# Patient Record
Sex: Male | Born: 1955 | Race: White | Hispanic: No | Marital: Single | Smoking: Former smoker
Health system: Southern US, Community
[De-identification: ages and names within clinical notes are randomized; demographics above are authoritative.]

## PROBLEM LIST (undated history)

## (undated) DIAGNOSIS — I10 Essential (primary) hypertension: Secondary | ICD-10-CM

## (undated) DIAGNOSIS — G629 Polyneuropathy, unspecified: Secondary | ICD-10-CM

## (undated) DIAGNOSIS — D649 Anemia, unspecified: Secondary | ICD-10-CM

## (undated) DIAGNOSIS — I2510 Atherosclerotic heart disease of native coronary artery without angina pectoris: Secondary | ICD-10-CM

## (undated) DIAGNOSIS — K219 Gastro-esophageal reflux disease without esophagitis: Secondary | ICD-10-CM

## (undated) DIAGNOSIS — M109 Gout, unspecified: Secondary | ICD-10-CM

## (undated) DIAGNOSIS — M2550 Pain in unspecified joint: Secondary | ICD-10-CM

## (undated) DIAGNOSIS — N183 Chronic kidney disease, stage 3 unspecified: Secondary | ICD-10-CM

## (undated) DIAGNOSIS — F32A Depression, unspecified: Secondary | ICD-10-CM

## (undated) DIAGNOSIS — R0600 Dyspnea, unspecified: Secondary | ICD-10-CM

## (undated) DIAGNOSIS — E785 Hyperlipidemia, unspecified: Secondary | ICD-10-CM

## (undated) DIAGNOSIS — R609 Edema, unspecified: Secondary | ICD-10-CM

## (undated) DIAGNOSIS — F329 Major depressive disorder, single episode, unspecified: Secondary | ICD-10-CM

## (undated) DIAGNOSIS — M1990 Unspecified osteoarthritis, unspecified site: Secondary | ICD-10-CM

## (undated) DIAGNOSIS — N289 Disorder of kidney and ureter, unspecified: Secondary | ICD-10-CM

## (undated) DIAGNOSIS — E118 Type 2 diabetes mellitus with unspecified complications: Secondary | ICD-10-CM

## (undated) DIAGNOSIS — E291 Testicular hypofunction: Secondary | ICD-10-CM

## (undated) DIAGNOSIS — C801 Malignant (primary) neoplasm, unspecified: Secondary | ICD-10-CM

## (undated) DIAGNOSIS — R809 Proteinuria, unspecified: Secondary | ICD-10-CM

## (undated) DIAGNOSIS — I5030 Unspecified diastolic (congestive) heart failure: Secondary | ICD-10-CM

## (undated) DIAGNOSIS — R252 Cramp and spasm: Secondary | ICD-10-CM

## (undated) DIAGNOSIS — E1129 Type 2 diabetes mellitus with other diabetic kidney complication: Secondary | ICD-10-CM

## (undated) DIAGNOSIS — E1140 Type 2 diabetes mellitus with diabetic neuropathy, unspecified: Secondary | ICD-10-CM

## (undated) DIAGNOSIS — N400 Enlarged prostate without lower urinary tract symptoms: Secondary | ICD-10-CM

## (undated) HISTORY — DX: Pain in unspecified joint: M25.50

## (undated) HISTORY — DX: Unspecified osteoarthritis, unspecified site: M19.90

## (undated) HISTORY — DX: Cramp and spasm: R25.2

## (undated) HISTORY — DX: Chronic kidney disease, stage 3 unspecified: N18.30

## (undated) HISTORY — DX: Atherosclerotic heart disease of native coronary artery without angina pectoris: I25.10

## (undated) HISTORY — DX: Benign prostatic hyperplasia without lower urinary tract symptoms: N40.0

## (undated) HISTORY — DX: Essential (primary) hypertension: I10

## (undated) HISTORY — DX: Edema, unspecified: R60.9

## (undated) HISTORY — DX: Gout, unspecified: M10.9

## (undated) HISTORY — DX: Polyneuropathy, unspecified: G62.9

## (undated) HISTORY — DX: Type 2 diabetes mellitus with unspecified complications: E11.8

## (undated) HISTORY — DX: Hyperlipidemia, unspecified: E78.5

## (undated) HISTORY — DX: Type 2 diabetes mellitus with other diabetic kidney complication: R80.9

## (undated) HISTORY — DX: Unspecified diastolic (congestive) heart failure: I50.30

## (undated) HISTORY — DX: Type 2 diabetes mellitus with diabetic neuropathy, unspecified: E11.40

## (undated) HISTORY — PX: CATARACT EXTRACTION: SUR2

## (undated) HISTORY — DX: Testicular hypofunction: E29.1

## (undated) HISTORY — DX: Disorder of kidney and ureter, unspecified: N28.9

## (undated) HISTORY — DX: Gastro-esophageal reflux disease without esophagitis: K21.9

## (undated) HISTORY — DX: Malignant (primary) neoplasm, unspecified: C80.1

## (undated) HISTORY — DX: Proteinuria, unspecified: E11.29

## (undated) HISTORY — DX: Depression, unspecified: F32.A

## (undated) HISTORY — DX: Anemia, unspecified: D64.9

---
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
## 1979-09-28 HISTORY — PX: CHOLECYSTECTOMY: SHX55

## 1995-09-28 HISTORY — PX: KIDNEY STONE SURGERY: SHX686

## 2009-02-10 ENCOUNTER — Emergency Department (HOSPITAL_COMMUNITY): Admission: EM | Admit: 2009-02-10 | Discharge: 2009-02-10 | Payer: Self-pay | Admitting: Emergency Medicine

## 2009-02-10 IMAGING — CT CT PELVIS W/O CM
2 of 3 series · 17 of 43 positions shown, 19 images · non-contrast
Comparison: None available.

CT ABDOMEN

CLINICAL DATA: Nausea.  Right lower quadrant pain.  History
urinary tract stones.

CT ABDOMEN AND PELVIS WITHOUT CONTRAST
TECHNIQUE: Multidetector CT imaging of the abdomen and pelvis was
performed following the standard protocol without intravenous
contrast.

[Series 4: lung windows · axial · 0.88mm/px · z∈[-170,-40]mm · 14 of 29 slices shown, 16 images]
[im 2/29  soft-tissue]
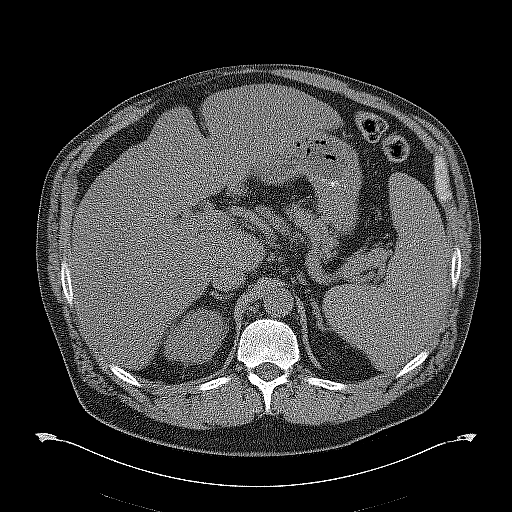
[im 2/29  bone]
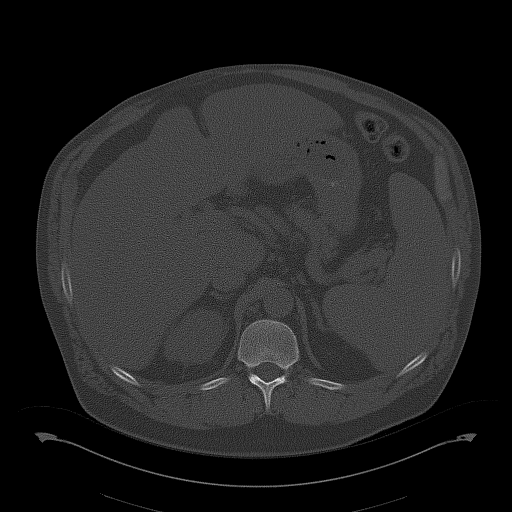
[im 4/29  soft-tissue]
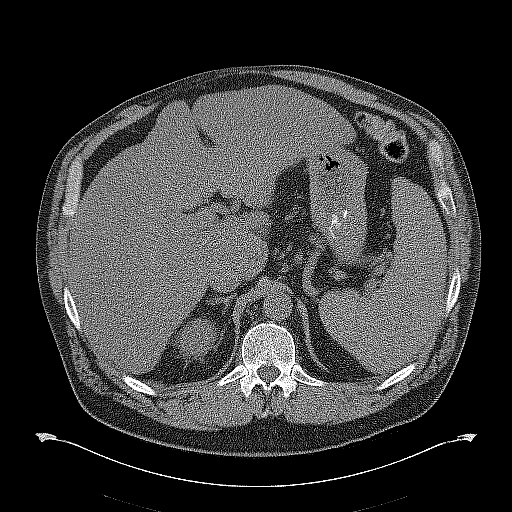
[im 6/29  soft-tissue]
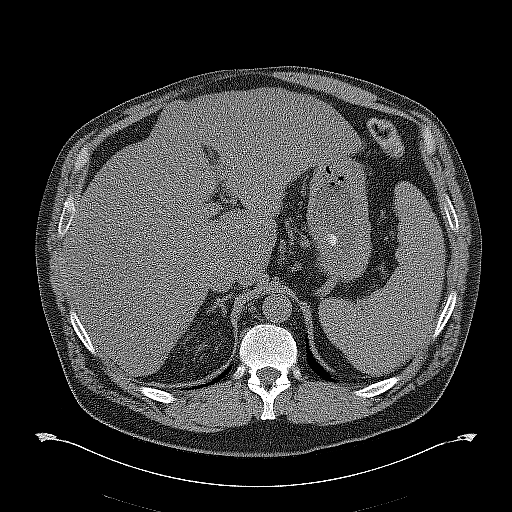
[im 8/29  soft-tissue]
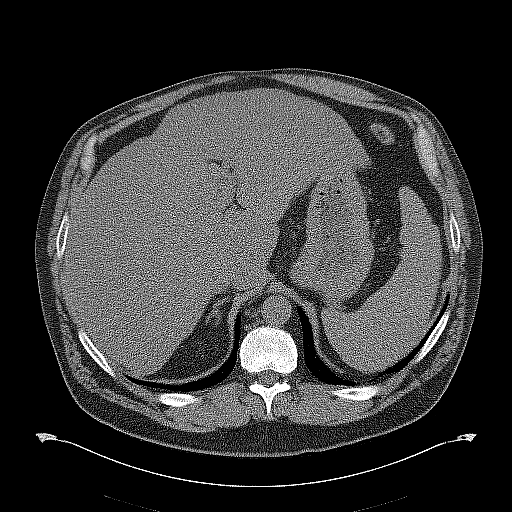
[im 10/29  soft-tissue]
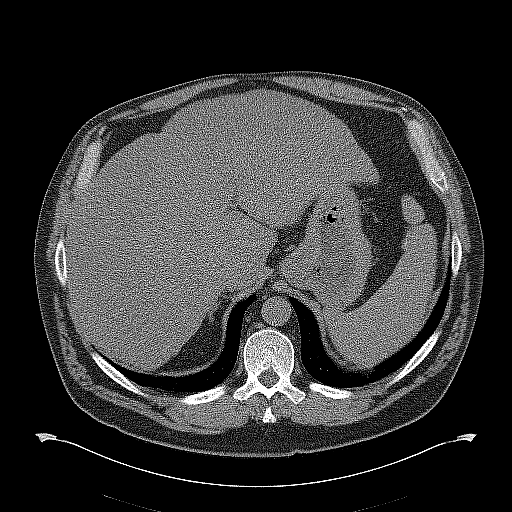
[im 12/29  soft-tissue]
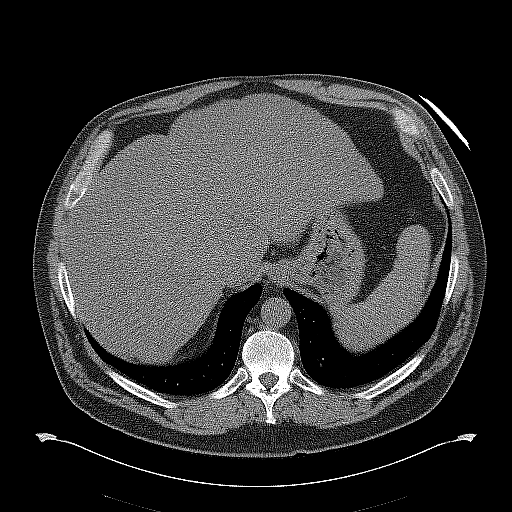
[im 14/29  soft-tissue]
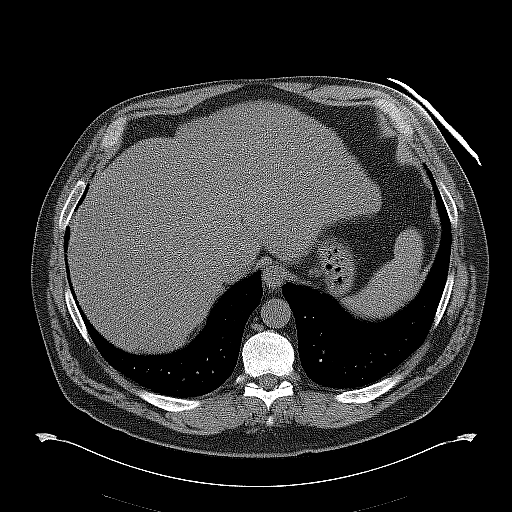
[im 16/29  soft-tissue]
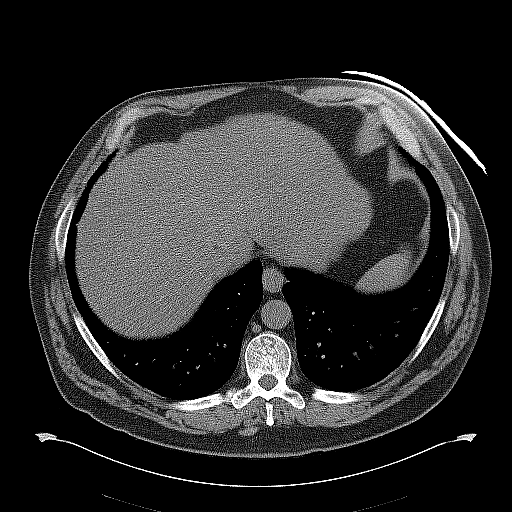
[im 18/29  soft-tissue]
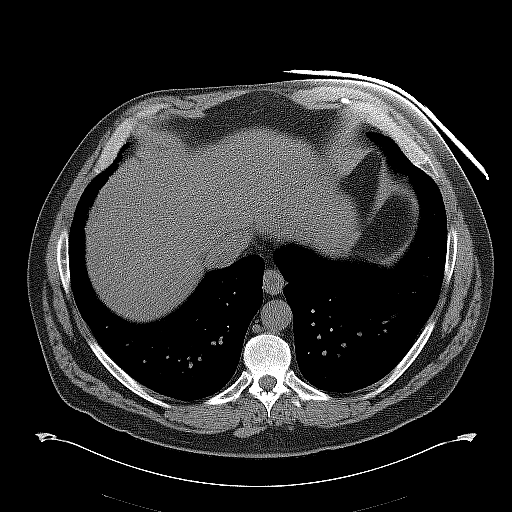
[im 18/29  bone]
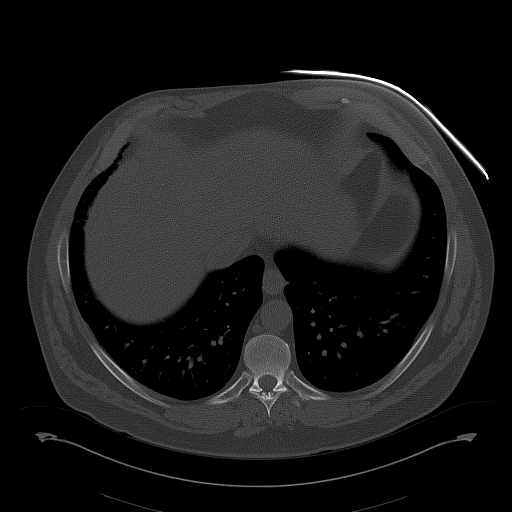
[im 20/29  soft-tissue]
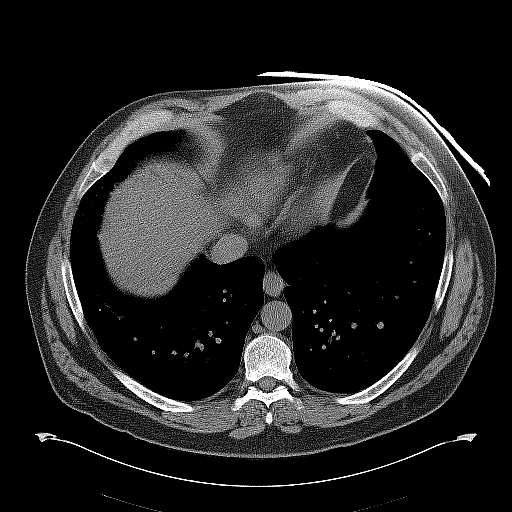
[im 22/29  soft-tissue]
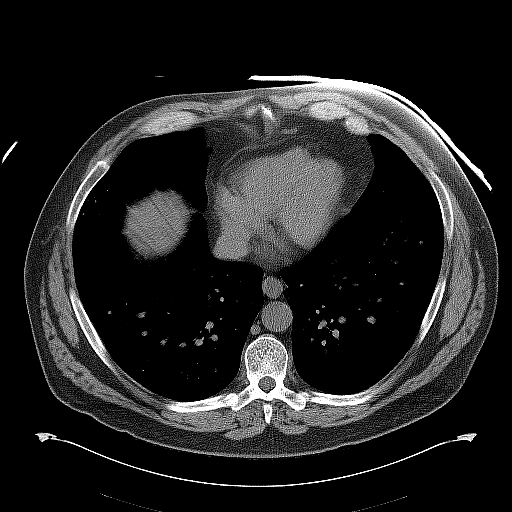
[im 24/29  soft-tissue]
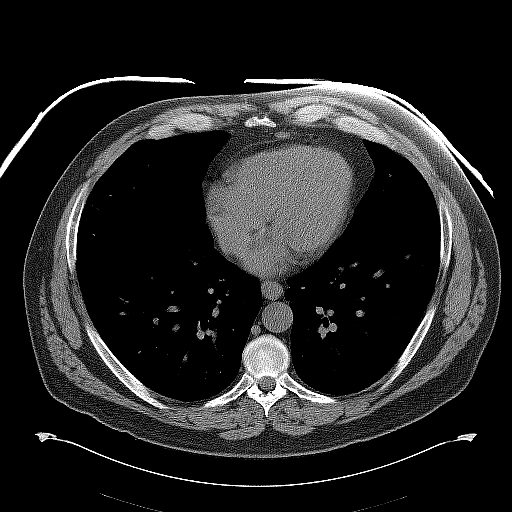
[im 26/29  soft-tissue]
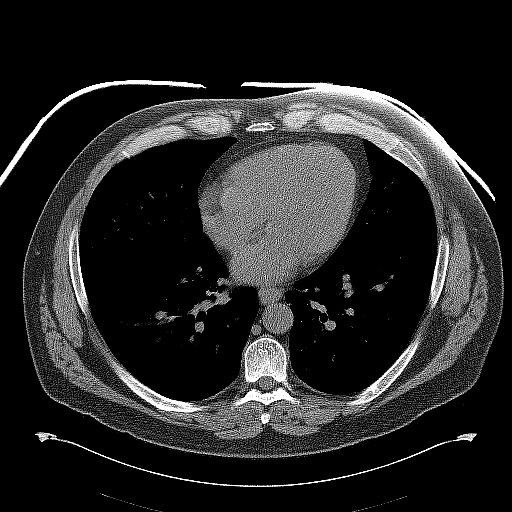
[im 28/29  soft-tissue]
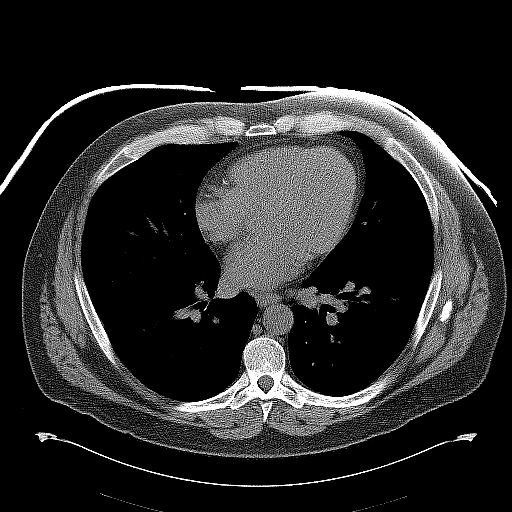

[Series 602: coronal images · coronal · 0.99mm/px · 3 of 121 slices shown]
[im 41/121  soft-tissue]
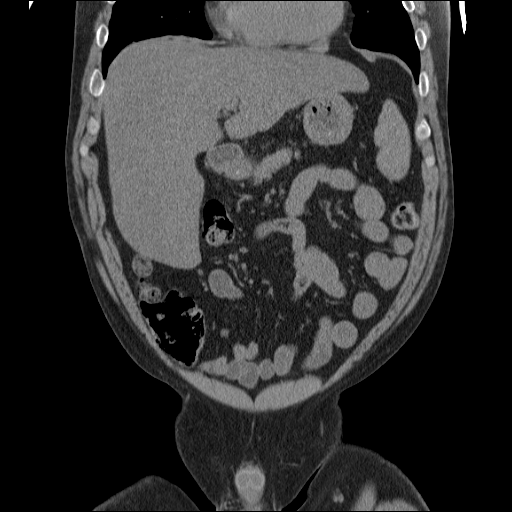
[im 54/121  soft-tissue]
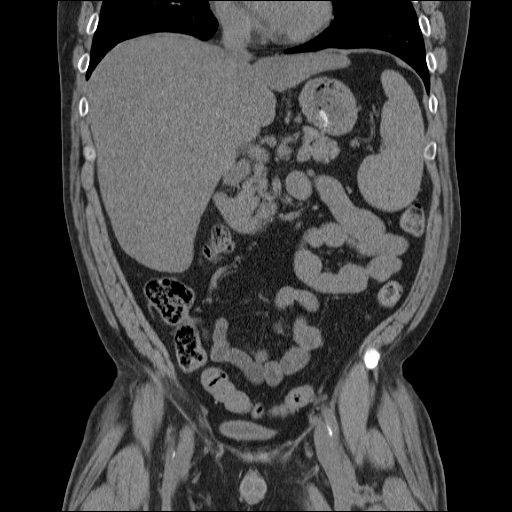
[im 67/121  soft-tissue]
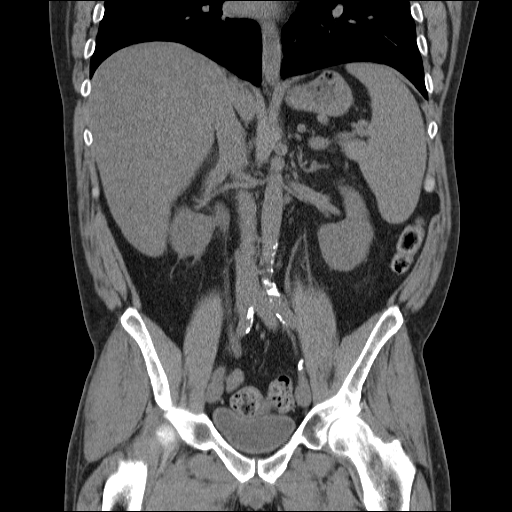

[17 of 43 positions shown; findings below may reference images not displayed]

FINDINGS: The lung bases are clear.  No pleural or pericardial
effusion.

There is moderate right hydroureteronephrosis due to a distal right
ureteral stone measuring 0.6 cm lying approximately 4 cm proximal
to the UVJ.  The patient has multiple additional nonobstructing
bilateral renal stones with approximately six on the right and five
on the left.  The largest is in the upper pole right kidney
measuring 0.6 cm.

The liver has a slightly nodular border and is enlarged measuring
24 cm cranial-caudal.  No focal liver lesion on uninfused scan.
The gallbladder has been removed.  The biliary tree, adrenal
glands, spleen and pancreas are all unremarkable.  Some small
abdominal lymph nodes are noted including a gastrohepatic ligament
lymph node on image 33 measures 1.6 cm.  Scattered atherosclerotic
calcification is seen in a nonaneurysmal aorta.  There is no focal
bony abnormality with degenerative disc disease seen the lumbar
spine most notable at L3-4.
IMPRESSION: 1.  Moderate right hydronephrosis due to a 0.6 cm distal right
ureteral stone.
2.  Multiple bilateral nonobstructing renal stones.
3.  Hepatomegaly with a slightly nodular border of the liver
suggesting cirrhosis.
4.  Atherosclerotic vascular disease.

CT PELVIS
FINDINGS: The urinary bladder, prostate gland and seminal vesicles
are unremarkable.  No pelvic fluid or lymphadenopathy.  The colon
has a normal CT appearance.  The appendix is not discretely
visualized.  No inflammatory change is seen in the right lower
quadrant.  No focal bony abnormality.
IMPRESSION: Distal right ureteral stone is again noted.  The pelvis is
otherwise negative.

## 2011-01-05 LAB — CBC
Hemoglobin: 13.3 g/dL (ref 13.0–17.0)
MCHC: 34.9 g/dL (ref 30.0–36.0)
MCV: 94.4 fL (ref 78.0–100.0)
RBC: 4.02 MIL/uL — ABNORMAL LOW (ref 4.22–5.81)
WBC: 7.1 10*3/uL (ref 4.0–10.5)

## 2011-01-05 LAB — BASIC METABOLIC PANEL
CO2: 22 mEq/L (ref 19–32)
Chloride: 102 mEq/L (ref 96–112)
GFR calc Af Amer: >60 mL/min (ref >60)
Sodium: 133 mEq/L — ABNORMAL LOW (ref 135–145)

## 2011-01-05 LAB — DIFFERENTIAL
Basophils Relative: 1 % (ref 0–1)
Eosinophils Absolute: 0.1 10*3/uL (ref 0.0–0.7)
Lymphs Abs: 1.5 10*3/uL (ref 0.7–4.0)
Monocytes Absolute: 0.4 10*3/uL (ref 0.1–1.0)
Monocytes Relative: 5 % (ref 3–12)
Neutro Abs: 5.1 10*3/uL (ref 1.7–7.7)

## 2011-01-05 LAB — URINALYSIS, ROUTINE W REFLEX MICROSCOPIC
Bilirubin Urine: NEGATIVE
Ketones, ur: NEGATIVE mg/dL
Protein, ur: 100 mg/dL — AB
Urobilinogen, UA: 1 mg/dL (ref 0.0–1.0)

## 2015-01-02 DIAGNOSIS — D61818 Other pancytopenia: Secondary | ICD-10-CM

## 2015-01-02 HISTORY — DX: Other pancytopenia: D61.818

## 2017-10-29 DIAGNOSIS — F0390 Unspecified dementia without behavioral disturbance: Secondary | ICD-10-CM

## 2017-10-29 DIAGNOSIS — E039 Hypothyroidism, unspecified: Secondary | ICD-10-CM

## 2017-10-29 DIAGNOSIS — K766 Portal hypertension: Secondary | ICD-10-CM

## 2017-10-29 DIAGNOSIS — I130 Hypertensive heart and chronic kidney disease with heart failure and stage 1 through stage 4 chronic kidney disease, or unspecified chronic kidney disease: Secondary | ICD-10-CM

## 2017-10-29 DIAGNOSIS — R569 Unspecified convulsions: Secondary | ICD-10-CM

## 2017-10-29 DIAGNOSIS — N179 Acute kidney failure, unspecified: Secondary | ICD-10-CM

## 2017-10-29 DIAGNOSIS — E785 Hyperlipidemia, unspecified: Secondary | ICD-10-CM

## 2017-10-29 DIAGNOSIS — I5031 Acute diastolic (congestive) heart failure: Secondary | ICD-10-CM

## 2017-10-29 DIAGNOSIS — J9691 Respiratory failure, unspecified with hypoxia: Secondary | ICD-10-CM

## 2017-10-29 DIAGNOSIS — E1122 Type 2 diabetes mellitus with diabetic chronic kidney disease: Secondary | ICD-10-CM

## 2017-10-29 DIAGNOSIS — J111 Influenza due to unidentified influenza virus with other respiratory manifestations: Secondary | ICD-10-CM

## 2017-10-29 DIAGNOSIS — K921 Melena: Secondary | ICD-10-CM

## 2017-10-30 DIAGNOSIS — R0602 Shortness of breath: Secondary | ICD-10-CM

## 2018-09-28 DIAGNOSIS — Z794 Long term (current) use of insulin: Secondary | ICD-10-CM | POA: Diagnosis not present

## 2018-09-28 DIAGNOSIS — E113593 Type 2 diabetes mellitus with proliferative diabetic retinopathy without macular edema, bilateral: Secondary | ICD-10-CM | POA: Diagnosis not present

## 2018-10-13 DIAGNOSIS — L300 Nummular dermatitis: Secondary | ICD-10-CM | POA: Diagnosis not present

## 2018-10-13 DIAGNOSIS — C44519 Basal cell carcinoma of skin of other part of trunk: Secondary | ICD-10-CM | POA: Diagnosis not present

## 2018-10-13 DIAGNOSIS — L821 Other seborrheic keratosis: Secondary | ICD-10-CM | POA: Diagnosis not present

## 2018-10-13 DIAGNOSIS — L570 Actinic keratosis: Secondary | ICD-10-CM | POA: Diagnosis not present

## 2018-10-16 ENCOUNTER — Ambulatory Visit (INDEPENDENT_AMBULATORY_CARE_PROVIDER_SITE_OTHER): Payer: Commercial Managed Care - PPO | Admitting: Podiatry

## 2018-10-16 ENCOUNTER — Other Ambulatory Visit: Payer: Self-pay

## 2018-10-16 ENCOUNTER — Encounter: Payer: Self-pay | Admitting: Podiatry

## 2018-10-16 VITALS — BP 106/67 | HR 79 | Resp 16 | Ht 70.0 in | Wt 240.0 lb

## 2018-10-16 DIAGNOSIS — M7741 Metatarsalgia, right foot: Secondary | ICD-10-CM | POA: Diagnosis not present

## 2018-10-16 DIAGNOSIS — B351 Tinea unguium: Secondary | ICD-10-CM | POA: Diagnosis not present

## 2018-10-16 DIAGNOSIS — M7742 Metatarsalgia, left foot: Secondary | ICD-10-CM

## 2018-10-16 DIAGNOSIS — M79676 Pain in unspecified toe(s): Secondary | ICD-10-CM | POA: Diagnosis not present

## 2018-10-16 DIAGNOSIS — M79609 Pain in unspecified limb: Secondary | ICD-10-CM

## 2018-10-16 DIAGNOSIS — E119 Type 2 diabetes mellitus without complications: Secondary | ICD-10-CM | POA: Diagnosis not present

## 2018-10-16 NOTE — Progress Notes (Signed)
  Subjective:  Patient ID: Nicholas Rice, male    DOB: July 01, 1956,  MRN: [id number]  Chief Complaint  Patient presents with  . debride    BL nail trimming and foot examination  . Diabetes    FBS: 165 x 1 day A1C: >7 PCP: Lee x 1 mo    63 y.o. male presents  for diabetic foot care. Last AMBS was 165. Denies numbness and tingling in their feet. Reports cramping in legs and thighs.  Review of Systems: Negative except as noted in the HPI. Denies N/V/F/Ch.  No past medical history on file.  Current Outpatient Medications:  .  atorvastatin (LIPITOR) 40 MG tablet, Take 40 mg by mouth daily., Disp: , Rfl:  .  furosemide (LASIX) 40 MG tablet, Take 40 mg by mouth 2 (two) times daily., Disp: , Rfl:  .  glipiZIDE (GLUCOTROL XL) 10 MG 24 hr tablet, Take 10 mg by mouth 2 (two) times daily., Disp: , Rfl:  .  lisinopril-hydrochlorothiazide (PRINZIDE,ZESTORETIC) 20-12.5 MG tablet, Take 1 tablet by mouth daily., Disp: , Rfl:  .  meloxicam (MOBIC) 15 MG tablet, TAKE 1 TABLET BY MOUTH ONCE DAILY WITH MEALS AS NEEDED, Disp: , Rfl:  .  metFORMIN (GLUCOPHAGE) 850 MG tablet, Take 850 mg by mouth 3 (three) times daily., Disp: , Rfl:  .  NOVOLIN N RELION 100 UNIT/ML injection, INJECT 80 UNITS SUBCUTANEOUSLY TWICE DAILY, Disp: , Rfl:  .  pantoprazole (PROTONIX) 40 MG tablet, Take 40 mg by mouth daily., Disp: , Rfl:  .  tamsulosin (FLOMAX) 0.4 MG CAPS capsule, Take 0.4 mg by mouth daily., Disp: , Rfl:  .  traMADol (ULTRAM) 50 MG tablet, Take 50 mg by mouth 3 (three) times daily as needed., Disp: , Rfl:   Social History   Tobacco Use  Smoking Status Former Smoker  Smokeless Tobacco Never Used    Allergies  Allergen Reactions  . Loratadine Other (See Comments)   Objective:   Vitals:   10/16/18 1012  BP: 106/67  Pulse: 79  Resp: 16   Body mass index is 34.44 kg/m. Constitutional Well developed. Well nourished.  Vascular Dorsalis pedis pulses present 1+ bilaterally  Posterior tibial pulses  present 1+ bilaterally  Pedal hair growth diminished. Capillary refill normal to all digits.  No cyanosis or clubbing noted.  Neurologic Normal speech. Oriented to person, place, and time. Epicritic sensation to light touch grossly present bilaterally. Protective sensation with 5.07 monofilament  present bilaterally. Vibratory sensation present bilaterally.  Dermatologic Nails elongated, thickened, dystrophic. No open wounds. No skin lesions.  Orthopedic: Normal joint ROM without pain or crepitus bilaterally. No visible deformities. No bony tenderness.   Assessment:   1. Encounter for diabetic foot exam ([place])   2. Controlled type 2 diabetes mellitus without complication, without long-term current use of insulin (HCC)   3. Pain due to onychomycosis of nail   4. Metatarsalgia of both feet    Plan:  Patient was evaluated and treated and all questions answered.  Diabetes without complication, Onychomycosis -Educated on diabetic footcare. Diabetic risk level 0 -Nails x10 debrided sharply and manually with large nail nipper and rotary burr.   Procedure: Nail Debridement Rationale: pain Type of Debridement: manual, sharp debridement. Instrumentation: Nail nipper, rotary burr. Number of Nails: 10  Return in about 1 year (around 10/17/2019) for Annual DM Foot Exam.

## 2018-10-16 NOTE — Progress Notes (Signed)
   Subjective:    Patient ID: Nicholas Rice, male    DOB: 04/03/1956, 63 y.o.   MRN: [id number]  HPI    Review of Systems  All other systems reviewed and are negative.      Objective:   Physical Exam        Assessment & Plan:

## 2018-11-10 DIAGNOSIS — C44519 Basal cell carcinoma of skin of other part of trunk: Secondary | ICD-10-CM | POA: Diagnosis not present

## 2018-11-11 DIAGNOSIS — N183 Chronic kidney disease, stage 3 (moderate): Secondary | ICD-10-CM | POA: Diagnosis not present

## 2018-11-11 DIAGNOSIS — E1140 Type 2 diabetes mellitus with diabetic neuropathy, unspecified: Secondary | ICD-10-CM | POA: Diagnosis not present

## 2018-11-11 DIAGNOSIS — E1129 Type 2 diabetes mellitus with other diabetic kidney complication: Secondary | ICD-10-CM | POA: Diagnosis not present

## 2018-11-11 DIAGNOSIS — E291 Testicular hypofunction: Secondary | ICD-10-CM | POA: Diagnosis not present

## 2018-11-11 DIAGNOSIS — I10 Essential (primary) hypertension: Secondary | ICD-10-CM | POA: Diagnosis not present

## 2018-12-01 DIAGNOSIS — L578 Other skin changes due to chronic exposure to nonionizing radiation: Secondary | ICD-10-CM | POA: Diagnosis not present

## 2018-12-01 DIAGNOSIS — C44329 Squamous cell carcinoma of skin of other parts of face: Secondary | ICD-10-CM | POA: Diagnosis not present

## 2018-12-01 DIAGNOSIS — C44629 Squamous cell carcinoma of skin of left upper limb, including shoulder: Secondary | ICD-10-CM | POA: Diagnosis not present

## 2018-12-01 DIAGNOSIS — L570 Actinic keratosis: Secondary | ICD-10-CM | POA: Diagnosis not present

## 2018-12-06 DIAGNOSIS — C44629 Squamous cell carcinoma of skin of left upper limb, including shoulder: Secondary | ICD-10-CM | POA: Diagnosis not present

## 2018-12-11 DIAGNOSIS — L02414 Cutaneous abscess of left upper limb: Secondary | ICD-10-CM | POA: Diagnosis not present

## 2018-12-16 DIAGNOSIS — E1129 Type 2 diabetes mellitus with other diabetic kidney complication: Secondary | ICD-10-CM | POA: Diagnosis not present

## 2018-12-16 DIAGNOSIS — N183 Chronic kidney disease, stage 3 (moderate): Secondary | ICD-10-CM | POA: Diagnosis not present

## 2018-12-16 DIAGNOSIS — L0390 Cellulitis, unspecified: Secondary | ICD-10-CM | POA: Diagnosis not present

## 2018-12-22 DIAGNOSIS — E118 Type 2 diabetes mellitus with unspecified complications: Secondary | ICD-10-CM | POA: Diagnosis not present

## 2018-12-22 DIAGNOSIS — I10 Essential (primary) hypertension: Secondary | ICD-10-CM | POA: Diagnosis not present

## 2018-12-22 DIAGNOSIS — L0390 Cellulitis, unspecified: Secondary | ICD-10-CM | POA: Diagnosis not present

## 2018-12-22 DIAGNOSIS — M159 Polyosteoarthritis, unspecified: Secondary | ICD-10-CM | POA: Diagnosis not present

## 2018-12-22 DIAGNOSIS — E785 Hyperlipidemia, unspecified: Secondary | ICD-10-CM | POA: Diagnosis not present

## 2018-12-22 DIAGNOSIS — E1140 Type 2 diabetes mellitus with diabetic neuropathy, unspecified: Secondary | ICD-10-CM | POA: Diagnosis not present

## 2018-12-28 DIAGNOSIS — N183 Chronic kidney disease, stage 3 (moderate): Secondary | ICD-10-CM | POA: Diagnosis not present

## 2018-12-28 DIAGNOSIS — E1140 Type 2 diabetes mellitus with diabetic neuropathy, unspecified: Secondary | ICD-10-CM | POA: Diagnosis not present

## 2018-12-28 DIAGNOSIS — L0390 Cellulitis, unspecified: Secondary | ICD-10-CM | POA: Diagnosis not present

## 2019-01-02 DIAGNOSIS — N400 Enlarged prostate without lower urinary tract symptoms: Secondary | ICD-10-CM | POA: Diagnosis not present

## 2019-01-02 DIAGNOSIS — E1140 Type 2 diabetes mellitus with diabetic neuropathy, unspecified: Secondary | ICD-10-CM | POA: Diagnosis not present

## 2019-01-02 DIAGNOSIS — L0390 Cellulitis, unspecified: Secondary | ICD-10-CM | POA: Diagnosis not present

## 2019-12-27 DIAGNOSIS — E1140 Type 2 diabetes mellitus with diabetic neuropathy, unspecified: Secondary | ICD-10-CM | POA: Diagnosis not present

## 2019-12-27 DIAGNOSIS — N183 Chronic kidney disease, stage 3 unspecified: Secondary | ICD-10-CM | POA: Diagnosis not present

## 2019-12-27 DIAGNOSIS — R252 Cramp and spasm: Secondary | ICD-10-CM | POA: Diagnosis not present

## 2019-12-27 DIAGNOSIS — I2510 Atherosclerotic heart disease of native coronary artery without angina pectoris: Secondary | ICD-10-CM | POA: Diagnosis not present

## 2019-12-27 DIAGNOSIS — N400 Enlarged prostate without lower urinary tract symptoms: Secondary | ICD-10-CM | POA: Diagnosis not present

## 2019-12-27 DIAGNOSIS — N289 Disorder of kidney and ureter, unspecified: Secondary | ICD-10-CM | POA: Diagnosis not present

## 2019-12-27 DIAGNOSIS — I5030 Unspecified diastolic (congestive) heart failure: Secondary | ICD-10-CM | POA: Diagnosis not present

## 2019-12-27 DIAGNOSIS — F329 Major depressive disorder, single episode, unspecified: Secondary | ICD-10-CM | POA: Diagnosis not present

## 2019-12-27 DIAGNOSIS — E1129 Type 2 diabetes mellitus with other diabetic kidney complication: Secondary | ICD-10-CM | POA: Diagnosis not present

## 2020-01-10 DIAGNOSIS — I10 Essential (primary) hypertension: Secondary | ICD-10-CM | POA: Diagnosis not present

## 2020-01-10 DIAGNOSIS — R252 Cramp and spasm: Secondary | ICD-10-CM | POA: Diagnosis not present

## 2020-01-10 DIAGNOSIS — I2510 Atherosclerotic heart disease of native coronary artery without angina pectoris: Secondary | ICD-10-CM | POA: Diagnosis not present

## 2020-01-10 DIAGNOSIS — N289 Disorder of kidney and ureter, unspecified: Secondary | ICD-10-CM | POA: Diagnosis not present

## 2020-01-10 DIAGNOSIS — E118 Type 2 diabetes mellitus with unspecified complications: Secondary | ICD-10-CM | POA: Diagnosis not present

## 2020-01-10 DIAGNOSIS — E1140 Type 2 diabetes mellitus with diabetic neuropathy, unspecified: Secondary | ICD-10-CM | POA: Diagnosis not present

## 2020-01-10 DIAGNOSIS — K921 Melena: Secondary | ICD-10-CM | POA: Diagnosis not present

## 2020-01-10 DIAGNOSIS — N400 Enlarged prostate without lower urinary tract symptoms: Secondary | ICD-10-CM | POA: Diagnosis not present

## 2020-01-10 DIAGNOSIS — N183 Chronic kidney disease, stage 3 unspecified: Secondary | ICD-10-CM | POA: Diagnosis not present

## 2020-01-10 DIAGNOSIS — F329 Major depressive disorder, single episode, unspecified: Secondary | ICD-10-CM | POA: Diagnosis not present

## 2020-01-10 DIAGNOSIS — E1129 Type 2 diabetes mellitus with other diabetic kidney complication: Secondary | ICD-10-CM | POA: Diagnosis not present

## 2020-01-14 DIAGNOSIS — E1140 Type 2 diabetes mellitus with diabetic neuropathy, unspecified: Secondary | ICD-10-CM | POA: Diagnosis not present

## 2020-01-14 DIAGNOSIS — N183 Chronic kidney disease, stage 3 unspecified: Secondary | ICD-10-CM | POA: Diagnosis not present

## 2020-01-14 DIAGNOSIS — F329 Major depressive disorder, single episode, unspecified: Secondary | ICD-10-CM | POA: Diagnosis not present

## 2020-01-14 DIAGNOSIS — N400 Enlarged prostate without lower urinary tract symptoms: Secondary | ICD-10-CM | POA: Diagnosis not present

## 2020-01-14 DIAGNOSIS — K921 Melena: Secondary | ICD-10-CM | POA: Diagnosis not present

## 2020-01-14 DIAGNOSIS — N289 Disorder of kidney and ureter, unspecified: Secondary | ICD-10-CM | POA: Diagnosis not present

## 2020-01-14 DIAGNOSIS — R252 Cramp and spasm: Secondary | ICD-10-CM | POA: Diagnosis not present

## 2020-01-14 DIAGNOSIS — I2510 Atherosclerotic heart disease of native coronary artery without angina pectoris: Secondary | ICD-10-CM | POA: Diagnosis not present

## 2020-01-14 DIAGNOSIS — E1129 Type 2 diabetes mellitus with other diabetic kidney complication: Secondary | ICD-10-CM | POA: Diagnosis not present

## 2020-01-16 DIAGNOSIS — E1140 Type 2 diabetes mellitus with diabetic neuropathy, unspecified: Secondary | ICD-10-CM | POA: Diagnosis not present

## 2020-01-16 DIAGNOSIS — E1129 Type 2 diabetes mellitus with other diabetic kidney complication: Secondary | ICD-10-CM | POA: Diagnosis not present

## 2020-01-16 DIAGNOSIS — N400 Enlarged prostate without lower urinary tract symptoms: Secondary | ICD-10-CM | POA: Diagnosis not present

## 2020-01-16 DIAGNOSIS — N183 Chronic kidney disease, stage 3 unspecified: Secondary | ICD-10-CM | POA: Diagnosis not present

## 2020-01-16 DIAGNOSIS — I2510 Atherosclerotic heart disease of native coronary artery without angina pectoris: Secondary | ICD-10-CM | POA: Diagnosis not present

## 2020-01-16 DIAGNOSIS — F329 Major depressive disorder, single episode, unspecified: Secondary | ICD-10-CM | POA: Diagnosis not present

## 2020-01-16 DIAGNOSIS — E118 Type 2 diabetes mellitus with unspecified complications: Secondary | ICD-10-CM | POA: Diagnosis not present

## 2020-01-16 DIAGNOSIS — N289 Disorder of kidney and ureter, unspecified: Secondary | ICD-10-CM | POA: Diagnosis not present

## 2020-01-16 DIAGNOSIS — R252 Cramp and spasm: Secondary | ICD-10-CM | POA: Diagnosis not present

## 2020-01-17 DIAGNOSIS — H25812 Combined forms of age-related cataract, left eye: Secondary | ICD-10-CM | POA: Diagnosis not present

## 2020-01-17 DIAGNOSIS — H2512 Age-related nuclear cataract, left eye: Secondary | ICD-10-CM | POA: Diagnosis not present

## 2020-01-17 DIAGNOSIS — H25813 Combined forms of age-related cataract, bilateral: Secondary | ICD-10-CM | POA: Diagnosis not present

## 2020-01-17 DIAGNOSIS — Z01818 Encounter for other preprocedural examination: Secondary | ICD-10-CM | POA: Diagnosis not present

## 2020-01-17 DIAGNOSIS — E119 Type 2 diabetes mellitus without complications: Secondary | ICD-10-CM | POA: Diagnosis not present

## 2020-01-17 DIAGNOSIS — H25811 Combined forms of age-related cataract, right eye: Secondary | ICD-10-CM | POA: Diagnosis not present

## 2020-01-23 DIAGNOSIS — R809 Proteinuria, unspecified: Secondary | ICD-10-CM | POA: Diagnosis not present

## 2020-01-23 DIAGNOSIS — F329 Major depressive disorder, single episode, unspecified: Secondary | ICD-10-CM | POA: Diagnosis not present

## 2020-01-23 DIAGNOSIS — I2510 Atherosclerotic heart disease of native coronary artery without angina pectoris: Secondary | ICD-10-CM | POA: Diagnosis not present

## 2020-01-23 DIAGNOSIS — N183 Chronic kidney disease, stage 3 unspecified: Secondary | ICD-10-CM | POA: Diagnosis not present

## 2020-01-23 DIAGNOSIS — N400 Enlarged prostate without lower urinary tract symptoms: Secondary | ICD-10-CM | POA: Diagnosis not present

## 2020-01-23 DIAGNOSIS — N289 Disorder of kidney and ureter, unspecified: Secondary | ICD-10-CM | POA: Diagnosis not present

## 2020-01-23 DIAGNOSIS — R252 Cramp and spasm: Secondary | ICD-10-CM | POA: Diagnosis not present

## 2020-01-23 DIAGNOSIS — E118 Type 2 diabetes mellitus with unspecified complications: Secondary | ICD-10-CM | POA: Diagnosis not present

## 2020-01-23 DIAGNOSIS — E1129 Type 2 diabetes mellitus with other diabetic kidney complication: Secondary | ICD-10-CM | POA: Diagnosis not present

## 2020-01-24 DIAGNOSIS — H25812 Combined forms of age-related cataract, left eye: Secondary | ICD-10-CM | POA: Diagnosis not present

## 2020-02-07 DIAGNOSIS — E1129 Type 2 diabetes mellitus with other diabetic kidney complication: Secondary | ICD-10-CM | POA: Diagnosis not present

## 2020-02-07 DIAGNOSIS — R252 Cramp and spasm: Secondary | ICD-10-CM | POA: Diagnosis not present

## 2020-02-07 DIAGNOSIS — E1140 Type 2 diabetes mellitus with diabetic neuropathy, unspecified: Secondary | ICD-10-CM | POA: Diagnosis not present

## 2020-02-07 DIAGNOSIS — N400 Enlarged prostate without lower urinary tract symptoms: Secondary | ICD-10-CM | POA: Diagnosis not present

## 2020-02-07 DIAGNOSIS — F3341 Major depressive disorder, recurrent, in partial remission: Secondary | ICD-10-CM | POA: Diagnosis not present

## 2020-02-07 DIAGNOSIS — N289 Disorder of kidney and ureter, unspecified: Secondary | ICD-10-CM | POA: Diagnosis not present

## 2020-02-07 DIAGNOSIS — I2510 Atherosclerotic heart disease of native coronary artery without angina pectoris: Secondary | ICD-10-CM | POA: Diagnosis not present

## 2020-02-07 DIAGNOSIS — N183 Chronic kidney disease, stage 3 unspecified: Secondary | ICD-10-CM | POA: Diagnosis not present

## 2020-02-07 DIAGNOSIS — E118 Type 2 diabetes mellitus with unspecified complications: Secondary | ICD-10-CM | POA: Diagnosis not present

## 2020-02-21 DIAGNOSIS — Z6837 Body mass index (BMI) 37.0-37.9, adult: Secondary | ICD-10-CM | POA: Diagnosis not present

## 2020-02-21 DIAGNOSIS — E1129 Type 2 diabetes mellitus with other diabetic kidney complication: Secondary | ICD-10-CM | POA: Diagnosis not present

## 2020-02-21 DIAGNOSIS — E1140 Type 2 diabetes mellitus with diabetic neuropathy, unspecified: Secondary | ICD-10-CM | POA: Diagnosis not present

## 2020-02-21 DIAGNOSIS — N400 Enlarged prostate without lower urinary tract symptoms: Secondary | ICD-10-CM | POA: Diagnosis not present

## 2020-02-21 DIAGNOSIS — E118 Type 2 diabetes mellitus with unspecified complications: Secondary | ICD-10-CM | POA: Diagnosis not present

## 2020-02-21 DIAGNOSIS — E669 Obesity, unspecified: Secondary | ICD-10-CM | POA: Diagnosis not present

## 2020-02-21 DIAGNOSIS — N183 Chronic kidney disease, stage 3 unspecified: Secondary | ICD-10-CM | POA: Diagnosis not present

## 2020-04-04 DIAGNOSIS — L02411 Cutaneous abscess of right axilla: Secondary | ICD-10-CM | POA: Diagnosis not present

## 2020-04-11 DIAGNOSIS — H25811 Combined forms of age-related cataract, right eye: Secondary | ICD-10-CM | POA: Diagnosis not present

## 2020-04-11 DIAGNOSIS — H2511 Age-related nuclear cataract, right eye: Secondary | ICD-10-CM | POA: Diagnosis not present

## 2020-04-26 DIAGNOSIS — R809 Proteinuria, unspecified: Secondary | ICD-10-CM | POA: Diagnosis not present

## 2020-04-26 DIAGNOSIS — R600 Localized edema: Secondary | ICD-10-CM | POA: Diagnosis not present

## 2020-04-26 DIAGNOSIS — I10 Essential (primary) hypertension: Secondary | ICD-10-CM | POA: Diagnosis not present

## 2020-04-26 DIAGNOSIS — F0390 Unspecified dementia without behavioral disturbance: Secondary | ICD-10-CM | POA: Diagnosis not present

## 2020-04-26 DIAGNOSIS — E118 Type 2 diabetes mellitus with unspecified complications: Secondary | ICD-10-CM | POA: Diagnosis not present

## 2020-04-26 DIAGNOSIS — N183 Chronic kidney disease, stage 3 unspecified: Secondary | ICD-10-CM | POA: Diagnosis not present

## 2020-04-26 DIAGNOSIS — R300 Dysuria: Secondary | ICD-10-CM | POA: Diagnosis not present

## 2020-04-26 DIAGNOSIS — E785 Hyperlipidemia, unspecified: Secondary | ICD-10-CM | POA: Diagnosis not present

## 2020-04-26 DIAGNOSIS — E1129 Type 2 diabetes mellitus with other diabetic kidney complication: Secondary | ICD-10-CM | POA: Diagnosis not present

## 2020-04-26 DIAGNOSIS — E1140 Type 2 diabetes mellitus with diabetic neuropathy, unspecified: Secondary | ICD-10-CM | POA: Diagnosis not present

## 2020-04-26 DIAGNOSIS — I5030 Unspecified diastolic (congestive) heart failure: Secondary | ICD-10-CM | POA: Diagnosis not present

## 2020-05-05 ENCOUNTER — Other Ambulatory Visit: Payer: Self-pay

## 2020-05-05 LAB — LIPID PANEL W/REFLEX DIRECT LDL
Cholesterol: 111
HDL: 34
LDL: 27
Triglycerides: 345 — AB (ref 40–160)
VLDL: 50 mg/dL

## 2020-05-05 LAB — HEMOGLOBIN A1C: A1c: 10.3

## 2020-05-05 LAB — ALBUMIN, URINE, RANDOM: Albumin ELP, Urine: 21.8

## 2020-05-05 LAB — MICROALBUMIN / CREATININE URINE RATIO: Microalb Creat Ratio: 83.1

## 2020-05-07 ENCOUNTER — Encounter: Payer: Self-pay | Admitting: General Practice

## 2020-05-10 DIAGNOSIS — E1129 Type 2 diabetes mellitus with other diabetic kidney complication: Secondary | ICD-10-CM | POA: Diagnosis not present

## 2020-05-10 DIAGNOSIS — N400 Enlarged prostate without lower urinary tract symptoms: Secondary | ICD-10-CM | POA: Diagnosis not present

## 2020-05-10 DIAGNOSIS — E1140 Type 2 diabetes mellitus with diabetic neuropathy, unspecified: Secondary | ICD-10-CM | POA: Diagnosis not present

## 2020-05-10 DIAGNOSIS — R252 Cramp and spasm: Secondary | ICD-10-CM | POA: Diagnosis not present

## 2020-05-10 DIAGNOSIS — Z9181 History of falling: Secondary | ICD-10-CM | POA: Diagnosis not present

## 2020-05-10 DIAGNOSIS — I5030 Unspecified diastolic (congestive) heart failure: Secondary | ICD-10-CM | POA: Diagnosis not present

## 2020-05-10 DIAGNOSIS — R69 Illness, unspecified: Secondary | ICD-10-CM | POA: Diagnosis not present

## 2020-05-10 DIAGNOSIS — N183 Chronic kidney disease, stage 3 unspecified: Secondary | ICD-10-CM | POA: Diagnosis not present

## 2020-05-10 DIAGNOSIS — R809 Proteinuria, unspecified: Secondary | ICD-10-CM | POA: Diagnosis not present

## 2020-05-10 DIAGNOSIS — R600 Localized edema: Secondary | ICD-10-CM | POA: Diagnosis not present

## 2020-05-13 ENCOUNTER — Encounter: Payer: Self-pay | Admitting: *Deleted

## 2020-05-13 ENCOUNTER — Encounter: Payer: Self-pay | Admitting: Cardiology

## 2020-05-13 DIAGNOSIS — I2510 Atherosclerotic heart disease of native coronary artery without angina pectoris: Secondary | ICD-10-CM | POA: Insufficient documentation

## 2020-05-13 DIAGNOSIS — M1990 Unspecified osteoarthritis, unspecified site: Secondary | ICD-10-CM | POA: Insufficient documentation

## 2020-05-13 DIAGNOSIS — N289 Disorder of kidney and ureter, unspecified: Secondary | ICD-10-CM | POA: Insufficient documentation

## 2020-05-13 DIAGNOSIS — F329 Major depressive disorder, single episode, unspecified: Secondary | ICD-10-CM | POA: Insufficient documentation

## 2020-05-13 DIAGNOSIS — M109 Gout, unspecified: Secondary | ICD-10-CM | POA: Insufficient documentation

## 2020-05-13 DIAGNOSIS — E118 Type 2 diabetes mellitus with unspecified complications: Secondary | ICD-10-CM | POA: Insufficient documentation

## 2020-05-13 DIAGNOSIS — I5030 Unspecified diastolic (congestive) heart failure: Secondary | ICD-10-CM | POA: Insufficient documentation

## 2020-05-13 DIAGNOSIS — R809 Proteinuria, unspecified: Secondary | ICD-10-CM | POA: Insufficient documentation

## 2020-05-13 DIAGNOSIS — N183 Chronic kidney disease, stage 3 unspecified: Secondary | ICD-10-CM | POA: Insufficient documentation

## 2020-05-13 DIAGNOSIS — F32A Depression, unspecified: Secondary | ICD-10-CM | POA: Insufficient documentation

## 2020-05-13 DIAGNOSIS — I10 Essential (primary) hypertension: Secondary | ICD-10-CM | POA: Insufficient documentation

## 2020-05-13 DIAGNOSIS — R252 Cramp and spasm: Secondary | ICD-10-CM | POA: Insufficient documentation

## 2020-05-13 DIAGNOSIS — E119 Type 2 diabetes mellitus without complications: Secondary | ICD-10-CM | POA: Insufficient documentation

## 2020-05-13 DIAGNOSIS — G629 Polyneuropathy, unspecified: Secondary | ICD-10-CM | POA: Insufficient documentation

## 2020-05-13 DIAGNOSIS — K219 Gastro-esophageal reflux disease without esophagitis: Secondary | ICD-10-CM | POA: Insufficient documentation

## 2020-05-13 DIAGNOSIS — M2550 Pain in unspecified joint: Secondary | ICD-10-CM | POA: Insufficient documentation

## 2020-05-13 DIAGNOSIS — D649 Anemia, unspecified: Secondary | ICD-10-CM | POA: Insufficient documentation

## 2020-05-13 DIAGNOSIS — R609 Edema, unspecified: Secondary | ICD-10-CM | POA: Insufficient documentation

## 2020-05-13 DIAGNOSIS — E291 Testicular hypofunction: Secondary | ICD-10-CM | POA: Insufficient documentation

## 2020-05-13 DIAGNOSIS — E785 Hyperlipidemia, unspecified: Secondary | ICD-10-CM | POA: Insufficient documentation

## 2020-05-13 DIAGNOSIS — N400 Enlarged prostate without lower urinary tract symptoms: Secondary | ICD-10-CM | POA: Insufficient documentation

## 2020-05-13 DIAGNOSIS — E1129 Type 2 diabetes mellitus with other diabetic kidney complication: Secondary | ICD-10-CM | POA: Insufficient documentation

## 2020-05-13 DIAGNOSIS — E1140 Type 2 diabetes mellitus with diabetic neuropathy, unspecified: Secondary | ICD-10-CM | POA: Insufficient documentation

## 2020-05-17 DIAGNOSIS — E1129 Type 2 diabetes mellitus with other diabetic kidney complication: Secondary | ICD-10-CM | POA: Diagnosis not present

## 2020-05-17 DIAGNOSIS — F3341 Major depressive disorder, recurrent, in partial remission: Secondary | ICD-10-CM | POA: Diagnosis not present

## 2020-05-17 DIAGNOSIS — R600 Localized edema: Secondary | ICD-10-CM | POA: Diagnosis not present

## 2020-05-17 DIAGNOSIS — R69 Illness, unspecified: Secondary | ICD-10-CM | POA: Diagnosis not present

## 2020-05-17 DIAGNOSIS — N1832 Chronic kidney disease, stage 3b: Secondary | ICD-10-CM | POA: Diagnosis not present

## 2020-05-17 DIAGNOSIS — E1140 Type 2 diabetes mellitus with diabetic neuropathy, unspecified: Secondary | ICD-10-CM | POA: Diagnosis not present

## 2020-05-17 DIAGNOSIS — I5030 Unspecified diastolic (congestive) heart failure: Secondary | ICD-10-CM | POA: Diagnosis not present

## 2020-05-17 DIAGNOSIS — R252 Cramp and spasm: Secondary | ICD-10-CM | POA: Diagnosis not present

## 2020-05-17 DIAGNOSIS — N400 Enlarged prostate without lower urinary tract symptoms: Secondary | ICD-10-CM | POA: Diagnosis not present

## 2020-05-17 DIAGNOSIS — R809 Proteinuria, unspecified: Secondary | ICD-10-CM | POA: Diagnosis not present

## 2020-05-17 DIAGNOSIS — I10 Essential (primary) hypertension: Secondary | ICD-10-CM | POA: Diagnosis not present

## 2020-05-19 ENCOUNTER — Encounter: Payer: Self-pay | Admitting: *Deleted

## 2020-05-19 ENCOUNTER — Other Ambulatory Visit: Payer: Self-pay

## 2020-05-19 ENCOUNTER — Encounter: Payer: Self-pay | Admitting: Cardiology

## 2020-05-19 ENCOUNTER — Ambulatory Visit: Payer: Medicare HMO | Admitting: Cardiology

## 2020-05-19 VITALS — BP 114/50 | HR 98 | Ht 69.5 in | Wt 267.0 lb

## 2020-05-19 DIAGNOSIS — I5032 Chronic diastolic (congestive) heart failure: Secondary | ICD-10-CM

## 2020-05-19 DIAGNOSIS — I10 Essential (primary) hypertension: Secondary | ICD-10-CM | POA: Diagnosis not present

## 2020-05-19 DIAGNOSIS — E782 Mixed hyperlipidemia: Secondary | ICD-10-CM | POA: Diagnosis not present

## 2020-05-19 DIAGNOSIS — N1832 Chronic kidney disease, stage 3b: Secondary | ICD-10-CM

## 2020-05-19 DIAGNOSIS — I2510 Atherosclerotic heart disease of native coronary artery without angina pectoris: Secondary | ICD-10-CM | POA: Diagnosis not present

## 2020-05-19 NOTE — Progress Notes (Signed)
Cardiology Consultation:    Date:  05/19/2020   ID:  Nyra Jabs, DOB 10/09/1955, MRN [id number]  PCP:  Cher Nakai, MD  Cardiologist:  Jenne Campus, MD   Referring MD: Cher Nakai, MD   Chief Complaint  Patient presents with  . Edema    History of Present Illness:    Nicholas Rice is a 64 y.o. male who is being seen today for the evaluation of edema and shortness of breath at the request of Cher Nakai, MD.  His past medical history significant for longstanding diabetes which is poorly controlled, his last hemoglobin A1c is more than 10, essential hypertension, dyslipidemia, exsmoking, obesity.  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  He was referred to [place] because of progressive swelling of lower extremities that he noticed over the last few months is getting much worse with in spite of quite high dose of diuretics.  He takes 40 mg of Lasix 3 times a day.  He also described to have profound fatigue and tiredness as well as shortness of breath.  It is very easy for him to get short of breath.  For example he have to put some supporting legs on his track and is very difficult for him to do.  He have to stop multiple times when he does not.  He told me last time he was able to do it without interruption was about a year ago.  Denies having any chest pain tightness squeezing pressure burning chest.  Does not exercise on the regular basis. His risk factors for coronary artery disease include dyslipidemia: Family history of premature coronary artery disease, his father had myocardial infarction at the age of 49, history of smoking, diabetes which is poorly controlled.  Essential hypertension.  Past Medical History:  Diagnosis Date  . Anemia   . Benign prostatic hyperplasia   . CAD in native artery   . Depression   . Diabetic neuropathy ([place])   . Diastolic heart failure ([place])   . Edema   . Enlarged prostate   . GERD (gastroesophageal reflux disease)   . Gout   .  Hyperlipidemia   . Hypertension   . Hypogonadism in male   . Muscle cramp   . Osteoarthritis   . Pancytopenia ([place]) 01/02/2015  . Peripheral neuropathy   . Proteinuria due to type 2 diabetes mellitus ([place])   . Renal insufficiency   . Stage 3 chronic kidney disease   . Type 2 diabetes with complication [facility])     Past Surgical History:  Procedure Laterality Date  . CATARACT EXTRACTION Bilateral   . CHOLECYSTECTOMY  1981  . [place]  . TONSILLECTOMY  1963    Current Medications: Current Meds  Medication Sig  . ACCU-CHEK GUIDE test strip   . Accu-Chek Softclix Lancets lancets   . amLODipine (NORVASC) 5 MG tablet   . APPLE CIDER VINEGAR PO Take by mouth.  Marland Kitchen aspirin EC 81 MG tablet Take 81 mg by mouth daily. Swallow whole.  Marland Kitchen atorvastatin (LIPITOR) 40 MG tablet Take 40 mg by mouth daily.  Marland Kitchen b complex vitamins capsule Take 1 capsule by mouth daily.  . Blood Glucose Monitoring Suppl (ACCU-CHEK GUIDE) w/Device KIT   . ferrous sulfate 325 (65 FE) MG EC tablet Take 325 mg by mouth. One a day.  . furosemide (LASIX) 40 MG tablet Take 40 mg by mouth 2 (two) times daily.  Marland Kitchen gabapentin (NEURONTIN) 600 MG tablet Take 600  mg by mouth at bedtime.   Marland Kitchen glipiZIDE (GLUCOTROL XL) 10 MG 24 hr tablet Take 10 mg by mouth 2 (two) times daily.  . insulin NPH Human (NOVOLIN N) 100 UNIT/ML injection Inject 140 Units into the skin in the morning, at noon, and at bedtime.  Marland Kitchen lisinopril-hydrochlorothiazide (PRINZIDE,ZESTORETIC) 20-12.5 MG tablet Take 1 tablet by mouth in the morning and at bedtime.   . magnesium oxide (MAG-OX) 400 MG tablet Take 400 mg by mouth daily. 4 a day.  . meloxicam (MOBIC) 15 MG tablet TAKE 1 TABLET BY MOUTH ONCE DAILY WITH MEALS AS NEEDED  . metFORMIN (GLUCOPHAGE) 850 MG tablet Take 850 mg by mouth 3 (three) times daily.  . Misc Natural Products (JOINT HEALTH PO) Take by mouth daily.  . pantoprazole (PROTONIX) 40 MG tablet Take 40 mg by mouth daily.  . Potassium 99  MG TABS Take by mouth.  . sertraline (ZOLOFT) 100 MG tablet Take 200 mg by mouth daily.   . tamsulosin (FLOMAX) 0.4 MG CAPS capsule Take 0.4 mg by mouth daily.  Nelva Nay MAX SOLOSTAR 300 UNIT/ML Solostar Pen 50 Units at bedtime.  . traMADol (ULTRAM) 50 MG tablet Take 50 mg by mouth 4 (four) times daily as needed.   . Vitamin D, Ergocalciferol, (DRISDOL) 1.25 MG (50000 UNIT) CAPS capsule Take 50,000 Units by mouth every 7 (seven) days.     Allergies:   Loratadine   Social History   Socioeconomic History  . Marital status: Single    Spouse name: Not on file  . Number of children: Not on file  . Years of education: Not on file  . Highest education level: Not on file  Occupational History  . Not on file  Tobacco Use  . Smoking status: Former Research scientist (life sciences)  . Smokeless tobacco: Never Used  Substance and Sexual Activity  . Alcohol use: Never  . Drug use: Never  . Sexual activity: Not on file  Other Topics Concern  . Not on file  Social History Narrative  . Not on file   Social Determinants of Health   Financial Resource Strain:   . Difficulty of Paying Living Expenses: Not on file  Food Insecurity:   . Worried About Charity fundraiser in the Last Year: Not on file  . Ran Out of Food in the Last Year: Not on file  Transportation Needs:   . Lack of Transportation (Medical): Not on file  . Lack of Transportation (Non-Medical): Not on file  Physical Activity:   . Days of Exercise per Week: Not on file  . Minutes of Exercise per Session: Not on file  Stress:   . Feeling of Stress : Not on file  Social Connections:   . Frequency of Communication with Friends and Family: Not on file  . Frequency of Social Gatherings with Friends and Family: Not on file  . Attends Religious Services: Not on file  . Active Member of Clubs or Organizations: Not on file  . Attends Archivist Meetings: Not on file  . Marital Status: Not on file     Family History: The patient's family  history includes Congestive Heart Failure in his mother; Diabetes in his father and mother; Emphysema in his mother; Heart attack in his father; Heart disease in his father. ROS:   Please see the history of present illness.    All 14 point review of systems negative except as described per history of present illness.  EKGs/Labs/Other Studies Reviewed:  The following studies were reviewed today:   EKG:  EKG is  ordered today.  The ekg ordered today demonstrates normal sinus rhythm, criteria for LVH, inferior infarct, nonspecific ST segment changes  Recent Labs: No results found for requested labs within last 8760 hours.  Recent Lipid Panel    Component Value Date/Time   CHOL 111 04/26/2020 0000   TRIG 345 (A) 04/26/2020 0000   VLDL 50 04/26/2020 0000    Physical Exam:    VS:  BP (!) 114/50 (BP Location: Right Arm, Patient Position: Sitting, Cuff Size: Large)   Pulse 98   Ht 5' 9.5" (1.765 m)   Wt 267 lb (121.1 kg)   SpO2 94%   BMI 38.86 kg/m     Wt Readings from Last 3 Encounters:  05/19/20 267 lb (121.1 kg)  04/26/20 268 lb (121.6 kg)  10/16/18 240 lb (108.9 kg)     GEN:  Well nourished, well developed in no acute distress HEENT: Normal NECK: No JVD; No carotid bruits LYMPHATICS: No lymphadenopathy CARDIAC: RRR, systolic ejection murmur grade 1/6 to 2/6 best heard right upper portion of the sternum, no rubs, no gallops RESPIRATORY:  Clear to auscultation without rales, wheezing or rhonchi  ABDOMEN: Soft, non-tender, non-distended MUSCULOSKELETAL: Lower extremity got 1-2+ pitting edema; No deformity, dorsalis pedis pedis is palpable on both legs SKIN: Warm and dry NEUROLOGIC:  Alert and oriented x 3 PSYCHIATRIC:  Normal affect   ASSESSMENT:    1. Essential hypertension   2. Chronic diastolic heart failure ([place])   3. CAD in native artery   4. Stage 3b chronic kidney disease   5. Mixed hyperlipidemia    PLAN:    In order of problems listed  above:  1. Dyspnea on exertion: Multifactorial.  I will ask him to have echocardiogram done to assess left ventricle ejection fraction.  Since he does have longstanding diabetes and already neuropathy I would not do stress test to rule out significant obstructive coronary artery disease.  I will not alter any of his medications, I will also ask him to have proBNP today. 2. Chronic diastolic congestive heart failure, [place] Heart Association class III, plan as outlined above. 3. Questionable coronary artery disease.  He does have dyspnea on exertion which could be coronary angina equivalent.  Stress test will be done to clarify that. 4. Mixed dyslipidemia: He is already on statin which is high intensity Lipitor 40 which I will continue.  I did review his K PN sent to me by primary care physician which showed LDL 27 HDL 34 this is from July 2021.  But a good number. 5. Diabetes mellitus which is still poorly controlled I do have his hemoglobin A1c from July 31 which showed hemoglobin A1c of 10.3. 6. Excessive tiredness fatigue and easy falling asleep during the day.  I suspect obstructive sleep apnea play significant role here.  I did do something also need to be investigated in the future. 7. Systolic heart murmur sounds like aortic stenosis however very early peaking I do not think we dealing with critical lesion however echocardiogram will be done to assess that   Medication Adjustments/Labs and Tests Ordered: Current medicines are reviewed at length with the patient today.  Concerns regarding medicines are outlined above.  Orders Placed This Encounter  Procedures  . MYOCARDIAL PERFUSION IMAGING  . EKG 12-Lead  . ECHOCARDIOGRAM COMPLETE   No orders of the defined types were placed in this encounter.   Signed, Park Liter,  MD, [facility]. 05/19/2020 2:21 PM    [facility]

## 2020-05-19 NOTE — Patient Instructions (Addendum)
Medication Instructions:  Your physician recommends that you continue on your current medications as directed. Please refer to the Current Medication list given to you today.   *If you need a refill on your cardiac medications before your next appointment, please call your pharmacy*   Lab Work: TODAY:  PRO BNP  If you have labs (blood work) drawn today and your tests are completely normal, you will receive your results only by: Marland Kitchen MyChart Message (if you have MyChart) OR . A paper copy in the mail If you have any lab test that is abnormal or we need to change your treatment, we will call you to review the results.   Testing/Procedures: Your physician has requested that you have an echocardiogram. Echocardiography is a painless test that uses sound waves to create images of your heart. It provides your doctor with information about the size and shape of your heart and how well your heart's chambers and valves are working. This procedure takes approximately one hour. There are no restrictions for this procedure.   Your physician has requested that you have a lexiscan myoview. For further information please visit [email or web address]. Please follow instruction sheet, BELOW:   The test will take approximately 3 to 4 hours to complete; you may bring reading material.  If someone comes with you to your appointment, they will need to remain in the main lobby due to limited space in the testing area. **If you are pregnant or breastfeeding, please notify the nuclear lab prior to your appointment**  How to prepare for your Myocardial Perfusion Test:  IF YOU AR NOT ABLE TO EAT A FULL BREAKFAST, HOLD YOUR DIABETIC MEDICATIONS UNTIL AFTER YOUR TEST  DO NOT TAKE THE LISINOPRIL-HCTZ UNTIL AFTER YOUR TEST  . Do not eat or drink 3 hours prior to your test, except you may have water. . Do not consume products containing caffeine (regular or decaffeinated) 12 hours prior to your test. (ex: coffee,  chocolate, sodas, tea). . Do bring a list of your current medications with you.  If not listed below, you may take your medications as normal. . Do wear comfortable clothes (no dresses or overalls) and walking shoes, tennis shoes preferred (No heels or open toe shoes are allowed). . Do NOT wear cologne, perfume, aftershave, or lotions (deodorant is allowed). . If these instructions are not followed, your test will have to be rescheduled.     Follow-Up: At [facility], you and your health needs are our priority.  As part of our continuing mission to provide you with exceptional heart care, we have created designated Provider Care Teams.  These Care Teams include your primary Cardiologist (physician) and Advanced Practice Providers (APPs -  Physician Assistants and Nurse Practitioners) who all work together to provide you with the care you need, when you need it.  We recommend signing up for the patient portal called "MyChart".  Sign up information is provided on this After Visit Summary.  MyChart is used to connect with patients for Virtual Visits (Telemedicine).  Patients are able to view lab/test results, encounter notes, upcoming appointments, etc.  Non-urgent messages can be sent to your provider as well.   To learn more about what you can do with MyChart, go to [email or web address].    Your next appointment:   1 month(s)  The format for your next appointment:   In Person  Provider:   Jenne Campus, MD   Other Instructions  Echocardiogram An echocardiogram is a procedure that uses painless sound  waves (ultrasound) to produce an image of the heart. Images from an echocardiogram can provide important information about:  Signs of coronary artery disease (CAD).  Aneurysm detection. An aneurysm is a weak or damaged part of an artery wall that bulges out from the normal force of blood pumping through the body.  Heart size and shape. Changes in the size or shape of the heart can  be associated with certain conditions, including heart failure, aneurysm, and CAD.  Heart muscle function.  Heart valve function.  Signs of a past heart attack.  Fluid buildup around the heart.  Thickening of the heart muscle.  A tumor or infectious growth around the heart valves. Tell a health care provider about:  Any allergies you have.  All medicines you are taking, including vitamins, herbs, eye drops, creams, and over-the-counter medicines.  Any blood disorders you have.  Any surgeries you have had.  Any medical conditions you have.  Whether you are pregnant or may be pregnant. What are the risks? Generally, this is a safe procedure. However, problems may occur, including:  Allergic reaction to dye (contrast) that may be used during the procedure. What happens before the procedure? No specific preparation is needed. You may eat and drink normally. What happens during the procedure?   An IV tube may be inserted into one of your veins.  You may receive contrast through this tube. A contrast is an injection that improves the quality of the pictures from your heart.  A gel will be applied to your chest.  A wand-like tool (transducer) will be moved over your chest. The gel will help to transmit the sound waves from the transducer.  The sound waves will harmlessly bounce off of your heart to allow the heart images to be captured in real-time motion. The images will be recorded on a computer. The procedure may vary among health care providers and hospitals. What happens after the procedure?  You may return to your normal, everyday life, including diet, activities, and medicines, unless your health care provider tells you not to do that. Summary  An echocardiogram is a procedure that uses painless sound waves (ultrasound) to produce an image of the heart.  Images from an echocardiogram can provide important information about the size and shape of your heart, heart  muscle function, heart valve function, and fluid buildup around your heart.  You do not need to do anything to prepare before this procedure. You may eat and drink normally.  After the echocardiogram is completed, you may return to your normal, everyday life, unless your health care provider tells you not to do that. This information is not intended to replace advice given to you by your health care provider. Make sure you discuss any questions you have with your health care provider. Document Revised: 01/04/2019 Document Reviewed: 10/16/2016 Elsevier Patient Education  2020 [place].   Cardiac Nuclear Scan A cardiac nuclear scan is a test that measures blood flow to the heart when a person is resting and when he or she is exercising. The test looks for problems such as:  Not enough blood reaching a portion of the heart.  The heart muscle not working normally. You may need this test if:  You have heart disease.  You have had abnormal lab results.  You have had heart surgery or a balloon procedure to open up blocked arteries (angioplasty).  You have chest pain.  You have shortness of breath. In this test, a radioactive dye (tracer)  is injected into your bloodstream. After the tracer has traveled to your heart, an imaging device is used to measure how much of the tracer is absorbed by or distributed to various areas of your heart. This procedure is usually done at a hospital and takes 2-4 hours. Tell a health care provider about:  Any allergies you have.  All medicines you are taking, including vitamins, herbs, eye drops, creams, and over-the-counter medicines.  Any problems you or family members have had with anesthetic medicines.  Any blood disorders you have.  Any surgeries you have had.  Any medical conditions you have.  Whether you are pregnant or may be pregnant. What are the risks? Generally, this is a safe procedure. However, problems may occur,  including:  Serious chest pain and heart attack. This is only a risk if the stress portion of the test is done.  Rapid heartbeat.  Sensation of warmth in your chest. This usually passes quickly.  Allergic reaction to the tracer. What happens before the procedure?  Ask your health care provider about changing or stopping your regular medicines. This is especially important if you are taking diabetes medicines or blood thinners.  Follow instructions from your health care provider about eating or drinking restrictions.  Remove your jewelry on the day of the procedure. What happens during the procedure?  An IV will be inserted into one of your veins.  Your health care provider will inject a small amount of radioactive tracer through the IV.  You will wait for 20-40 minutes while the tracer travels through your bloodstream.  Your heart activity will be monitored with an electrocardiogram (ECG).  You will lie down on an exam table.  Images of your heart will be taken for about 15-20 minutes.  You may also have a stress test. For this test, one of the following may be done: ? You will exercise on a treadmill or stationary bike. While you exercise, your heart's activity will be monitored with an ECG, and your blood pressure will be checked. ? You will be given medicines that will increase blood flow to parts of your heart. This is done if you are unable to exercise.  When blood flow to your heart has peaked, a tracer will again be injected through the IV.  After 20-40 minutes, you will get back on the exam table and have more images taken of your heart.  Depending on the type of tracer used, scans may need to be repeated 3-4 hours later.  Your IV line will be removed when the procedure is over. The procedure may vary among health care providers and hospitals. What happens after the procedure?  Unless your health care provider tells you otherwise, you may return to your normal  schedule, including diet, activities, and medicines.  Unless your health care provider tells you otherwise, you may increase your fluid intake. This will help to flush the contrast dye from your body. Drink enough fluid to keep your urine pale yellow.  Ask your health care provider, or the department that is doing the test: ? When will my results be ready? ? How will I get my results? Summary  A cardiac nuclear scan measures the blood flow to the heart when a person is resting and when he or she is exercising.  Tell your health care provider if you are pregnant.  Before the procedure, ask your health care provider about changing or stopping your regular medicines. This is especially important if you are taking  diabetes medicines or blood thinners.  After the procedure, unless your health care provider tells you otherwise, increase your fluid intake. This will help flush the contrast dye from your body.  After the procedure, unless your health care provider tells you otherwise, you may return to your normal schedule, including diet, activities, and medicines. This information is not intended to replace advice given to you by your health care provider. Make sure you discuss any questions you have with your health care provider. Document Revised: 02/27/2018 Document Reviewed: 02/27/2018 Elsevier Patient Education  [place].

## 2020-05-20 ENCOUNTER — Telehealth (HOSPITAL_COMMUNITY): Payer: Self-pay

## 2020-05-20 LAB — PRO B NATRIURETIC PEPTIDE: NT-Pro BNP: 79 pg/mL (ref 0–376)

## 2020-05-20 NOTE — Telephone Encounter (Signed)
Attempted to contact the patient. Someone answered the phone yelled and then said nothing. Nicholas Rice EMTP

## 2020-05-22 ENCOUNTER — Other Ambulatory Visit: Payer: Self-pay

## 2020-05-22 ENCOUNTER — Encounter (HOSPITAL_COMMUNITY): Payer: Self-pay | Admitting: *Deleted

## 2020-05-22 ENCOUNTER — Ambulatory Visit (HOSPITAL_COMMUNITY): Payer: Medicare HMO | Attending: Cardiovascular Disease

## 2020-05-22 DIAGNOSIS — I10 Essential (primary) hypertension: Secondary | ICD-10-CM

## 2020-05-22 DIAGNOSIS — I5032 Chronic diastolic (congestive) heart failure: Secondary | ICD-10-CM | POA: Insufficient documentation

## 2020-05-22 DIAGNOSIS — I2510 Atherosclerotic heart disease of native coronary artery without angina pectoris: Secondary | ICD-10-CM | POA: Diagnosis not present

## 2020-05-22 MED ORDER — TECHNETIUM TC 99M TETROFOSMIN IV KIT
31.7000 | PACK | Freq: Once | INTRAVENOUS | Status: AC | PRN
  Administered 2020-05-22: 31.7 via INTRAVENOUS
  Filled 2020-05-22: qty 32

## 2020-05-23 ENCOUNTER — Ambulatory Visit (INDEPENDENT_AMBULATORY_CARE_PROVIDER_SITE_OTHER): Payer: Medicare HMO

## 2020-05-23 ENCOUNTER — Ambulatory Visit (HOSPITAL_COMMUNITY): Payer: Medicare HMO | Attending: Cardiovascular Disease

## 2020-05-23 DIAGNOSIS — I2510 Atherosclerotic heart disease of native coronary artery without angina pectoris: Secondary | ICD-10-CM

## 2020-05-23 DIAGNOSIS — I5032 Chronic diastolic (congestive) heart failure: Secondary | ICD-10-CM

## 2020-05-23 DIAGNOSIS — I10 Essential (primary) hypertension: Secondary | ICD-10-CM | POA: Diagnosis not present

## 2020-05-23 LAB — ECHOCARDIOGRAM COMPLETE
AR max vel: 1.42 cm2
AV Area VTI: 1.65 cm2
AV Area mean vel: 1.48 cm2
AV Mean grad: 13 mmHg
AV Peak grad: 23.4 mmHg
Ao pk vel: 2.42 m/s
Area-P 1/2: 2.93 cm2
S' Lateral: 3.8 cm

## 2020-05-23 LAB — MYOCARDIAL PERFUSION IMAGING
LV dias vol: 117 mL (ref 62–150)
LV sys vol: 35 mL
Peak HR: 92 {beats}/min
Rest HR: 85 {beats}/min
SDS: 1
SRS: 0
SSS: 1
TID: 0.97

## 2020-05-23 MED ORDER — REGADENOSON 0.4 MG/5ML IV SOLN
0.4000 mg | Freq: Once | INTRAVENOUS | Status: AC
  Administered 2020-05-23: 0.4 mg via INTRAVENOUS

## 2020-05-23 MED ORDER — TECHNETIUM TC 99M TETROFOSMIN IV KIT
31.4000 | PACK | Freq: Once | INTRAVENOUS | Status: AC | PRN
  Administered 2020-05-23: 31.4 via INTRAVENOUS
  Filled 2020-05-23: qty 32

## 2020-05-23 NOTE — Progress Notes (Signed)
Complete echocardiogram performed.  Jimmy Jakeia Carreras RDCS, RVT  

## 2020-05-26 DIAGNOSIS — R69 Illness, unspecified: Secondary | ICD-10-CM | POA: Diagnosis not present

## 2020-05-26 DIAGNOSIS — N183 Chronic kidney disease, stage 3 unspecified: Secondary | ICD-10-CM | POA: Diagnosis not present

## 2020-05-26 DIAGNOSIS — Z6838 Body mass index (BMI) 38.0-38.9, adult: Secondary | ICD-10-CM | POA: Diagnosis not present

## 2020-05-26 DIAGNOSIS — Z713 Dietary counseling and surveillance: Secondary | ICD-10-CM | POA: Diagnosis not present

## 2020-05-26 DIAGNOSIS — E1122 Type 2 diabetes mellitus with diabetic chronic kidney disease: Secondary | ICD-10-CM | POA: Diagnosis not present

## 2020-05-26 DIAGNOSIS — E1140 Type 2 diabetes mellitus with diabetic neuropathy, unspecified: Secondary | ICD-10-CM | POA: Diagnosis not present

## 2020-05-26 DIAGNOSIS — E1129 Type 2 diabetes mellitus with other diabetic kidney complication: Secondary | ICD-10-CM | POA: Diagnosis not present

## 2020-05-27 ENCOUNTER — Telehealth: Payer: Self-pay | Admitting: Cardiology

## 2020-05-27 NOTE — Telephone Encounter (Signed)
° ° °  Pt would like to know the results of his tests and also wanted to ask Dr. Raliegh Ip when he can go back to work.

## 2020-05-27 NOTE — Telephone Encounter (Signed)
Attempted to call patient back. No answer and voicemail not set up. Dr. Agustin Cree hasn't reviewed results yet I will let him know patient is asking for them.

## 2020-05-27 NOTE — Telephone Encounter (Signed)
Patient returning call.

## 2020-05-27 NOTE — Telephone Encounter (Signed)
I did review stress test, did not show any ischemia, echocardiogram also looks good.  So from my point of view should be fine to go back to work

## 2020-05-27 NOTE — Telephone Encounter (Signed)
Attempted to call patient. No answer. Voicemail box not set up.

## 2020-05-27 NOTE — Telephone Encounter (Signed)
Follow Up:    Pt is calling again, he says he needs to know about his test results and if he will be able to return to work.He is very anxious to find out his test results.

## 2020-05-27 NOTE — Telephone Encounter (Signed)
Called patient back and made him aware of Dr. Wendy Poet recommendations. Patient verbalized understanding.

## 2020-05-28 DIAGNOSIS — R809 Proteinuria, unspecified: Secondary | ICD-10-CM | POA: Diagnosis not present

## 2020-05-28 DIAGNOSIS — E1129 Type 2 diabetes mellitus with other diabetic kidney complication: Secondary | ICD-10-CM | POA: Diagnosis not present

## 2020-05-28 DIAGNOSIS — I10 Essential (primary) hypertension: Secondary | ICD-10-CM | POA: Diagnosis not present

## 2020-05-28 DIAGNOSIS — R635 Abnormal weight gain: Secondary | ICD-10-CM | POA: Diagnosis not present

## 2020-05-28 DIAGNOSIS — E1140 Type 2 diabetes mellitus with diabetic neuropathy, unspecified: Secondary | ICD-10-CM | POA: Diagnosis not present

## 2020-05-28 DIAGNOSIS — N1832 Chronic kidney disease, stage 3b: Secondary | ICD-10-CM | POA: Diagnosis not present

## 2020-05-28 DIAGNOSIS — I5030 Unspecified diastolic (congestive) heart failure: Secondary | ICD-10-CM | POA: Diagnosis not present

## 2020-05-28 DIAGNOSIS — R600 Localized edema: Secondary | ICD-10-CM | POA: Diagnosis not present

## 2020-05-28 DIAGNOSIS — N400 Enlarged prostate without lower urinary tract symptoms: Secondary | ICD-10-CM | POA: Diagnosis not present

## 2020-05-28 DIAGNOSIS — R69 Illness, unspecified: Secondary | ICD-10-CM | POA: Diagnosis not present

## 2020-06-01 ENCOUNTER — Encounter (HOSPITAL_COMMUNITY): Payer: Self-pay

## 2020-06-01 ENCOUNTER — Other Ambulatory Visit: Payer: Self-pay

## 2020-06-01 ENCOUNTER — Emergency Department (HOSPITAL_COMMUNITY): Payer: Medicare HMO

## 2020-06-01 ENCOUNTER — Inpatient Hospital Stay (HOSPITAL_COMMUNITY)
Admission: EM | Admit: 2020-06-01 | Discharge: 2020-06-10 | DRG: 194 | Disposition: A | Discharge status: Home Health | Payer: Medicare HMO | Attending: Internal Medicine | Admitting: Internal Medicine

## 2020-06-01 DIAGNOSIS — F17210 Nicotine dependence, cigarettes, uncomplicated: Secondary | ICD-10-CM | POA: Diagnosis present

## 2020-06-01 DIAGNOSIS — N2889 Other specified disorders of kidney and ureter: Secondary | ICD-10-CM | POA: Diagnosis present

## 2020-06-01 DIAGNOSIS — M109 Gout, unspecified: Secondary | ICD-10-CM | POA: Diagnosis not present

## 2020-06-01 DIAGNOSIS — E119 Type 2 diabetes mellitus without complications: Secondary | ICD-10-CM

## 2020-06-01 DIAGNOSIS — M79609 Pain in unspecified limb: Secondary | ICD-10-CM | POA: Diagnosis not present

## 2020-06-01 DIAGNOSIS — I10 Essential (primary) hypertension: Secondary | ICD-10-CM | POA: Diagnosis not present

## 2020-06-01 DIAGNOSIS — I2510 Atherosclerotic heart disease of native coronary artery without angina pectoris: Secondary | ICD-10-CM | POA: Diagnosis not present

## 2020-06-01 DIAGNOSIS — K760 Fatty (change of) liver, not elsewhere classified: Secondary | ICD-10-CM | POA: Diagnosis present

## 2020-06-01 DIAGNOSIS — Z6837 Body mass index (BMI) 37.0-37.9, adult: Secondary | ICD-10-CM

## 2020-06-01 DIAGNOSIS — L570 Actinic keratosis: Secondary | ICD-10-CM | POA: Diagnosis present

## 2020-06-01 DIAGNOSIS — D509 Iron deficiency anemia, unspecified: Secondary | ICD-10-CM | POA: Diagnosis present

## 2020-06-01 DIAGNOSIS — N189 Chronic kidney disease, unspecified: Secondary | ICD-10-CM | POA: Diagnosis not present

## 2020-06-01 DIAGNOSIS — K7689 Other specified diseases of liver: Secondary | ICD-10-CM | POA: Diagnosis not present

## 2020-06-01 DIAGNOSIS — M1310 Monoarthritis, not elsewhere classified, unspecified site: Secondary | ICD-10-CM | POA: Diagnosis present

## 2020-06-01 DIAGNOSIS — Z888 Allergy status to other drugs, medicaments and biological substances status: Secondary | ICD-10-CM

## 2020-06-01 DIAGNOSIS — I872 Venous insufficiency (chronic) (peripheral): Secondary | ICD-10-CM | POA: Diagnosis present

## 2020-06-01 DIAGNOSIS — E1140 Type 2 diabetes mellitus with diabetic neuropathy, unspecified: Secondary | ICD-10-CM | POA: Diagnosis present

## 2020-06-01 DIAGNOSIS — I130 Hypertensive heart and chronic kidney disease with heart failure and stage 1 through stage 4 chronic kidney disease, or unspecified chronic kidney disease: Secondary | ICD-10-CM | POA: Diagnosis not present

## 2020-06-01 DIAGNOSIS — I25119 Atherosclerotic heart disease of native coronary artery with unspecified angina pectoris: Secondary | ICD-10-CM | POA: Diagnosis present

## 2020-06-01 DIAGNOSIS — R531 Weakness: Secondary | ICD-10-CM | POA: Diagnosis not present

## 2020-06-01 DIAGNOSIS — N400 Enlarged prostate without lower urinary tract symptoms: Secondary | ICD-10-CM | POA: Diagnosis present

## 2020-06-01 DIAGNOSIS — M25469 Effusion, unspecified knee: Secondary | ICD-10-CM

## 2020-06-01 DIAGNOSIS — M25562 Pain in left knee: Secondary | ICD-10-CM | POA: Diagnosis not present

## 2020-06-01 DIAGNOSIS — Z8701 Personal history of pneumonia (recurrent): Secondary | ICD-10-CM

## 2020-06-01 DIAGNOSIS — K7460 Unspecified cirrhosis of liver: Secondary | ICD-10-CM | POA: Diagnosis not present

## 2020-06-01 DIAGNOSIS — K219 Gastro-esophageal reflux disease without esophagitis: Secondary | ICD-10-CM | POA: Diagnosis present

## 2020-06-01 DIAGNOSIS — R809 Proteinuria, unspecified: Secondary | ICD-10-CM | POA: Diagnosis present

## 2020-06-01 DIAGNOSIS — I5030 Unspecified diastolic (congestive) heart failure: Secondary | ICD-10-CM | POA: Diagnosis not present

## 2020-06-01 DIAGNOSIS — Z20822 Contact with and (suspected) exposure to covid-19: Secondary | ICD-10-CM | POA: Diagnosis present

## 2020-06-01 DIAGNOSIS — E785 Hyperlipidemia, unspecified: Secondary | ICD-10-CM | POA: Diagnosis present

## 2020-06-01 DIAGNOSIS — D696 Thrombocytopenia, unspecified: Secondary | ICD-10-CM | POA: Diagnosis not present

## 2020-06-01 DIAGNOSIS — Z825 Family history of asthma and other chronic lower respiratory diseases: Secondary | ICD-10-CM | POA: Diagnosis not present

## 2020-06-01 DIAGNOSIS — I5032 Chronic diastolic (congestive) heart failure: Secondary | ICD-10-CM | POA: Diagnosis present

## 2020-06-01 DIAGNOSIS — R05 Cough: Secondary | ICD-10-CM | POA: Diagnosis not present

## 2020-06-01 DIAGNOSIS — E1122 Type 2 diabetes mellitus with diabetic chronic kidney disease: Secondary | ICD-10-CM | POA: Diagnosis present

## 2020-06-01 DIAGNOSIS — R0902 Hypoxemia: Secondary | ICD-10-CM

## 2020-06-01 DIAGNOSIS — R195 Other fecal abnormalities: Secondary | ICD-10-CM | POA: Diagnosis not present

## 2020-06-01 DIAGNOSIS — M7121 Synovial cyst of popliteal space [Baker], right knee: Secondary | ICD-10-CM | POA: Diagnosis present

## 2020-06-01 DIAGNOSIS — J129 Viral pneumonia, unspecified: Principal | ICD-10-CM | POA: Diagnosis present

## 2020-06-01 DIAGNOSIS — R609 Edema, unspecified: Secondary | ICD-10-CM | POA: Diagnosis present

## 2020-06-01 DIAGNOSIS — R0602 Shortness of breath: Secondary | ICD-10-CM | POA: Diagnosis not present

## 2020-06-01 DIAGNOSIS — R0989 Other specified symptoms and signs involving the circulatory and respiratory systems: Secondary | ICD-10-CM | POA: Diagnosis present

## 2020-06-01 DIAGNOSIS — N183 Chronic kidney disease, stage 3 unspecified: Secondary | ICD-10-CM | POA: Diagnosis not present

## 2020-06-01 DIAGNOSIS — R918 Other nonspecific abnormal finding of lung field: Secondary | ICD-10-CM | POA: Diagnosis not present

## 2020-06-01 DIAGNOSIS — E875 Hyperkalemia: Secondary | ICD-10-CM

## 2020-06-01 DIAGNOSIS — E669 Obesity, unspecified: Secondary | ICD-10-CM | POA: Diagnosis present

## 2020-06-01 DIAGNOSIS — R7989 Other specified abnormal findings of blood chemistry: Secondary | ICD-10-CM | POA: Diagnosis present

## 2020-06-01 DIAGNOSIS — J189 Pneumonia, unspecified organism: Secondary | ICD-10-CM

## 2020-06-01 DIAGNOSIS — K766 Portal hypertension: Secondary | ICD-10-CM | POA: Diagnosis not present

## 2020-06-01 DIAGNOSIS — E1165 Type 2 diabetes mellitus with hyperglycemia: Secondary | ICD-10-CM | POA: Diagnosis present

## 2020-06-01 DIAGNOSIS — D631 Anemia in chronic kidney disease: Secondary | ICD-10-CM | POA: Diagnosis not present

## 2020-06-01 DIAGNOSIS — R162 Hepatomegaly with splenomegaly, not elsewhere classified: Secondary | ICD-10-CM | POA: Diagnosis present

## 2020-06-01 DIAGNOSIS — M25662 Stiffness of left knee, not elsewhere classified: Secondary | ICD-10-CM | POA: Diagnosis present

## 2020-06-01 DIAGNOSIS — Z833 Family history of diabetes mellitus: Secondary | ICD-10-CM | POA: Diagnosis not present

## 2020-06-01 DIAGNOSIS — R161 Splenomegaly, not elsewhere classified: Secondary | ICD-10-CM | POA: Diagnosis not present

## 2020-06-01 DIAGNOSIS — D61818 Other pancytopenia: Secondary | ICD-10-CM | POA: Diagnosis not present

## 2020-06-01 DIAGNOSIS — Z8249 Family history of ischemic heart disease and other diseases of the circulatory system: Secondary | ICD-10-CM | POA: Diagnosis not present

## 2020-06-01 DIAGNOSIS — N179 Acute kidney failure, unspecified: Secondary | ICD-10-CM

## 2020-06-01 DIAGNOSIS — I700 Atherosclerosis of aorta: Secondary | ICD-10-CM | POA: Diagnosis not present

## 2020-06-01 DIAGNOSIS — D6959 Other secondary thrombocytopenia: Secondary | ICD-10-CM | POA: Diagnosis present

## 2020-06-01 DIAGNOSIS — M25462 Effusion, left knee: Secondary | ICD-10-CM | POA: Diagnosis not present

## 2020-06-01 DIAGNOSIS — K5900 Constipation, unspecified: Secondary | ICD-10-CM | POA: Diagnosis not present

## 2020-06-01 DIAGNOSIS — M7989 Other specified soft tissue disorders: Secondary | ICD-10-CM | POA: Diagnosis not present

## 2020-06-01 DIAGNOSIS — R160 Hepatomegaly, not elsewhere classified: Secondary | ICD-10-CM | POA: Diagnosis not present

## 2020-06-01 DIAGNOSIS — M79603 Pain in arm, unspecified: Secondary | ICD-10-CM

## 2020-06-01 DIAGNOSIS — Z794 Long term (current) use of insulin: Secondary | ICD-10-CM

## 2020-06-01 DIAGNOSIS — F329 Major depressive disorder, single episode, unspecified: Secondary | ICD-10-CM | POA: Diagnosis present

## 2020-06-01 DIAGNOSIS — R197 Diarrhea, unspecified: Secondary | ICD-10-CM | POA: Diagnosis present

## 2020-06-01 LAB — PROTIME-INR
INR: 1.1 (ref 0.8–1.2)
Prothrombin Time: 14.1 seconds (ref 11.4–15.2)

## 2020-06-01 LAB — RETICULOCYTES
Immature Retic Fract: 31.6 % — ABNORMAL HIGH (ref 2.3–15.9)
RBC.: 3.1 MIL/uL — ABNORMAL LOW (ref 4.22–5.81)
Retic Count, Absolute: 120.3 10*3/uL (ref 19.0–186.0)
Retic Ct Pct: 3.9 % — ABNORMAL HIGH (ref 0.4–3.1)

## 2020-06-01 LAB — IRON AND TIBC
Iron: 20 ug/dL — ABNORMAL LOW (ref 45–182)
Saturation Ratios: 6 % — ABNORMAL LOW (ref 17.9–39.5)
TIBC: 326 ug/dL (ref 250–450)
UIBC: 306 ug/dL

## 2020-06-01 LAB — URINALYSIS, ROUTINE W REFLEX MICROSCOPIC
Bacteria, UA: NONE SEEN
Bilirubin Urine: NEGATIVE
Glucose, UA: >=500 mg/dL — AB
Hgb urine dipstick: NEGATIVE
Ketones, ur: NEGATIVE mg/dL
Leukocytes,Ua: NEGATIVE
Nitrite: NEGATIVE
Protein, ur: NEGATIVE mg/dL
Specific Gravity, Urine: 1.013 (ref 1.005–1.030)
pH: 6 (ref 5.0–8.0)

## 2020-06-01 LAB — CBC
HCT: 30.7 % — ABNORMAL LOW (ref 39.0–52.0)
Hemoglobin: 9.8 g/dL — ABNORMAL LOW (ref 13.0–17.0)
MCH: 30.9 pg (ref 26.0–34.0)
MCHC: 31.9 g/dL (ref 30.0–36.0)
MCV: 96.8 fL (ref 80.0–100.0)
Platelets: 78 10*3/uL — ABNORMAL LOW (ref 150–400)
RBC: 3.17 MIL/uL — ABNORMAL LOW (ref 4.22–5.81)
RDW: 15.5 % (ref 11.5–15.5)
WBC: 4.7 10*3/uL (ref 4.0–10.5)
nRBC: 0 % (ref 0.0–0.2)

## 2020-06-01 LAB — FERRITIN: Ferritin: 72 ng/mL (ref 24–336)

## 2020-06-01 LAB — BRAIN NATRIURETIC PEPTIDE: B Natriuretic Peptide: 184.7 pg/mL — ABNORMAL HIGH (ref 0.0–100.0)

## 2020-06-01 LAB — BASIC METABOLIC PANEL
Anion gap: 9 (ref 5–15)
BUN: 56 mg/dL — ABNORMAL HIGH (ref 8–23)
CO2: 20 mmol/L — ABNORMAL LOW (ref 22–32)
Calcium: 8.9 mg/dL (ref 8.9–10.3)
Chloride: 107 mmol/L (ref 98–111)
Creatinine, Ser: 1.75 mg/dL — ABNORMAL HIGH (ref 0.61–1.24)
GFR calc Af Amer: 46 mL/min — ABNORMAL LOW (ref >60)
GFR calc non Af Amer: 40 mL/min — ABNORMAL LOW (ref >60)
Glucose, Bld: 415 mg/dL — ABNORMAL HIGH (ref 70–99)
Potassium: 5.4 mmol/L — ABNORMAL HIGH (ref 3.5–5.1)
Sodium: 136 mmol/L (ref 135–145)

## 2020-06-01 LAB — COMPREHENSIVE METABOLIC PANEL
ALT: 42 U/L (ref 0–44)
AST: 43 U/L — ABNORMAL HIGH (ref 15–41)
Albumin: 3.6 g/dL (ref 3.5–5.0)
Alkaline Phosphatase: 97 U/L (ref 38–126)
Anion gap: 9 (ref 5–15)
BUN: 45 mg/dL — ABNORMAL HIGH (ref 8–23)
CO2: 21 mmol/L — ABNORMAL LOW (ref 22–32)
Calcium: 9.1 mg/dL (ref 8.9–10.3)
Chloride: 107 mmol/L (ref 98–111)
Creatinine, Ser: 1.47 mg/dL — ABNORMAL HIGH (ref 0.61–1.24)
GFR calc Af Amer: 57 mL/min — ABNORMAL LOW (ref >60)
GFR calc non Af Amer: 49 mL/min — ABNORMAL LOW (ref >60)
Glucose, Bld: 355 mg/dL — ABNORMAL HIGH (ref 70–99)
Potassium: 5.4 mmol/L — ABNORMAL HIGH (ref 3.5–5.1)
Sodium: 137 mmol/L (ref 135–145)
Total Bilirubin: 1.1 mg/dL (ref 0.3–1.2)
Total Protein: 7.2 g/dL (ref 6.5–8.1)

## 2020-06-01 LAB — SARS CORONAVIRUS 2 BY RT PCR (HOSPITAL ORDER, PERFORMED IN ~~LOC~~ HOSPITAL LAB): SARS Coronavirus 2: NEGATIVE

## 2020-06-01 LAB — HEMOGLOBIN A1C
Hgb A1c MFr Bld: 9.1 % — ABNORMAL HIGH (ref 4.8–5.6)
Mean Plasma Glucose: 214.47 mg/dL

## 2020-06-01 LAB — LACTIC ACID, PLASMA
Lactic Acid, Venous: 1.8 mmol/L (ref 0.5–1.9)
Lactic Acid, Venous: 2.6 mmol/L (ref 0.5–1.9)

## 2020-06-01 LAB — HEMOGLOBIN AND HEMATOCRIT, BLOOD
HCT: 31.5 % — ABNORMAL LOW (ref 39.0–52.0)
Hemoglobin: 9.8 g/dL — ABNORMAL LOW (ref 13.0–17.0)

## 2020-06-01 LAB — POC OCCULT BLOOD, ED: Fecal Occult Bld: POSITIVE — AB

## 2020-06-01 LAB — VITAMIN B12: Vitamin B-12: 287 pg/mL (ref 180–914)

## 2020-06-01 LAB — PROCALCITONIN: Procalcitonin: <0.1 ng/mL

## 2020-06-01 LAB — FOLATE: Folate: 33.2 ng/mL (ref >5.9)

## 2020-06-01 LAB — TECHNOLOGIST SMEAR REVIEW: Plt Morphology: NORMAL

## 2020-06-01 LAB — MAGNESIUM: Magnesium: 1.7 mg/dL (ref 1.7–2.4)

## 2020-06-01 LAB — GLUCOSE, CAPILLARY: Glucose-Capillary: 360 mg/dL — ABNORMAL HIGH (ref 70–99)

## 2020-06-01 LAB — TROPONIN I (HIGH SENSITIVITY)
Troponin I (High Sensitivity): 7 ng/L (ref <18)
Troponin I (High Sensitivity): 8 ng/L (ref <18)

## 2020-06-01 LAB — LIPASE, BLOOD: Lipase: 37 U/L (ref 11–51)

## 2020-06-01 IMAGING — DX DG CHEST 2V
3 series · 3 of 3 positions shown · non-contrast
Comparison: Radiographs [DATE].  CT [DATE].

CLINICAL DATA: Cough and shortness of breath for 3 days. History of
pneumonia. Reportedly not vaccinated for COVID 19.

EXAM:
CHEST - 2 VIEW

[chest pa]
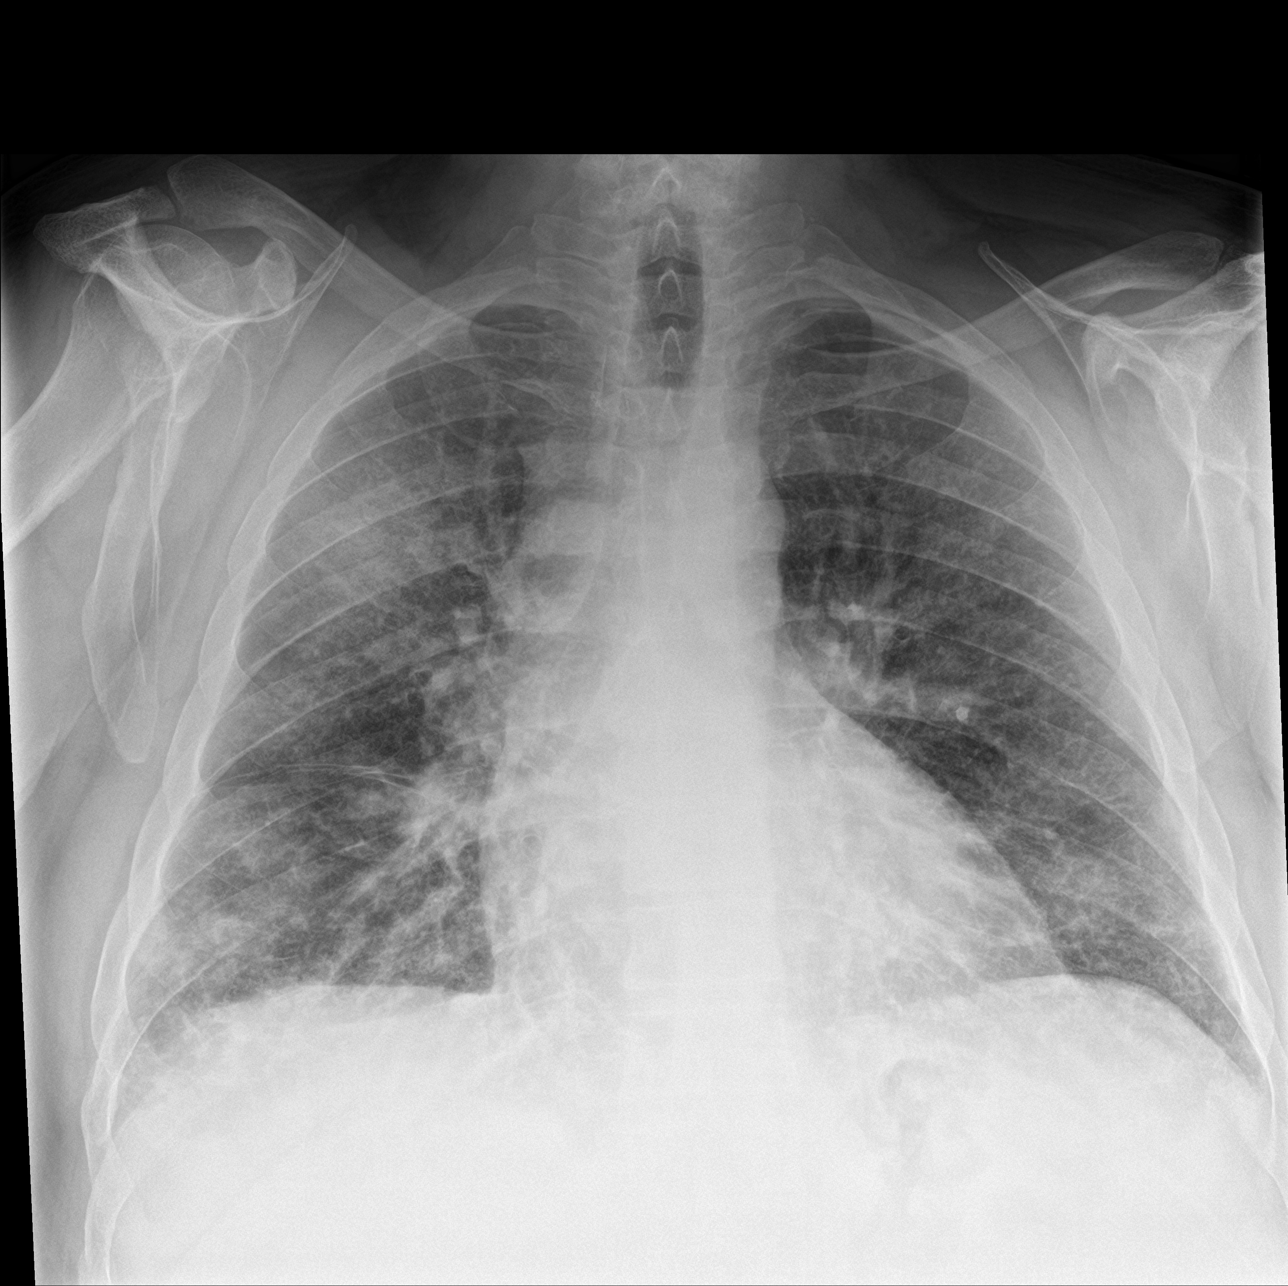

[chest lat (1 of 2)]
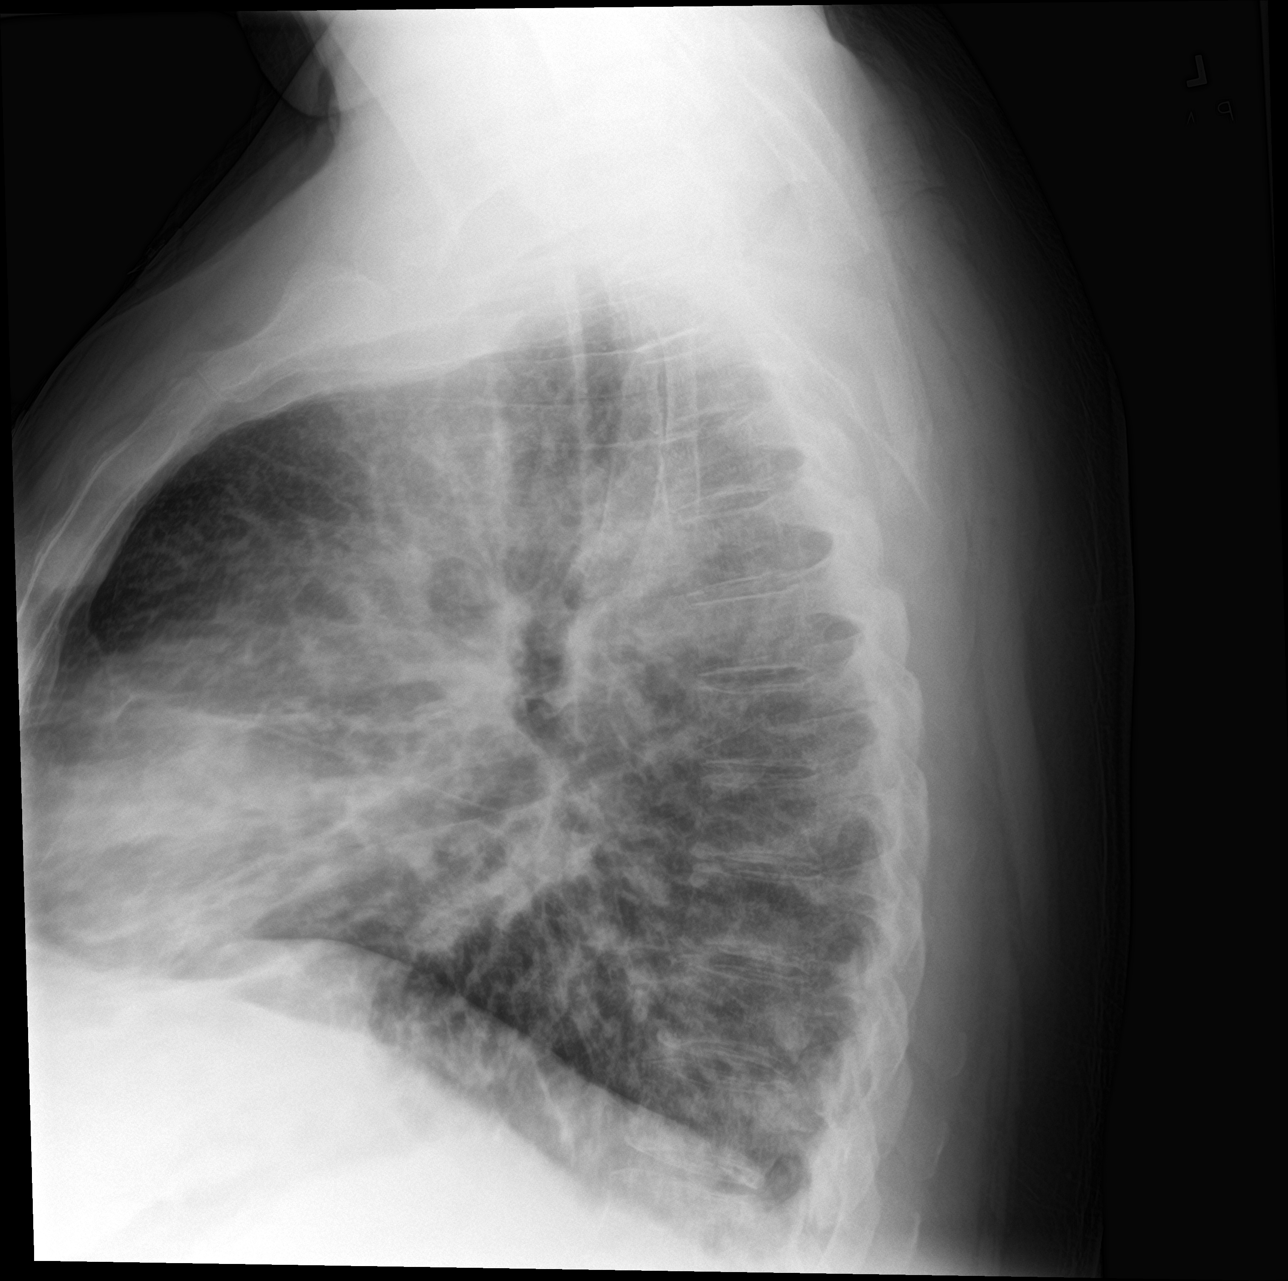

[chest lat (2 of 2)]
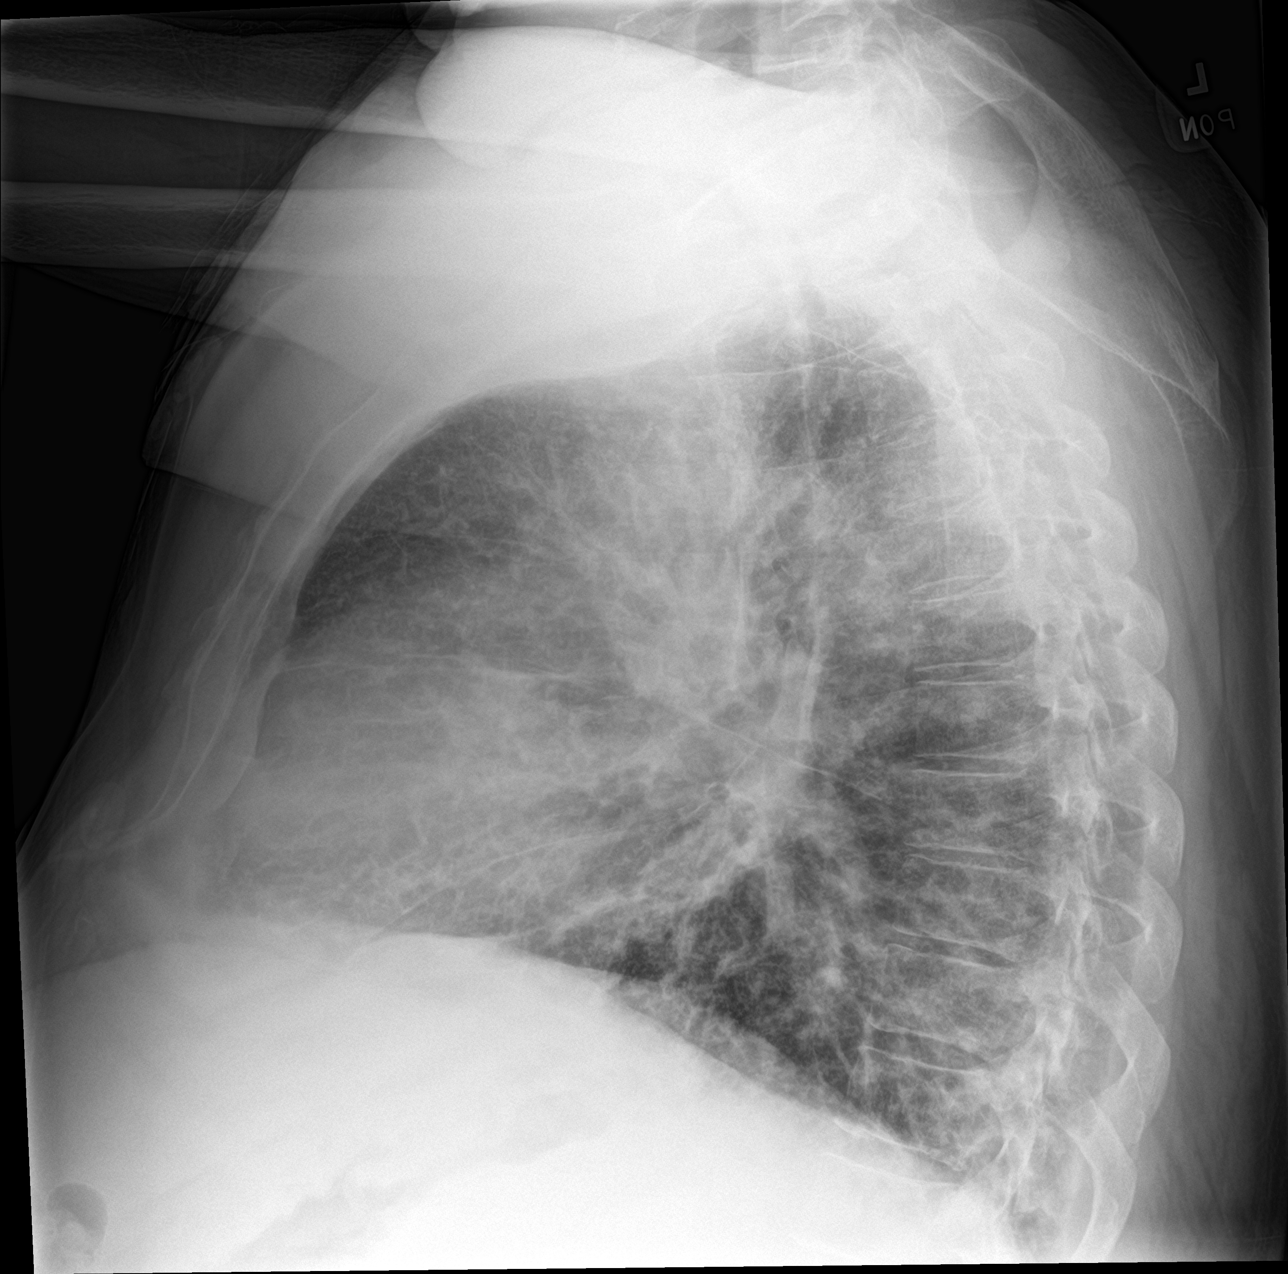

[3 of 3 positions shown; findings below may reference images not displayed]

FINDINGS: The heart size and mediastinal contours are stable without definite
adenopathy. There are recurrent patchy ground-glass and airspace
opacities at both lung bases and in the right upper lobe consistent
with multilobar pneumonia. This could be viral in etiology. There is
no pleural effusion or pneumothorax. No acute osseous findings are
seen.
IMPRESSION: Recurrent bibasilar and right upper lobe ground-glass and airspace
opacities consistent with multilobar pneumonia, possibly viral in
etiology.

## 2020-06-01 IMAGING — US US RENAL
1 series · 14 of 25 positions shown · non-contrast
Comparison: Body CT [DATE]

CLINICAL DATA: Chronic renal disease.

EXAM:
RENAL / URINARY TRACT ULTRASOUND COMPLETE

[Series 1: us renal · 14 of 31 slices shown]
[im 1/31]
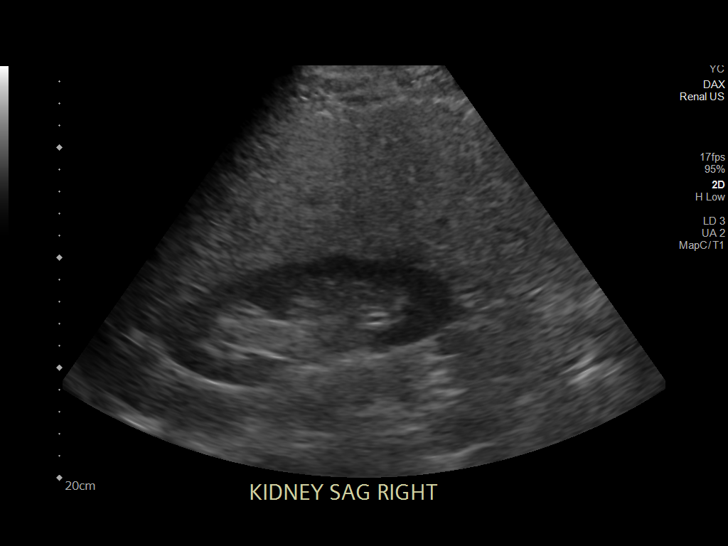
[im 3/31]
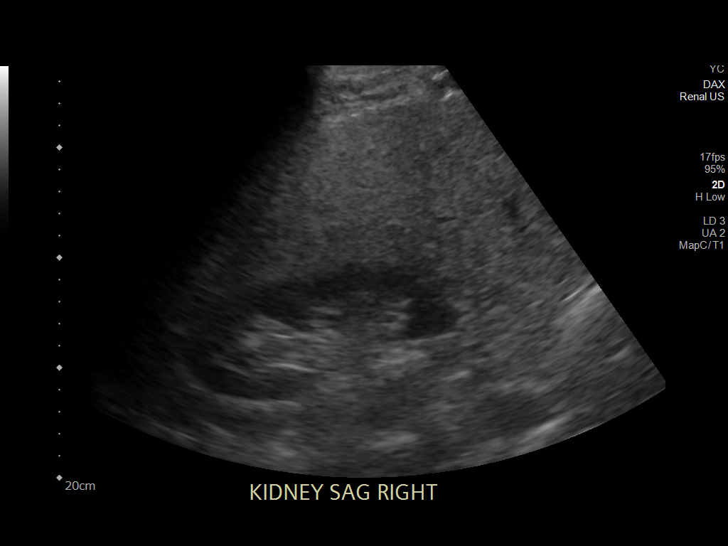
[im 6/31]
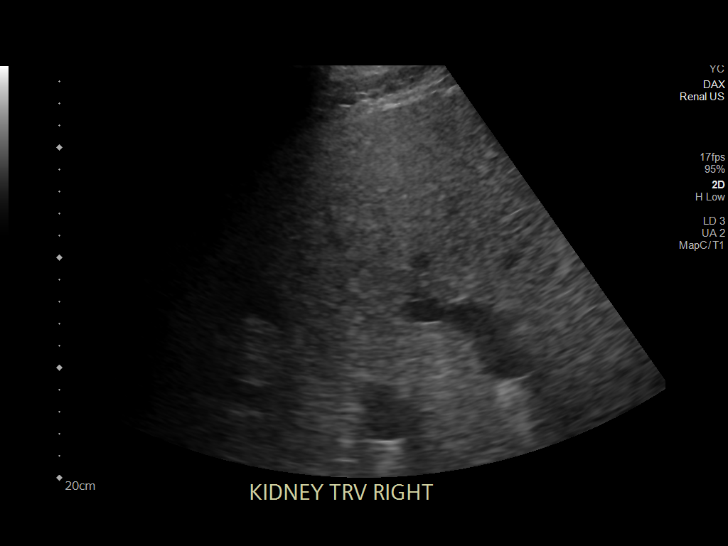
[im 8/31]
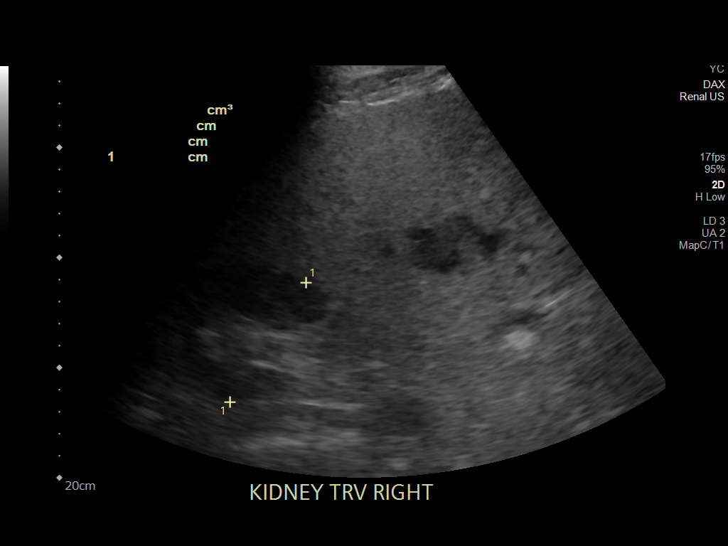
[im 11/31]
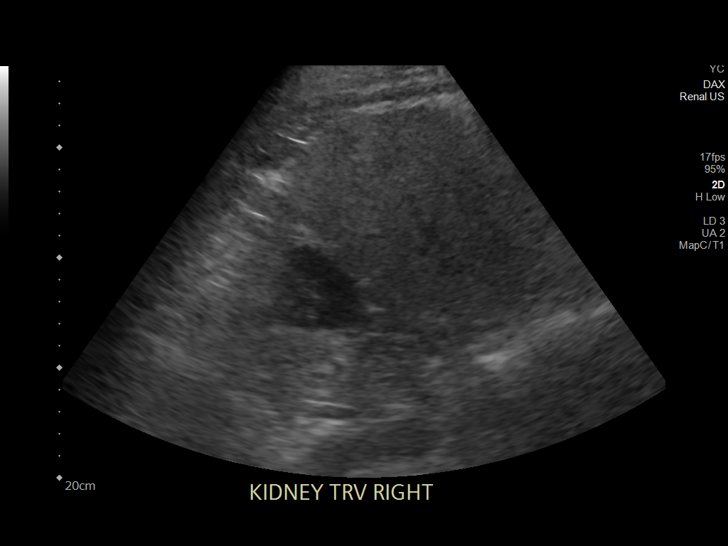
[im 12/31]
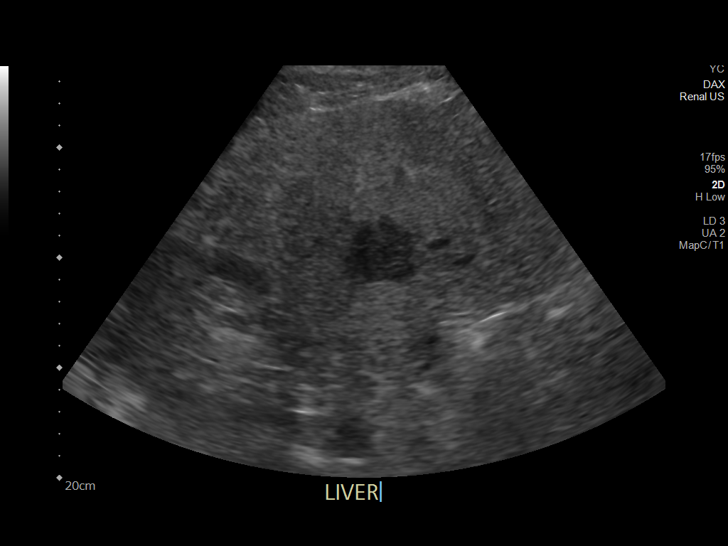
[im 14/31]
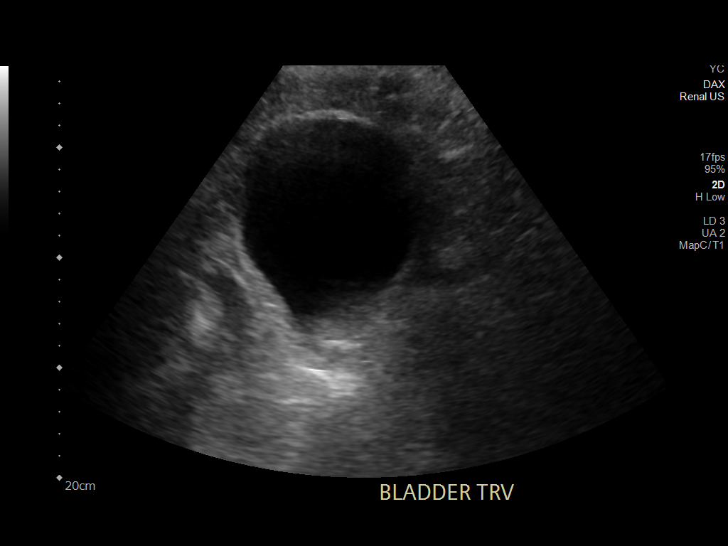
[im 17/31]
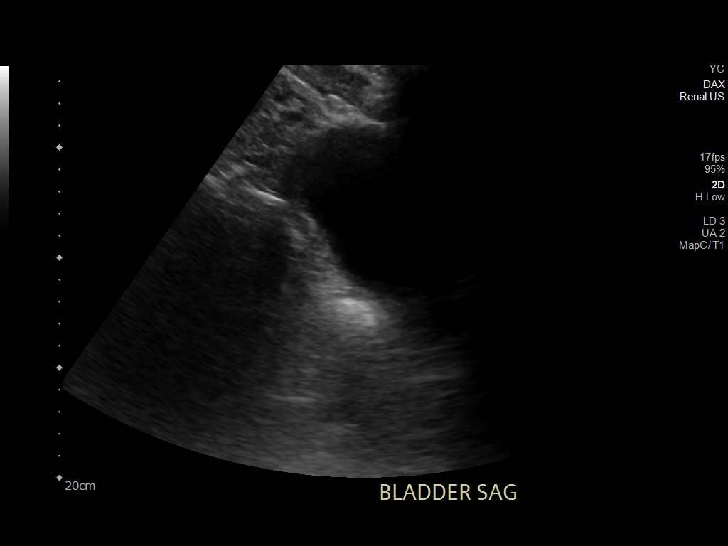
[im 19/31]
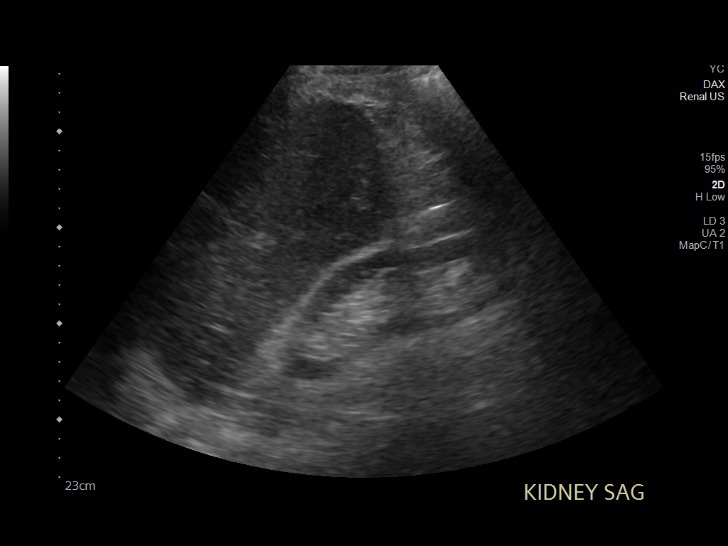
[im 21/31]
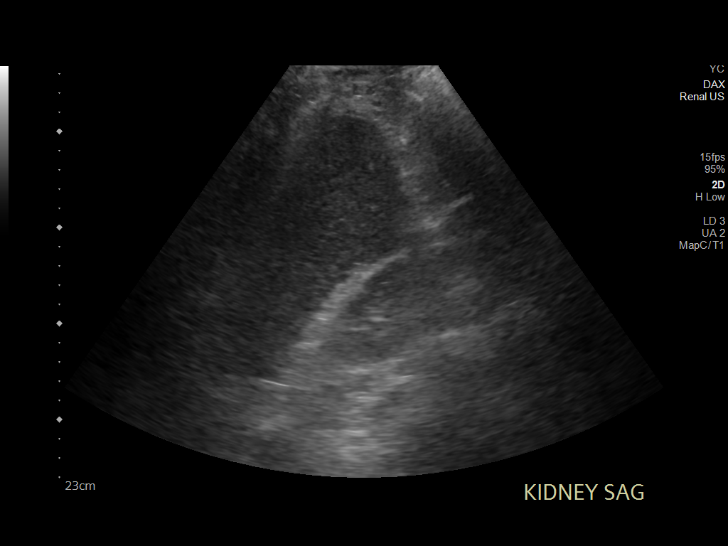
[im 23/31]
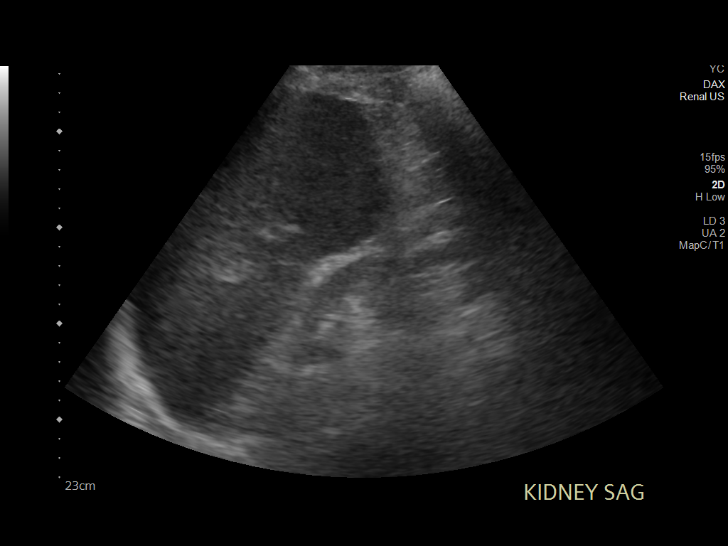
[im 26/31]
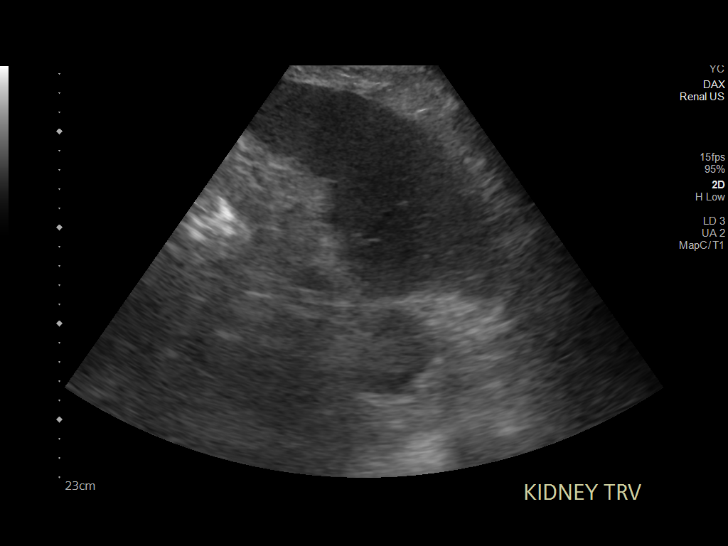
[im 28/31]
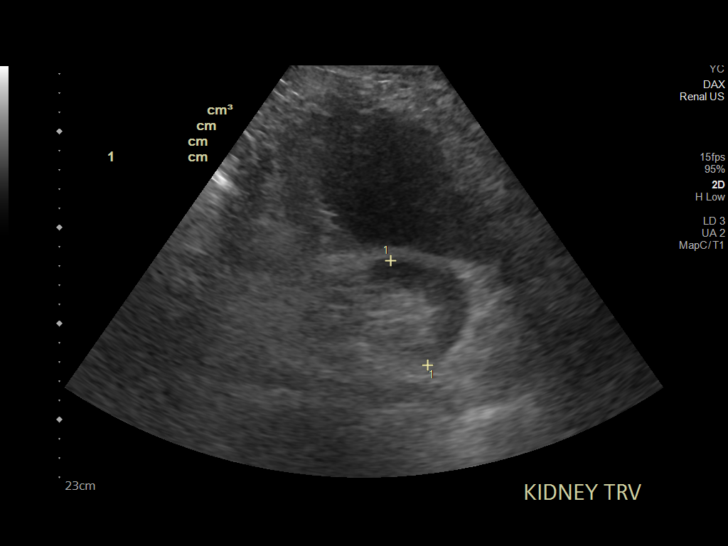
[im 31/31]
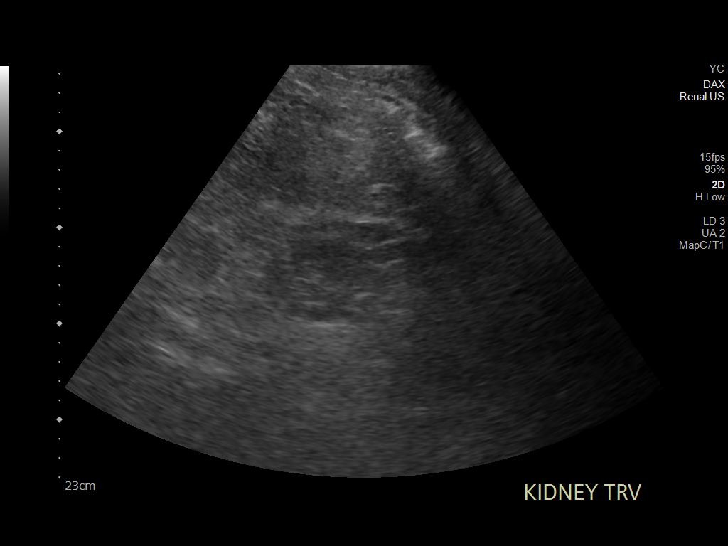

[14 of 25 positions shown; findings below may reference images not displayed]

FINDINGS: Right Kidney:

Renal measurements: 13.6 x 4.8 x 6.4 cm = volume: 217.9 mL.
Echogenicity within normal limits. No mass or hydronephrosis
visualized.

Left Kidney:

Renal measurements: 13.1 x 4.6 x 4.8 cm = volume: 183.8 mL.
Echogenicity within normal limits. No mass or hydronephrosis
visualized.

Bladder:

Appears normal for degree of bladder distention.

Other:

Limited visualization of hypoechoic lobulated mass in the right lobe
of the liver measuring 2.8 x 2.8 cm.
IMPRESSION: 1. Normal appearance of the kidneys and urinary bladder.
2. Limited visualization of hypoechoic lobulated mass in the right
lobe of the liver measuring 2.8 cm. Further evaluation with CT or
MRI of the abdomen, liver protocol, may be considered on a
nonemergent basis.

## 2020-06-01 MED ORDER — TRAMADOL HCL 50 MG PO TABS
50.0000 mg | ORAL_TABLET | Freq: Four times a day (QID) | ORAL | Status: DC | PRN
  Administered 2020-06-01 – 2020-06-10 (×12): 50 mg via ORAL
  Filled 2020-06-01 (×13): qty 1

## 2020-06-01 MED ORDER — ACETAMINOPHEN 325 MG PO TABS
650.0000 mg | ORAL_TABLET | Freq: Four times a day (QID) | ORAL | Status: DC | PRN
  Administered 2020-06-03 (×2): 650 mg via ORAL
  Filled 2020-06-01 (×2): qty 2

## 2020-06-01 MED ORDER — ONDANSETRON HCL 4 MG PO TABS: 4.0000 mg | ORAL_TABLET | Freq: Four times a day (QID) | ORAL | Status: DC | PRN

## 2020-06-01 MED ORDER — SODIUM CHLORIDE 0.9 % IV SOLN
1.0000 g | Freq: Once | INTRAVENOUS | Status: AC
  Administered 2020-06-01: 1 g via INTRAVENOUS
  Filled 2020-06-01: qty 10

## 2020-06-01 MED ORDER — LACTATED RINGERS IV BOLUS
500.0000 mL | Freq: Once | INTRAVENOUS | Status: AC
  Administered 2020-06-01: 500 mL via INTRAVENOUS

## 2020-06-01 MED ORDER — TAMSULOSIN HCL 0.4 MG PO CAPS
0.4000 mg | ORAL_CAPSULE | Freq: Every day | ORAL | Status: DC
  Administered 2020-06-01 – 2020-06-10 (×10): 0.4 mg via ORAL
  Filled 2020-06-01 (×10): qty 1

## 2020-06-01 MED ORDER — SERTRALINE HCL 100 MG PO TABS
200.0000 mg | ORAL_TABLET | Freq: Every day | ORAL | Status: DC
  Administered 2020-06-01 – 2020-06-10 (×10): 200 mg via ORAL
  Filled 2020-06-01 (×11): qty 2

## 2020-06-01 MED ORDER — FUROSEMIDE 40 MG PO TABS
40.0000 mg | ORAL_TABLET | Freq: Two times a day (BID) | ORAL | Status: DC
  Administered 2020-06-01 – 2020-06-05 (×8): 40 mg via ORAL
  Filled 2020-06-01 (×8): qty 1

## 2020-06-01 MED ORDER — MAGNESIUM SULFATE 2 GM/50ML IV SOLN
2.0000 g | Freq: Once | INTRAVENOUS | Status: AC
  Administered 2020-06-01: 2 g via INTRAVENOUS
  Filled 2020-06-01: qty 50

## 2020-06-01 MED ORDER — SODIUM CHLORIDE 0.9 % IV SOLN
500.0000 mg | Freq: Once | INTRAVENOUS | Status: AC
  Administered 2020-06-01: 500 mg via INTRAVENOUS
  Filled 2020-06-01: qty 500

## 2020-06-01 MED ORDER — ASPIRIN EC 81 MG PO TBEC
81.0000 mg | DELAYED_RELEASE_TABLET | Freq: Every day | ORAL | Status: DC
  Administered 2020-06-01 – 2020-06-10 (×10): 81 mg via ORAL
  Filled 2020-06-01 (×10): qty 1

## 2020-06-01 MED ORDER — METOPROLOL SUCCINATE ER 50 MG PO TB24
50.0000 mg | ORAL_TABLET | Freq: Every day | ORAL | Status: DC
  Administered 2020-06-01 – 2020-06-10 (×10): 50 mg via ORAL
  Filled 2020-06-01 (×10): qty 1

## 2020-06-01 MED ORDER — GABAPENTIN 600 MG PO TABS
600.0000 mg | ORAL_TABLET | Freq: Every day | ORAL | Status: DC
  Administered 2020-06-01 – 2020-06-09 (×9): 600 mg via ORAL
  Filled 2020-06-01 (×10): qty 1

## 2020-06-01 MED ORDER — ACETAMINOPHEN 650 MG RE SUPP: 650.0000 mg | Freq: Four times a day (QID) | RECTAL | Status: DC | PRN

## 2020-06-01 MED ORDER — INSULIN ASPART 100 UNIT/ML ~~LOC~~ SOLN
0.0000 [IU] | Freq: Three times a day (TID) | SUBCUTANEOUS | Status: DC
  Administered 2020-06-02: 15 [IU] via SUBCUTANEOUS
  Administered 2020-06-02: 11 [IU] via SUBCUTANEOUS

## 2020-06-01 MED ORDER — ONDANSETRON HCL 4 MG/2ML IJ SOLN: 4.0000 mg | Freq: Four times a day (QID) | INTRAMUSCULAR | Status: DC | PRN

## 2020-06-01 MED ORDER — POLYETHYLENE GLYCOL 3350 17 G PO PACK
17.0000 g | PACK | Freq: Every day | ORAL | Status: DC | PRN
  Filled 2020-06-01: qty 1

## 2020-06-01 MED ORDER — INSULIN GLARGINE 100 UNIT/ML ~~LOC~~ SOLN
30.0000 [IU] | Freq: Every day | SUBCUTANEOUS | Status: DC
  Administered 2020-06-01: 30 [IU] via SUBCUTANEOUS
  Filled 2020-06-01 (×3): qty 0.3

## 2020-06-01 MED ORDER — AMLODIPINE BESYLATE 10 MG PO TABS
10.0000 mg | ORAL_TABLET | Freq: Every day | ORAL | Status: DC
  Administered 2020-06-01 – 2020-06-10 (×10): 10 mg via ORAL
  Filled 2020-06-01 (×11): qty 1

## 2020-06-01 MED ORDER — ATORVASTATIN CALCIUM 40 MG PO TABS
40.0000 mg | ORAL_TABLET | Freq: Every day | ORAL | Status: DC
  Administered 2020-06-01 – 2020-06-03 (×3): 40 mg via ORAL
  Filled 2020-06-01 (×3): qty 1

## 2020-06-01 NOTE — H&P (Signed)
Date: 06/01/2020               Patient Name:  Nicholas Rice MRN: [id number]  DOB: Apr 15, 1956 Age / Sex: 64 y.o., male   PCP: Cher Nakai, MD         Medical Service: Internal Medicine Teaching Service         Attending Physician: Dr. Noemi Chapel, MD    First Contact: Dr. Lisabeth Devoid Pager: [phone]  Second Contact: Blenda Nicely Pager: [facility] [phone])       After Hours (After 5p/  First Contact Pager: [phone]  weekends / holidays): Second Contact Pager: [phone]   Chief Complaint: [place]  History of Present Illness:   Nicholas Rice is a 64 y.o. male with a pertinent PMH of CKD stage 3, HTN, T2DM, Anemia, GERD, BPH, HLD, CAD (previously told he has angina) who presented with a 1 month history SHOB.   Patient states that over the past month he has been experiencing worsening shortness of breath with lower extremity edema.  He was intimally concerned that it was related to his heart, but had extensive work up that was negative for heart related causes. Over the last week he has started to have progressive worsening SHOB with associated chills, fever and cough. He states that he has had pneumonia previously and believes his symptoms are consistent with pneumonia. He states that today he develop some chest pain today that was diffuse across his chest that was worsened by taking a deep breath. Although he currently denies having chest pain. He admits to nausea, lower extremity edema, knee pain, and decreased exercise tolerance.    ED Course: In the ED the patient was found to have a mildly elevated BNP at 184.  Troponins were flat.  Patient did not have a leukocytosis nor lactic acidosis.  Patient's chest x-ray was significant for multifocal pneumonia.    Lab Orders     SARS Coronavirus 2 by RT PCR (hospital order, performed in [facility] hospital lab) Nasopharyngeal Nasopharyngeal Swab     Basic metabolic panel     CBC     Lactic acid, plasma     Brain natriuretic peptide      Comprehensive metabolic panel     Urinalysis, Routine w reflex microscopic     Lipase, blood     Magnesium     Hemoglobin and hematocrit, blood     POC occult blood, ED Provider will collect   Meds:  No outpatient medications have been marked as taking for the 06/01/20 encounter [facility] Encounter).    Social:  Lives in [place]. 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 No married. Son lives 6 miles from him.   Use to smoke cigarette 64 yo ago. No etoh, no drug  Family History:  Dad - DM, CAD, CHF, Died at 49 y.o. Mom - COPD, CAD, DM, Died at 35 y.o.  Allergies: Allergies as of 06/01/2020 - Review Complete 06/01/2020  Allergen Reaction Noted  . Loratadine Rash 11/20/2014   Past Medical History:  Diagnosis Date  . Anemia   . Benign prostatic hyperplasia   . CAD in native artery   . Depression   . Diabetic neuropathy ([place])   . Diastolic heart failure ([place])   . Edema   . Enlarged prostate   . GERD (gastroesophageal reflux disease)   . Gout   . Hyperlipidemia   . Hypertension   . Hypogonadism in male   . Muscle cramp   .  Osteoarthritis   . Pancytopenia ([place]) 01/02/2015  . Peripheral neuropathy   . 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 Renal insufficiency   . Stage 3 chronic kidney disease   . Type 2 diabetes with complication (HCC)      Review of Systems: A complete ROS was negative except as per HPI.   Physical Exam: Blood pressure (!) 150/67, pulse 79, temperature 98.9 F (37.2 C), temperature source Oral, resp. rate 19, SpO2 100 %. Physical Exam Constitutional:      General: He is not in acute distress.    Appearance: He is obese. He is not diaphoretic.  HENT:     Head: Normocephalic and atraumatic.  Eyes:     Extraocular Movements: Extraocular movements intact.  Cardiovascular:     Rate and Rhythm: Normal rate and regular rhythm.     Pulses: Normal pulses.     Heart sounds: Murmur heard.   Pulmonary:     Effort: Pulmonary effort is  normal. No respiratory distress.     Breath sounds: Rales (right >left) present. No wheezing.  Abdominal:     General: There is distension.     Tenderness: There is no abdominal tenderness. There is no guarding.  Musculoskeletal:        General: Normal range of motion.     Right lower leg: Edema (2-3+ to knees) present.     Left lower leg: Edema (2-3+ to knees) present.  Skin:    General: Skin is dry.  Neurological:     General: No focal deficit present.     Mental Status: He is alert. Mental status is at baseline.     Labs: CBC    Component Value Date/Time   WBC 4.7 06/01/2020 1050   RBC 3.17 (L) 06/01/2020 1050   HGB 9.8 (L) 06/01/2020 1736   HCT 31.5 (L) 06/01/2020 1736   PLT 78 (L) 06/01/2020 1050   MCV 96.8 06/01/2020 1050   MCH 30.9 06/01/2020 1050   MCHC 31.9 06/01/2020 1050   RDW 15.5 06/01/2020 1050   LYMPHSABS 1.5 02/10/2009 1120   MONOABS 0.4 02/10/2009 1120   EOSABS 0.1 02/10/2009 1120   BASOSABS 0.0 02/10/2009 1120     CMP     Component Value Date/Time   NA 136 06/01/2020 1050   K 5.4 (H) 06/01/2020 1050   CL 107 06/01/2020 1050   CO2 20 (L) 06/01/2020 1050   GLUCOSE 415 (H) 06/01/2020 1050   BUN 56 (H) 06/01/2020 1050   CREATININE 1.75 (H) 06/01/2020 1050   CALCIUM 8.9 06/01/2020 1050   GFRNONAA 40 (L) 06/01/2020 1050   GFRAA 46 (L) 06/01/2020 1050    ECHO (05/23/20) 1. Left ventricular ejection fraction, by estimation, is 60 to 65%. The  left ventricle has normal function. The left ventricle has no regional  wall motion abnormalities. Left ventricular diastolic parameters are  consistent with Grade I diastolic  dysfunction (impaired relaxation).  2. Right ventricular systolic function is normal. The right ventricular  size is normal. There is normal pulmonary artery systolic pressure.  3. The mitral valve is normal in structure. No evidence of mitral valve  regurgitation. No evidence of mitral stenosis.  4. The aortic valve is tricuspid.  Aortic valve regurgitation is not  visualized. Mild aortic valve sclerosis is present, with no evidence of  aortic valve stenosis.  5. There is mild dilatation of the ascending aorta measuring 37 mm.  6. The inferior vena cava is normal in  size with greater than 50%  respiratory variability, suggesting right atrial pressure of 3 mmHg.  Nuclear Stress Test: (05/23/20) - Nuclear stress EF: 70%. The left ventricular ejection fraction is hyperdynamic (>65%) - There was no ST segment deviation noted during stress. - This is a low risk study. There is no evidence of infarction and no evidence of ischemia. - The study is normal.  Imaging: DG Chest 2 View Recurrent bibasilar and right upper lobe ground-glass and airspace opacities consistent with multilobar pneumonia, possibly viral in etiology.   EKG: personally reviewed my interpretation is Sinus rhythm with LVH and T wave inversion in lead 3.    Assessment & Plan by Problem: Active Problems:   * No active hospital problems. *   Nicholas Rice is a 64 y.o. with pertinent PMH of CKD stage 3, HTN, T2DM, Anemia, GERD, BPH, HLD, CAD (previously told he has angina) HFpEF (EF 60%) who presented with a 1 month history of shortness of breath progressively got worse over the last week and admit for community-acquired pneumonia complicated by acute kidney injury on hospital day 0  #CAP Patient presents with 1 month history of shortness of breath with exertion that progressively got worse over the last week.  He does admit to fevers, chills, and cough during this time.  Patient is hemodynamically stable without leukocytosis or lactic acidosis.  However, he does have imaging consistent with multilobar pneumonia.  He has been given a single dose of azithromycin and ceftriaxone in the ED and will continue this in the inpatient setting.  -Procalcitonin pending -Blood cultures pending -Continue CAP coverage with azithromycin and ceftriaxone  #HFpEF (EF of  60 - 65%) Patient has a history of heart failure with preserved ejection fraction.  Recently had an echo showing an EF of 60% without regional wall motion abnormalities or valvular dysfunction.  Recent stress test did not show any evidence of ischemic cardiomyopathy.  Patient does currently take Lasix for lower extremity edema which is likely secondary to venous insufficiency.  #Possible AKI vs CKD Cr of 1.47 and GFR of 49 without a baseline for comparison. There is some note of possible CKD stage III in his chart.  - Will hold ACEI  - Avoid nephrotoxic medications  - Renal US pending - Strict I&O  #T2DM Glucose of 355  - Lantus 30 unit qHS - SSI moderate - Hgb A1c pending  #Hyperkalemia: Patient presents with an elevated creatinine and a potassium of 5.4.  Patient does take oral potassium at home.  #Normocytic Anemia:  Hgb of 9.8 MCV of 96 with unknown baseline - Iron, Ferritin, TIBC,  Reticulocyte, Smear pending  -Transfusion goal less than 7.  Thrombocytopenia: Patient has a platelet count of 78.  No obvious reason for thrombocytopenia. -Screen for HIV -We will perform reticulocyte count and smear -We will recheck CBC to make sure platelet count is correct. -Transfusion goals less than 50 with concern for bleeding and less than 20 otherwise.  #CAD: #HLF:   #HTN: -Continue home atorvastatin 40 mg medication. - Continue Amlodipine 10 mg - Continue Metoprolol 40 mg  - Hold LSN-HCTZ 20-12.5 mg   #Depression: - Continue home SSRI   #BPH  - Continue home Flomax   Diet: Carb-Modified VTE: SCDs IVF: None,None Code: Full  Prior to Admission Living Arrangement: Home, living by himself Anticipated Discharge Location: Home Barriers to Discharge: further eval  Dispo: Admit patient to Inpatient with expected length of stay greater than 2 midnights.  Signed: Marianna Payment, MD  06/01/2020, 6:15 PM  Pager: [phone]

## 2020-06-01 NOTE — ED Provider Notes (Signed)
Medical screening examination/treatment/procedure(s) were conducted as a shared visit with non-physician practitioner(s) and myself.  I personally evaluated the patient during the encounter.  Clinical Impression:   Final diagnoses:  Multifocal pneumonia  Hypoxia  AKI (acute kidney injury) ([place])  Hyperkalemia    This patient is a warm very obese 64 year old male, very pleasant, history of chronic edema of his legs, reports that he has had no coronary disease in the past that he is aware of and has had negative stress test.  He presents with increasing coughing and shortness of breath, his lower extremity edema has been significant and severe at times.  He has been followed by his doctor, Dr. Truman Hayward and Tia Alert for this.  His shortness of breath and coughing is gotten worse, on exam he does have some scattered rales with deep breathing, he speaks in shortened sentences, mildly tachypneic, oxygen desaturates significantly while he walks.  Covid is negative, no leukocytosis but he is thrombocytopenic and has worsening renal function.  We will start antibiotics to cover for community-acquired pneumonia, the patient will need to be admitted to the hospital  I discussed the care with the IM physician who will admit -  Pt agreeable.   Noemi Chapel, MD 06/02/20 2018

## 2020-06-01 NOTE — ED Notes (Signed)
ED Provider at bedside. 

## 2020-06-01 NOTE — ED Triage Notes (Signed)
Patient complains of dry cough, chest soreness and fatigue x 3 days. Has not been vaccinated, patient alert and oriented. Has hx of pneumonia

## 2020-06-01 NOTE — ED Provider Notes (Signed)
[facility] EMERGENCY DEPARTMENT Provider Note   CSN: [id number] Arrival date & time: 06/01/20  1017     History No chief complaint on file.   Nicholas Rice is a 64 y.o. male.  HPI      Nicholas Rice is a 64 y.o. male, with a history of diastolic heart failure, anemia, GERD, gout, hyperlipidemia, HTN, DM type II, CKD stage III, presenting to the ED primarily complaining of shortness of breath worsening over the last 3 days. He states he has had similar symptoms with pneumonia in the past. Also endorses nonproductive cough as well as upper abdominal pressure, worse with deep breathing.  He has diarrhea and overall loose stools daily that he attributes to Metformin.  He has had no changes in this. He has had lower extremity edema and associated discomfort bilaterally for several months, worse over the last month.  He has seen his PCP for this issue and his Lasix has recently been increased to 40 mg 3 times daily. Denies use of alcohol, tobacco, or illicit drugs.  He does take NSAIDs on a daily basis in the form of meloxicam and has done so for several years. He has not been vaccinated against Covid. Denies known fever, N/V, other abdominal pain, difficulty urinating, hematuria, hematochezia/melena, dizziness, syncope, or any other complaints.   Past Medical History:  Diagnosis Date  . Anemia   . Benign prostatic hyperplasia   . CAD in native artery   . Depression   . Diabetic neuropathy ([place])   . Diastolic heart failure ([place])   . Edema   . Enlarged prostate   . GERD (gastroesophageal reflux disease)   . Gout   . Hyperlipidemia   . Hypertension   . Hypogonadism in male   . Muscle cramp   . Osteoarthritis   . Pancytopenia ([place]) 01/02/2015  . Peripheral neuropathy   . 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 Renal insufficiency   . Stage 3 chronic kidney disease   . Type 2 diabetes with complication [facility])     Patient Active Problem List    Diagnosis Date Noted  . Renal insufficiency   . Osteoarthritis   . Hypertension   . Hyperlipidemia   . Diabetes ([place])   . Anemia   . Enlarged prostate   . Depression   . Peripheral neuropathy   . Edema   . Type 2 diabetes with complication ([place])   . Stage 3 chronic kidney disease   . Proteinuria due to type 2 diabetes mellitus ([place])   . Muscle cramp   . Hypogonadism in male   . Gout   . GERD (gastroesophageal reflux disease)   . Diastolic heart failure ([place])   . Diabetic neuropathy ([place])   . CAD in native artery   . Benign prostatic hyperplasia   . Pancytopenia ([place]) 01/02/2015    Past Surgical History:  Procedure Laterality Date  . CATARACT EXTRACTION Bilateral   . CHOLECYSTECTOMY  1981  . [place]  . TONSILLECTOMY  1963       Family History  Problem Relation Age of Onset  . Diabetes Mother   . Emphysema Mother   . Congestive Heart Failure Mother   . Diabetes Father   . Heart disease Father   . Heart attack Father     Social History   Tobacco Use  . Smoking status: Former Research scientist (life sciences)  . Smokeless tobacco: Never Used  Substance Use Topics  .  Alcohol use: Never  . Drug use: Never    Home Medications Prior to Admission medications   Medication Sig Start Date End Date Taking? Authorizing Provider  ACCU-CHEK GUIDE test strip  01/11/20   [provider]  Accu-Chek Softclix Lancets lancets  01/11/20   [provider]  amLODipine (NORVASC) 5 MG tablet  01/03/20   [provider]  APPLE CIDER VINEGAR PO Take by mouth.    [provider]  aspirin EC 81 MG tablet Take 81 mg by mouth daily. Swallow whole.    [provider]  atorvastatin (LIPITOR) 40 MG tablet Take 40 mg by mouth daily.    [provider]  b complex vitamins capsule Take 1 capsule by mouth daily.    [provider]  Blood Glucose Monitoring Suppl (ACCU-CHEK GUIDE) w/Device KIT  01/11/20   [provider]  ferrous  sulfate 325 (65 FE) MG EC tablet Take 325 mg by mouth. One a day.    [provider]  furosemide (LASIX) 40 MG tablet Take 40 mg by mouth 2 (two) times daily. 09/27/18   [provider]  gabapentin (NEURONTIN) 600 MG tablet Take 600 mg by mouth at bedtime.  01/03/20   [provider]  glipiZIDE (GLUCOTROL XL) 10 MG 24 hr tablet Take 10 mg by mouth 2 (two) times daily. 09/30/18   [provider]  insulin NPH Human (NOVOLIN N) 100 UNIT/ML injection Inject 140 Units into the skin in the morning, at noon, and at bedtime.    [provider]  lisinopril-hydrochlorothiazide (PRINZIDE,ZESTORETIC) 20-12.5 MG tablet Take 1 tablet by mouth in the morning and at bedtime.     [provider]  magnesium oxide (MAG-OX) 400 MG tablet Take 400 mg by mouth daily. 4 a day.    [provider]  meloxicam (MOBIC) 15 MG tablet TAKE 1 TABLET BY MOUTH ONCE DAILY WITH MEALS AS NEEDED 09/27/18   [provider]  metFORMIN (GLUCOPHAGE) 850 MG tablet Take 850 mg by mouth 3 (three) times daily. 09/29/18   [provider]  Misc Natural Products ([place]) Take by mouth daily.    [provider]  pantoprazole (PROTONIX) 40 MG tablet Take 40 mg by mouth daily. 09/27/18   [provider]  Potassium 99 MG TABS Take by mouth.    [provider]  sertraline (ZOLOFT) 100 MG tablet Take 200 mg by mouth daily.  02/08/20   [provider]  tamsulosin (FLOMAX) 0.4 MG CAPS capsule Take 0.4 mg by mouth daily. 09/27/18   [provider]  TOUJEO MAX SOLOSTAR 300 UNIT/ML Solostar Pen 50 Units at bedtime. 02/11/20   [provider]  traMADol (ULTRAM) 50 MG tablet Take 50 mg by mouth 4 (four) times daily as needed.  09/09/18   [provider]  Vitamin D, Ergocalciferol, (DRISDOL) 1.25 MG (50000 UNIT) CAPS capsule Take 50,000 Units by mouth every 7 (seven) days.    [provider]    Allergies      Loratadine  Review of Systems   Review of Systems  Constitutional: Negative for chills, diaphoresis and fever.  Respiratory: Positive for cough and shortness of breath.   Cardiovascular: Positive for leg swelling (not acute). Negative for chest pain.  Gastrointestinal: Positive for abdominal pain (abdominal pressure). Negative for blood in stool, diarrhea (none acute), nausea and vomiting.  Genitourinary: Negative for difficulty urinating, dysuria and hematuria.  Musculoskeletal: Negative for back pain.  Neurological: Negative for  dizziness, syncope, weakness and numbness.  All other systems reviewed and are negative.   Physical Exam Updated Vital Signs BP (!) 163/69   Pulse 82   Temp 98.9 F (37.2 C) (Oral)   Resp 19   SpO2 98%   Physical Exam Vitals and nursing note reviewed.  Constitutional:      Appearance: He is well-developed. He is obese. He is not diaphoretic.  HENT:     Head: Normocephalic and atraumatic.     Mouth/Throat:     Mouth: Mucous membranes are moist.     Pharynx: Oropharynx is clear.  Eyes:     Conjunctiva/sclera: Conjunctivae normal.  Cardiovascular:     Rate and Rhythm: Normal rate and regular rhythm.     Pulses: Normal pulses.          Radial pulses are 2+ on the right side and 2+ on the left side.       Posterior tibial pulses are 2+ on the right side and 2+ on the left side.     Heart sounds: Normal heart sounds.     Comments: Tactile temperature in the extremities appropriate and equal bilaterally. Pulmonary:     Breath sounds: Normal breath sounds.     Comments: Patient becomes tachypneic with increased work of breathing with even mild exertion, such as change in position or speaking multiple sentences.  In these cases, his SPO2 is also noted to drop to 90% on room air. Abdominal:     General: There is distension.     Palpations: Abdomen is soft.     Tenderness: There is no abdominal tenderness. There is no guarding.     Comments: Patient  states the fullness in his abdomen is not new, however, he has more upper abdominal discomfort since his shortness of breath began.  Genitourinary:    Rectum: Guaiac result positive.     Comments: Rectal Exam:  No external hemorrhoids, fissures, or lesions noted.  No frank blood or melena. No stool burden.  No rectal tenderness. No foreign bodies noted.   Musculoskeletal:     Cervical back: Neck supple.     Right lower leg: 2+ Pitting Edema present.     Left lower leg: 2+ Pitting Edema present.  Lymphadenopathy:     Cervical: No cervical adenopathy.  Skin:    General: Skin is warm and dry.  Neurological:     Mental Status: He is alert.  Psychiatric:        Mood and Affect: Mood and affect normal.        Speech: Speech normal.        Behavior: Behavior normal.     ED Results / Procedures / Treatments   Labs (all labs ordered are listed, but only abnormal results are displayed) Labs Reviewed  BASIC METABOLIC PANEL - Abnormal; Notable for the following components:      Result Value   Potassium 5.4 (*)    CO2 20 (*)    Glucose, Bld 415 (*)    BUN 56 (*)    Creatinine, Ser 1.75 (*)    GFR calc non Af Amer 40 (*)    GFR calc Af Amer 46 (*)    All other components within normal limits  CBC - Abnormal; Notable for the following components:   RBC 3.17 (*)    Hemoglobin 9.8 (*)    HCT 30.7 (*)    Platelets 78 (*)    All other components within normal limits  COMPREHENSIVE METABOLIC  PANEL - Abnormal; Notable for the following components:   Potassium 5.4 (*)    CO2 21 (*)    Glucose, Bld 355 (*)    BUN 45 (*)    Creatinine, Ser 1.47 (*)    AST 43 (*)    GFR calc non Af Amer 49 (*)    GFR calc Af Amer 57 (*)    All other components within normal limits  HEMOGLOBIN AND HEMATOCRIT, BLOOD - Abnormal; Notable for the following components:   Hemoglobin 9.8 (*)    HCT 31.5 (*)    All other components within normal limits  SARS CORONAVIRUS 2 BY RT PCR (HOSPITAL ORDER, [facility])  LACTIC ACID, PLASMA  LIPASE, BLOOD  MAGNESIUM  LACTIC ACID, PLASMA  BRAIN NATRIURETIC PEPTIDE  URINALYSIS, ROUTINE W REFLEX MICROSCOPIC  POC OCCULT BLOOD, ED  TROPONIN I (HIGH SENSITIVITY)  TROPONIN I (HIGH SENSITIVITY)    EKG EKG Interpretation  Date/Time:  Sunday June 01 2020 10:36:37 EDT Ventricular Rate:  83 PR Interval:  182 QRS Duration: 78 QT Interval:  370 QTC Calculation: 434 R Axis:   -6 Text Interpretation: Sinus rhythm with Premature atrial complexes Minimal voltage criteria for LVH, may be normal variant ( R in aVL ) Inferior infarct , age undetermined Abnormal ECG No old tracing to compare Confirmed by Noemi Chapel [phone]) on 06/01/2020 5:54:40 PM  Radiology DG Chest 2 View  Result Date: 06/01/2020 CLINICAL DATA:  Cough and shortness of breath for 3 days. History of pneumonia. Reportedly not vaccinated for COVID 19. EXAM: CHEST - 2 VIEW COMPARISON:  Radiographs 10/28/2017.  CT 09/25/2017. FINDINGS: The heart size and mediastinal contours are stable without definite adenopathy. There are recurrent patchy ground-glass and airspace opacities at both lung bases and in the right upper lobe consistent with multilobar pneumonia. This could be viral in etiology. There is no pleural effusion or pneumothorax. No acute osseous findings are seen. IMPRESSION: Recurrent bibasilar and right upper lobe ground-glass and airspace opacities consistent with multilobar pneumonia, possibly viral in etiology. Electronically Signed   By: Richardean Sale M.D.   On: 06/01/2020 13:13    Procedures Procedures (including critical care time)  Medications Ordered in ED Medications  azithromycin (ZITHROMAX) 500 mg in sodium chloride 0.9 % 250 mL IVPB (500 mg Intravenous New Bag/Given 06/01/20 1835)  cefTRIAXone (ROCEPHIN) 1 g in sodium chloride 0.9 % 100 mL IVPB (0 g Intravenous Stopped 06/01/20 1831)    ED Course  I have reviewed the triage vital signs and the  nursing notes.  Pertinent labs & imaging results that were available during my care of the patient were reviewed by me and considered in my medical decision making (see chart for details).  Clinical Course as of Jun 01 1848  Sun Jun 01, 2020  1815 Dr. Sabra Heck, spoke with IM resident. Agrees to come evaluate the patient for admission.   [SJ]    Clinical Course User Index [SJ] , Helane Gunther, PA-C   MDM Rules/Calculators/A&P                            Patient presents with worsening shortness of breath over the last 3 days. Afebrile, but hypoxic with even mild exertion down to 90%. Nontoxic-appearing, afebrile, not tachycardic, not hypotensive.  I reviewed and interpreted the patient's labs and radiological studies. Evidence of multifocal pneumonia on chest x-ray.  Covid negative.  We will cover the patient  with antibiotics. Patient has a few lab abnormalities, however, it should be noted that the most recent labs I have to compare are from 2016. Hemoglobin 9.8, previously 14.  Hemoccult positive, but no frank bleeding on exam. Potassium 5.4.  We will recheck this. Creatinine 1.75 and BUN 56, previously 0.93 and 22, respectively.  He does have a listed history of CKD stage III.   Patient will need to be admitted for his hypoxia down to 90% with exertion in the setting of multifocal pneumonia.  Findings and plan of care discussed with Noemi Chapel, MD. Dr. Sabra Heck personally evaluated and examined this patient.  Vitals:   06/01/20 1335 06/01/20 1529 06/01/20 1630 06/01/20 1715  BP: 124/74 (!) 163/69 (!) 153/38 (!) 150/67  Pulse: (!) 110 82 87 79  Resp: 20 19    Temp: 98.9 F (37.2 C)     TempSrc: Oral     SpO2: 98% 98% 95% 100%    Final Clinical Impression(s) / ED Diagnoses Final diagnoses:  Multifocal pneumonia  Hypoxia  AKI (acute kidney injury) ([place])  Hyperkalemia    Rx / DC Orders ED Discharge Orders    None       Layla Maw 06/01/20 1939    Noemi Chapel, MD 06/02/20 2017

## 2020-06-01 NOTE — Progress Notes (Signed)
Pt arrived to unit.  Lab called. Critical Lab Lactic Acid 2.6. Informed provider.

## 2020-06-02 ENCOUNTER — Inpatient Hospital Stay (HOSPITAL_COMMUNITY): Payer: Medicare HMO

## 2020-06-02 DIAGNOSIS — M7989 Other specified soft tissue disorders: Secondary | ICD-10-CM

## 2020-06-02 DIAGNOSIS — J189 Pneumonia, unspecified organism: Secondary | ICD-10-CM

## 2020-06-02 DIAGNOSIS — I130 Hypertensive heart and chronic kidney disease with heart failure and stage 1 through stage 4 chronic kidney disease, or unspecified chronic kidney disease: Secondary | ICD-10-CM

## 2020-06-02 DIAGNOSIS — M25562 Pain in left knee: Secondary | ICD-10-CM

## 2020-06-02 DIAGNOSIS — K219 Gastro-esophageal reflux disease without esophagitis: Secondary | ICD-10-CM

## 2020-06-02 DIAGNOSIS — I2510 Atherosclerotic heart disease of native coronary artery without angina pectoris: Secondary | ICD-10-CM

## 2020-06-02 DIAGNOSIS — N400 Enlarged prostate without lower urinary tract symptoms: Secondary | ICD-10-CM

## 2020-06-02 DIAGNOSIS — I5030 Unspecified diastolic (congestive) heart failure: Secondary | ICD-10-CM

## 2020-06-02 DIAGNOSIS — M79609 Pain in unspecified limb: Secondary | ICD-10-CM

## 2020-06-02 DIAGNOSIS — N183 Chronic kidney disease, stage 3 unspecified: Secondary | ICD-10-CM

## 2020-06-02 DIAGNOSIS — R160 Hepatomegaly, not elsewhere classified: Secondary | ICD-10-CM

## 2020-06-02 DIAGNOSIS — E785 Hyperlipidemia, unspecified: Secondary | ICD-10-CM

## 2020-06-02 DIAGNOSIS — L570 Actinic keratosis: Secondary | ICD-10-CM

## 2020-06-02 DIAGNOSIS — E1122 Type 2 diabetes mellitus with diabetic chronic kidney disease: Secondary | ICD-10-CM

## 2020-06-02 DIAGNOSIS — M25469 Effusion, unspecified knee: Secondary | ICD-10-CM

## 2020-06-02 DIAGNOSIS — N179 Acute kidney failure, unspecified: Secondary | ICD-10-CM

## 2020-06-02 DIAGNOSIS — M25462 Effusion, left knee: Secondary | ICD-10-CM

## 2020-06-02 DIAGNOSIS — D631 Anemia in chronic kidney disease: Secondary | ICD-10-CM

## 2020-06-02 DIAGNOSIS — D696 Thrombocytopenia, unspecified: Secondary | ICD-10-CM

## 2020-06-02 HISTORY — DX: Effusion, unspecified knee: M25.469

## 2020-06-02 HISTORY — DX: Pneumonia, unspecified organism: J18.9

## 2020-06-02 LAB — GLUCOSE, RANDOM: Glucose, Bld: 490 mg/dL — ABNORMAL HIGH (ref 70–99)

## 2020-06-02 LAB — CBC
HCT: 28.2 % — ABNORMAL LOW (ref 39.0–52.0)
Hemoglobin: 9 g/dL — ABNORMAL LOW (ref 13.0–17.0)
MCH: 30.4 pg (ref 26.0–34.0)
MCHC: 31.9 g/dL (ref 30.0–36.0)
MCV: 95.3 fL (ref 80.0–100.0)
Platelets: 79 10*3/uL — ABNORMAL LOW (ref 150–400)
RBC: 2.96 MIL/uL — ABNORMAL LOW (ref 4.22–5.81)
RDW: 15.3 % (ref 11.5–15.5)
WBC: 4.8 10*3/uL (ref 4.0–10.5)
nRBC: 0 % (ref 0.0–0.2)

## 2020-06-02 LAB — COMPREHENSIVE METABOLIC PANEL
ALT: 38 U/L (ref 0–44)
AST: 36 U/L (ref 15–41)
Albumin: 3.4 g/dL — ABNORMAL LOW (ref 3.5–5.0)
Alkaline Phosphatase: 83 U/L (ref 38–126)
Anion gap: 10 (ref 5–15)
BUN: 38 mg/dL — ABNORMAL HIGH (ref 8–23)
CO2: 18 mmol/L — ABNORMAL LOW (ref 22–32)
Calcium: 8.8 mg/dL — ABNORMAL LOW (ref 8.9–10.3)
Chloride: 103 mmol/L (ref 98–111)
Creatinine, Ser: 1.4 mg/dL — ABNORMAL HIGH (ref 0.61–1.24)
GFR calc Af Amer: >60 mL/min (ref >60)
GFR calc non Af Amer: 52 mL/min — ABNORMAL LOW (ref >60)
Glucose, Bld: 336 mg/dL — ABNORMAL HIGH (ref 70–99)
Potassium: 4.8 mmol/L (ref 3.5–5.1)
Sodium: 131 mmol/L — ABNORMAL LOW (ref 135–145)
Total Bilirubin: 1.5 mg/dL — ABNORMAL HIGH (ref 0.3–1.2)
Total Protein: 6.5 g/dL (ref 6.5–8.1)

## 2020-06-02 LAB — GLUCOSE, CAPILLARY
Glucose-Capillary: 307 mg/dL — ABNORMAL HIGH (ref 70–99)
Glucose-Capillary: 456 mg/dL — ABNORMAL HIGH (ref 70–99)
Glucose-Capillary: 452 mg/dL — ABNORMAL HIGH (ref 70–99)
Glucose-Capillary: 391 mg/dL — ABNORMAL HIGH (ref 70–99)
Glucose-Capillary: 371 mg/dL — ABNORMAL HIGH (ref 70–99)

## 2020-06-02 LAB — HIV ANTIBODY (ROUTINE TESTING W REFLEX): HIV Screen 4th Generation wRfx: NONREACTIVE

## 2020-06-02 MED ORDER — INSULIN ASPART 100 UNIT/ML ~~LOC~~ SOLN
0.0000 [IU] | SUBCUTANEOUS | Status: DC
  Administered 2020-06-02 (×2): 20 [IU] via SUBCUTANEOUS
  Administered 2020-06-03: 11 [IU] via SUBCUTANEOUS
  Administered 2020-06-03: 4 [IU] via SUBCUTANEOUS
  Administered 2020-06-03: 20 [IU] via SUBCUTANEOUS
  Administered 2020-06-03: 15 [IU] via SUBCUTANEOUS

## 2020-06-02 MED ORDER — INSULIN ASPART 100 UNIT/ML ~~LOC~~ SOLN
5.0000 [IU] | Freq: Once | SUBCUTANEOUS | Status: AC
  Administered 2020-06-02: 5 [IU] via SUBCUTANEOUS

## 2020-06-02 MED ORDER — INSULIN ASPART 100 UNIT/ML ~~LOC~~ SOLN
3.0000 [IU] | Freq: Once | SUBCUTANEOUS | Status: AC
  Administered 2020-06-02: 3 [IU] via SUBCUTANEOUS

## 2020-06-02 MED ORDER — SODIUM CHLORIDE 0.9 % IV SOLN
1.0000 g | INTRAVENOUS | Status: AC
  Administered 2020-06-02 – 2020-06-05 (×4): 1 g via INTRAVENOUS
  Filled 2020-06-02 (×4): qty 10

## 2020-06-02 MED ORDER — INSULIN GLARGINE 100 UNIT/ML ~~LOC~~ SOLN
40.0000 [IU] | Freq: Every day | SUBCUTANEOUS | Status: DC
  Administered 2020-06-02: 40 [IU] via SUBCUTANEOUS
  Filled 2020-06-02 (×2): qty 0.4

## 2020-06-02 MED ORDER — IOHEXOL 9 MG/ML PO SOLN
500.0000 mL | ORAL | Status: AC
  Administered 2020-06-03: 500 mL via ORAL

## 2020-06-02 MED ORDER — SODIUM CHLORIDE 0.9 % IV SOLN
500.0000 mg | INTRAVENOUS | Status: AC
  Administered 2020-06-02 – 2020-06-05 (×4): 500 mg via INTRAVENOUS
  Filled 2020-06-02 (×4): qty 500

## 2020-06-02 MED ORDER — ENOXAPARIN SODIUM 40 MG/0.4ML ~~LOC~~ SOLN
40.0000 mg | SUBCUTANEOUS | Status: DC
  Administered 2020-06-02 – 2020-06-10 (×9): 40 mg via SUBCUTANEOUS
  Filled 2020-06-02 (×9): qty 0.4

## 2020-06-02 MED ORDER — INSULIN GLARGINE 100 UNIT/ML ~~LOC~~ SOLN
10.0000 [IU] | Freq: Once | SUBCUTANEOUS | Status: AC
  Administered 2020-06-02: 10 [IU] via SUBCUTANEOUS
  Filled 2020-06-02: qty 0.1

## 2020-06-02 MED ORDER — IOHEXOL 9 MG/ML PO SOLN
ORAL | Status: AC
  Administered 2020-06-02: 500 mL via ORAL
  Filled 2020-06-02: qty 1000

## 2020-06-02 NOTE — Progress Notes (Signed)
Informed resident of repeat CBG of 452

## 2020-06-02 NOTE — Procedures (Addendum)
Knee Arthrocentesis without Injection Procedure Note  Diagnosis: left knee  Indications: Diagnostic  Anesthesia: Lidocaine 1% without epinephrine  Procedure Details   Consent was obtained for the procedure. The joint was prepped with Betadine. A 18 gauge needle was inserted into the superior aspect of the joint from a medial approach to access the suprapatellar pouch. 0 ml of bursa fluid was removed from the joint. The needle was removed and the area cleansed and dressed.  Complications:  None; patient tolerated the procedure well.   Marianna Payment, D.O. [facility], PGY-2 Pager: [phone], Phone: [phone] Date 06/02/2020 Time 9:00 PM

## 2020-06-02 NOTE — Progress Notes (Signed)
HD#1 Subjective:  Overnight Events: admitted overnight.  Feeling a little worse. L knee is more swollen and tender. Now red.  Patient reports that he fell but a nodule and swelling popped up over the past three days. Patient reports that his L. Knee is stiff and tender today worse than yesterday. CBG is still high making patient uncomfortable. Denies fever, CP, headache, abdominal pain, and vomiting.  Objective:  Vital signs in last 24 hours: Vitals:   06/02/20 0154 06/02/20 0501 06/02/20 0733 06/02/20 1135  BP:  (!) 114/55 (!) 112/53 (!) 116/56  Pulse:  74 69 68  Resp:  20 20 20   Temp:  98.5 F (36.9 C) 97.8 F (36.6 C) 97.9 F (36.6 C)  TempSrc:  Oral Oral Oral  SpO2:  96% 94% 91%  Weight: 124.7 kg     Height:       Supplemental O2: Nasal Cannula 2 L SpO2: 91 %   Physical Exam:  Physical Exam Constitutional:      Appearance: Normal appearance.  HENT:     Head: Normocephalic and atraumatic.  Eyes:     Extraocular Movements: Extraocular movements intact.  Cardiovascular:     Rate and Rhythm: Normal rate.     Pulses: Normal pulses.     Heart sounds: Normal heart sounds.  Pulmonary:     Effort: Pulmonary effort is normal.     Breath sounds: Normal breath sounds.  Abdominal:     General: Bowel sounds are normal. There is distension.     Palpations: Abdomen is soft.     Tenderness: There is no abdominal tenderness.  Musculoskeletal:        General: Swelling (left knee effusion with surround rubor and erythema) and tenderness (left knee and lower extremity tenderness. ) present. Normal range of motion.     Cervical back: Normal range of motion.     Right lower leg: Edema (2+ to knees) present.     Left lower leg: Edema (2+ to knees) present.  Skin:    General: Skin is warm and dry.  Neurological:     Mental Status: He is alert and oriented to person, place, and time. Mental status is at baseline.  Psychiatric:        Mood and Affect: Mood normal.     Filed  Weights   06/01/20 2101 06/02/20 0154  Weight: 125.2 kg 124.7 kg     Intake/Output Summary (Last 24 hours) at 06/02/2020 1426 Last data filed at 06/02/2020 1237 Gross per 24 hour  Intake 1460 ml  Output 1950 ml  Net -490 ml   Net IO Since Admission: -490 mL [06/02/20 1426]  Pertinent Labs: CBC Latest Ref Rng & Units 06/02/2020 06/01/2020 06/01/2020  WBC 4.0 - 10.5 K/uL 4.8 - 4.7  Hemoglobin 13.0 - 17.0 g/dL 9.0(L) 9.8(L) 9.8(L)  Hematocrit 39 - 52 % 28.2(L) 31.5(L) 30.7(L)  Platelets 150 - 400 K/uL 79(L) - 78(L)    CMP Latest Ref Rng & Units 06/02/2020 06/02/2020 06/01/2020  Glucose 70 - 99 mg/dL 490(H) 336(H) 355(H)  BUN 8 - 23 mg/dL - 38(H) 45(H)  Creatinine 0.61 - 1.24 mg/dL - 1.40(H) 1.47(H)  Sodium 135 - 145 mmol/L - 131(L) 137  Potassium 3.5 - 5.1 mmol/L - 4.8 5.4(H)  Chloride 98 - 111 mmol/L - 103 107  CO2 22 - 32 mmol/L - 18(L) 21(L)  Calcium 8.9 - 10.3 mg/dL - 8.8(L) 9.1  Total Protein 6.5 - 8.1 g/dL - 6.5 7.2  Total  Bilirubin 0.3 - 1.2 mg/dL - 1.5(H) 1.1  Alkaline Phos 38 - 126 U/L - 83 97  AST 15 - 41 U/L - 36 43(H)  ALT 0 - 44 U/L - 38 42    Pending Labs: None  Imaging: US RENAL 1. Normal appearance of the kidneys and urinary bladder.  2. Limited visualization of hypoechoic lobulated mass in the right lobe of the liver measuring 2.8 cm. Further evaluation with CT or MRI of the abdomen, liver protocol, may be considered on a nonemergent basis.   VAS [place] LOWER EXTREMITY VENOUS (DVT) RIGHT: - No evidence of common femoral vein obstruction.   LEFT: - There is no evidence of deep vein thrombosis in the lower extremity.  *See table(s) above for measurements and observations.    Preliminary    Assessment/Plan:   Active Problems:   * No active hospital problems. *   Patient Summary: Nicholas Rice a 64 y.o.with pertinent PMH of CKD stage 3, HTN, T2DM, Anemia, GERD, BPH, HLD, CAD (previously told he has angina)HFpEF (EF 60%)who presented with a 1 month history of  shortness of breath progressively got worse over the last weekand admit for community-acquired pneumonia complicated by acute kidney injuryon hospital day 0  He is on hospital day 1 and stable.  #CAP Patient is on day 2 of antibiotic therapy with ceftriaxone and azithromycinand tolerating it well.  Patient denies any signs or symptoms of systemic infection such as fevers, chills, diffuse arthralgias.  Patient's shortness of breath has also improved overnight.  Patient does not have a leukocytosis but does have a history of pancytopenia which could be confounding this.  Procalcitonin was within normal limits. -Continue ceftriaxone azithromycin for community-acquired pneumonia with minimum of 3 days of treatment. -Blood cultures pending   Monoarticular Arthritis with effusion: Patient presented with a monoarticular arthritis with significant effusion and surrounding erythema.  Patient also has erythema extending down his lower left leg.  Patient is joining effusion is concerning for gout in setting of previous Infections.  Patient's lower extremity erythema and pain concerning for DVT, but lower extremity ultrasound was within normal limits.  Plan to perform arthrocentesis of left knee today to determine the etiology of his effusion.  Likely gout versus septic joint. -Arthrocentesis today.  #HFpEF (EF of 60 - 65%) -Continue home Lasix.  #Possible AKI vs CKD Patient's kidney function is stable we will continue to hold ACE and avoid nephrotoxic medications when possible.  Patient's renal ultrasound was within normal limits and showed no evidence of medical renal disease.. -Hold ACE inhibitor's and avoid nephrotoxic medications when possible. -Strict INO's  Actinic keratosis Patient has a small actinic keratosis on his upper left back roughly 1/2 cm in size.  We will perform shave biopsy today at bedside. - Shave biopsy today  #Liver mass: Renal ultrasound was significant for a large (2.8  cm) renal mass that was unable to be differentiated on renal ultrasound.  Will perform follow-up CT scan with contrast today. -CT scan with contrast of the abdomen.  #T2DM Patient was started on Lantus 30 units and resistant sliding scale.  He will likely need additional insulin as he is likely insulin resistant at this time. - Continue Lantus 30 units.  - Continue SSI resistant  #Normocytic Anemia:  Patient continues have a normocytic anemia with low iron and ferritin levels and elevated saturation.  Smear was within normal limits.  Reticulocyte count showed hypoproliferative bone marrow.  Hgb of 9.8 MCV of 96 with unknown baseline -  Maintain transfusion goals of less than 7.  Thrombocytopenia: Patient continues to have a thrombocytopenia with platelets in the 70s.  He has no signs or symptoms of active bleeding at this time although he does have an FOBT that is positive. -Transfusion goal of less than 50 with symptoms of bleeding and less than 20 otherwise. -HIV pending  #CAD: #HLF: #HTN: -Continue homeatorvastatin 40 mgmedication. - Continue Amlodipine 10 mg - Continue Metoprolol 40 mg  - Hold LSN-HCTZ 20-12.5 mg     Diet: Carb-Modified IVF: None,None VTE: Enoxaparin Code: Full PT/OT recs: Pending, none. TOC recs: pending    Dispo: Anticipated discharge to TBD\ pending clinical improvement.    Marianna Payment, D.O. [facility], PGY-2 Date 06/02/2020 Time 2:26 PM Pager: [phone] Please contact the on call pager after 5 pm and on weekends at [phone].

## 2020-06-02 NOTE — Progress Notes (Signed)
Spoke with internal medicine resident to notify about CBG of 456. She will talk with the team and I will look for new orders on how to proceed.

## 2020-06-02 NOTE — Progress Notes (Signed)
  Date: 06/02/2020  Patient name: Izekiel Flegel  Medical record number: [id number]  Date of birth: 08/01/1956   I have seen and evaluated Nyra Jabs and discussed their care with the Residency Team. Briefly, Mr. Rieman came in with a community acquired pneumonia and LE swelling likely related to venous insufficiency.  He also had left knee pain without injury which was worse today.  He had a MF pneumonia on CXR and COVID was negative.  He was started on antibiotics.  PCT was low.   PMHx, Fam Hx, and/or Soc Hx : He is a Administrator.  Former tobacco user, cigarettes.  He has a FH of CAD, DM, COPD  Vitals:   06/02/20 1135 06/02/20 1616  BP: (!) 116/56 126/67  Pulse: 68 70  Resp: 20 20  Temp: 97.9 F (36.6 C) 98.2 F (36.8 C)  SpO2: 91% 97%   General: Awake, alert, no acute distress Eyes: anicteric sclerae, mild conjunctival injectoin HENT: Neck is supple, MMM CV: RR, NR, no murmur, peripheral edema to the mid thigh, pitting in lower legs to 2+, pulses intact in DP and radial bilaterally Pulm: Effort is normal, not requiring oxygen, Rales bilaterally, no crackles, no wheezing Abd: Obese, NT, ND, +BS MSK: He has knee pain on the left knee with movement of the patella.  He has normal ROM of the left knee.  Skin: He has changes of venous insufficiency in the legs, he has a red patch on the upper lateral border of the left knee.  He has multiple AKs on the arms and one large one on the posterior left shoulder.  Psych: Mood normal, pleasant  Assessment and Plan: I have seen and evaluated the patient as outlined above. I agree with the formulated Assessment and Plan as detailed in the residents' note, with the following changes:   1. CAP, multifocal - PCT low - possible viral pneumonia unrelated to COVID - Monitor for oxygen nees - Continue antibiotics - BC X 2  2. HFpEF, LE swelling - Strict I/O - Would consider getting a LE doppler on the left  3. Left knee pain - Attempted  arthrocentesis today - dry tap - IR arthocentesis tomorrow  4. SK on back - Removed by shave biopsy, follow up pathology  Other issues per Dr. Sammie Bench daily note.   Sid Falcon, MD 9/6/20215:36 PM

## 2020-06-02 NOTE — Procedures (Addendum)
Shave Biopsy Procedure Note  Pre-operative Diagnosis: Actinic Keratosis   Locations:left posterior shoulder  Anesthesia: Lidocaine 1% without epinephrine  Procedure Details  History of allergy to lidocaine: no Sensitivity to epinephrine: no  Patient informed of the risks (including bleeding and infection) and benefits of the  procedure and verbal informed consent obtained.  The lesion and surrounding area was prepped with an alcohol swab. A scalpel or Dermablade was used to shave an area of skin.  Hemostasis achieved with direct pressure. Vaseline and a sterile dressing applied.  The specimen was sent for pathologic examination. The patient tolerated the procedure well.  Condition: Stable  Complications: none.  Plan: 1. Instructed to keep the wound dry and covered for 24-48h and clean thereafter. 2. Patient instructed to apply Vaseline daily until healed. 3. Warning signs of infection were reviewed.

## 2020-06-02 NOTE — Hospital Course (Addendum)
Atypical pneumonia  Lower extremity edema  Gout  Diabetes mellitus  Liver cirrhosis with new liver lesions

## 2020-06-02 NOTE — Progress Notes (Addendum)
Subjective:  Feeling a little worse. L knee is more swollen and tender. Now red.  Patient reports that he fell but a nodule and swelling popped up over the past three days. Patient reports that his L. Knee is stiff and tender today worse than yesterday. CBG is still high making patient uncomfortable. Denies fever, CP, headache, abdominal pain, and vomiting.   Objective:  Vital signs in last 24 hours: Vitals:   06/02/20 2305 06/03/20 0008 06/03/20 0326 06/03/20 1139  BP:  131/61 119/68 (!) 112/54  Pulse:  79 78 61  Resp:  18 18 18   Temp:  98.4 F (36.9 C) 98.6 F (37 C) 98 F (36.7 C)  TempSrc:  Oral Oral   SpO2: 98% 99% 98% 94%  Weight:   123.9 kg   Height:       Physical Exam Constitutional:      Appearance: Normal appearance.  Cardiovascular:     Rate and Rhythm: Normal rate and regular rhythm.  Musculoskeletal:     Comments: Left knee is erythematous and swollen with effusion present under knee with warmth touch or LLE and diffuse redness.  Skin:    Comments: Scattered actinic keratosis on arms, 1cm scaly area with underlying hypopigmentation on left shoulder  Neurological:     Mental Status: He is alert.    Assessment/Plan:  Principal Problem:   CAP (community acquired pneumonia) Active Problems:   Pancytopenia ([place])   Hypertension   Diabetes ([place])   Stage 3 chronic kidney disease   Effusion of lower leg joint   AKI (acute kidney injury) ([place])  Nicholas Rice is a 64 y.o. with pertinent PMH of CKD stage 3, HTN, T2DM, Anemia, GERD, BPH, HLD, CAD (previously told he has angina) HFpEF (EF 60%) who presented with a 1 month history of shortness of breath progressively got worse over the last week and admit for community-acquired pneumonia complicated by acute kidney injury on hospital day 0  #CAP Patient presents with 1 month history of shortness of breath with exertion that progressively got worse over the last week.  He does admit to fevers, chills, and cough  during this time.  Patient is hemodynamically stable without leukocytosis or lactic acidosis.  However, he does have imaging consistent with multilobar pneumonia. -Will continue azithromycin and ceftriaxone on day 3 of antibiotics will continue for 5 days  -Procalcitonin negative -Blood cultures no growth 2 days -Continue CAP coverage with azithromycin and ceftriaxone  #HFpEF (EF of 60 - 65%) Patient has a history of heart failure with preserved ejection fraction.  Recently had an echo showing an EF of 60% without regional wall motion abnormalities or valvular dysfunction.  Recent stress test did not show any evidence of ischemic cardiomyopathy.  Patient does currently take Lasix for lower extremity edema which is likely secondary to venous insufficiency.  #Possible AKI vs CKD Cr of 1.3 and GFR of 49 without a baseline for comparison. There is some note of possible CKD stage III in his chart.  - Will hold ACEI  - Avoid nephrotoxic medications  - Renal US pending - Strict I&O  #Left Knee Pain  Patient with left knee pain and swelling concerning for gout vs prepatellar bursitis sp dry tap yesterday. - Volatern gel TID for pain - Consider joint injection tomorrow  #T2DM Glucose of 274 - Lantus 40 unit BID, Aspart 10 units TID with meals - SSI resistant - Hgb A1c 9.1  #Hyperkalemia: Patient presents with an elevated creatinine and a potassium  of 5.4.  Patient does take oral potassium at home. Now 4.7 today.  #Normocytic Anemia:  Hgb of 9.8 MCV of 96 with unknown baseline - Iron, Ferritin, TIBC,  Reticulocyte, Smear pending  -Transfusion goal less than 7.  Thrombocytopenia: Patient has a platelet count of 78.  No obvious reason for thrombocytopenia. -Screen for HIV -We will perform reticulocyte count and smear -We will recheck CBC to make sure platelet count is correct. -Transfusion goals less than 50 with concern for bleeding and less than 20 otherwise.  #CAD: #HLF:     #HTN: -Continue home atorvastatin 40 mg medication. - Continue Amlodipine 10 mg - Continue Metoprolol 40 mg  - Hold LSN-HCTZ 20-12.5 mg   #Depression: - Continue home SSRI   #BPH  - Continue home Flomax   Diet: Carb-Modified IVF: None,None VTE: Enoxaparin Code: Full PT/OT recs: Pending, none. TOC recs: pending  Dispo: Anticipated discharge in approximately 3 day(s).   Iona Halberstadt, MD 06/03/2020, 7:15 PM Pager: [phone] After 5pm on weekdays and 1pm on weekends: On Call pager [phone]

## 2020-06-02 NOTE — Progress Notes (Signed)
CT phoned this RN approximately 22:20 re: documented patient allergy to contrast in the physician's CT abdomen order. While CT on phone, this RN asked patient at bedside if he was allergic to contrast. Patient denied the above allergy and stated Loratadine as his only allergy.  Will cont to monitor.

## 2020-06-02 NOTE — Progress Notes (Signed)
VASCULAR LAB    Left lower extremity venous duplex has been performed.  See CV proc for preliminary results.   Kenyen Candy, RVT 06/02/2020, 10:51 AM

## 2020-06-03 ENCOUNTER — Inpatient Hospital Stay (HOSPITAL_COMMUNITY): Payer: Medicare HMO

## 2020-06-03 LAB — GLUCOSE, CAPILLARY
Glucose-Capillary: 166 mg/dL — ABNORMAL HIGH (ref 70–99)
Glucose-Capillary: 288 mg/dL — ABNORMAL HIGH (ref 70–99)
Glucose-Capillary: 300 mg/dL — ABNORMAL HIGH (ref 70–99)
Glucose-Capillary: 324 mg/dL — ABNORMAL HIGH (ref 70–99)
Glucose-Capillary: 353 mg/dL — ABNORMAL HIGH (ref 70–99)
Glucose-Capillary: 405 mg/dL — ABNORMAL HIGH (ref 70–99)
Glucose-Capillary: 408 mg/dL — ABNORMAL HIGH (ref 70–99)
Glucose-Capillary: 478 mg/dL — ABNORMAL HIGH (ref 70–99)

## 2020-06-03 LAB — CBC
HCT: 29.4 % — ABNORMAL LOW (ref 39.0–52.0)
Hemoglobin: 9.5 g/dL — ABNORMAL LOW (ref 13.0–17.0)
MCH: 30.9 pg (ref 26.0–34.0)
MCHC: 32.3 g/dL (ref 30.0–36.0)
MCV: 95.8 fL (ref 80.0–100.0)
Platelets: 88 10*3/uL — ABNORMAL LOW (ref 150–400)
RBC: 3.07 MIL/uL — ABNORMAL LOW (ref 4.22–5.81)
RDW: 15.2 % (ref 11.5–15.5)
WBC: 5.6 10*3/uL (ref 4.0–10.5)
nRBC: 0 % (ref 0.0–0.2)

## 2020-06-03 LAB — BASIC METABOLIC PANEL
Anion gap: 8 (ref 5–15)
BUN: 36 mg/dL — ABNORMAL HIGH (ref 8–23)
CO2: 22 mmol/L (ref 22–32)
Calcium: 8.9 mg/dL (ref 8.9–10.3)
Chloride: 102 mmol/L (ref 98–111)
Creatinine, Ser: 1.3 mg/dL — ABNORMAL HIGH (ref 0.61–1.24)
GFR calc Af Amer: >60 mL/min (ref >60)
GFR calc non Af Amer: 57 mL/min — ABNORMAL LOW (ref >60)
Glucose, Bld: 274 mg/dL — ABNORMAL HIGH (ref 70–99)
Potassium: 4.7 mmol/L (ref 3.5–5.1)
Sodium: 132 mmol/L — ABNORMAL LOW (ref 135–145)

## 2020-06-03 IMAGING — CT CT ABDOMEN W/ CM
2 of 10 series · 10 of 46 positions shown, 15 images · IV contrast (omnipaque)
Comparison: CT the abdomen and pelvis [DATE]. CT the abdomen
and pelvis [DATE].

CLINICAL DATA: 64-year-old male with history of liver lesion noted
on recent ultrasound examination. Follow-up study.

EXAM:
CT ABDOMEN WITH CONTRAST
TECHNIQUE: Multidetector CT imaging of the abdomen was performed using the
standard protocol following bolus administration of intravenous
contrast.
CONTRAST:  100mL OMNIPAQUE IOHEXOL 300 MG/ML  SOLN

[Series 8: arterial cor · coronal · arterial · 0.52mm/px · 3 of 177 slices shown]
[im 45/177  soft-tissue]
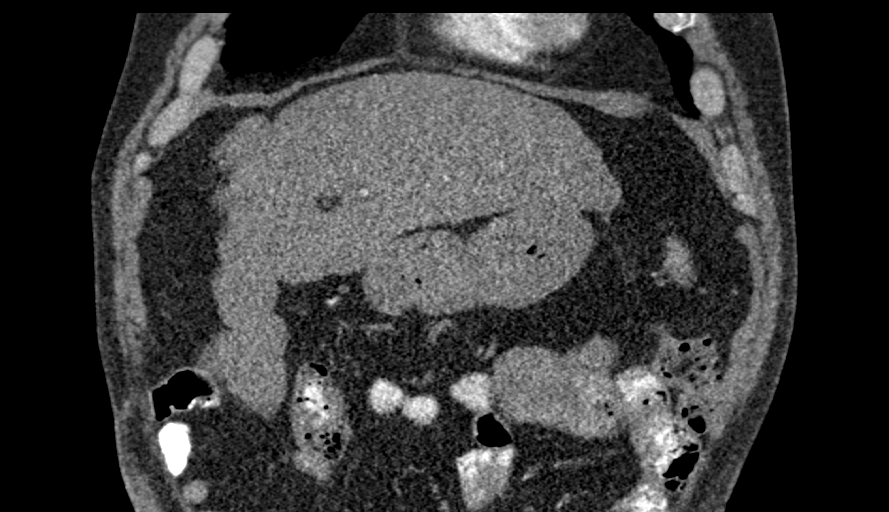
[im 89/177  soft-tissue]
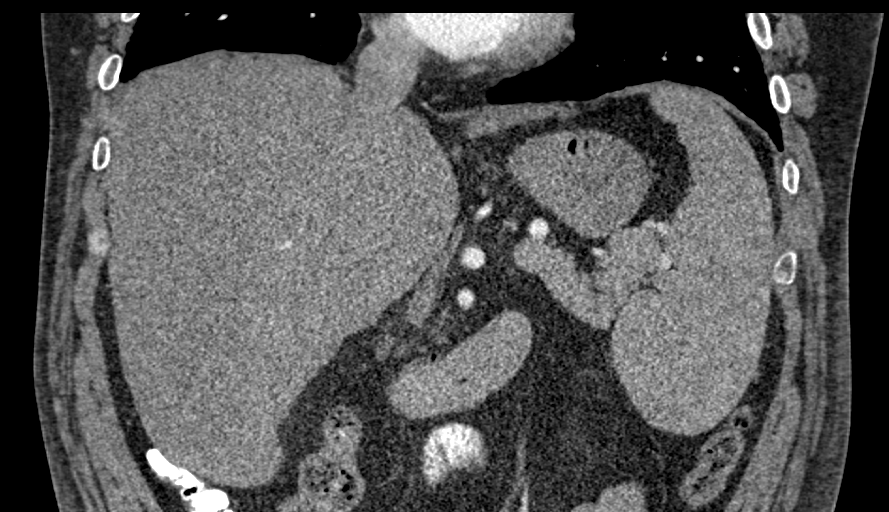
[im 133/177  soft-tissue]
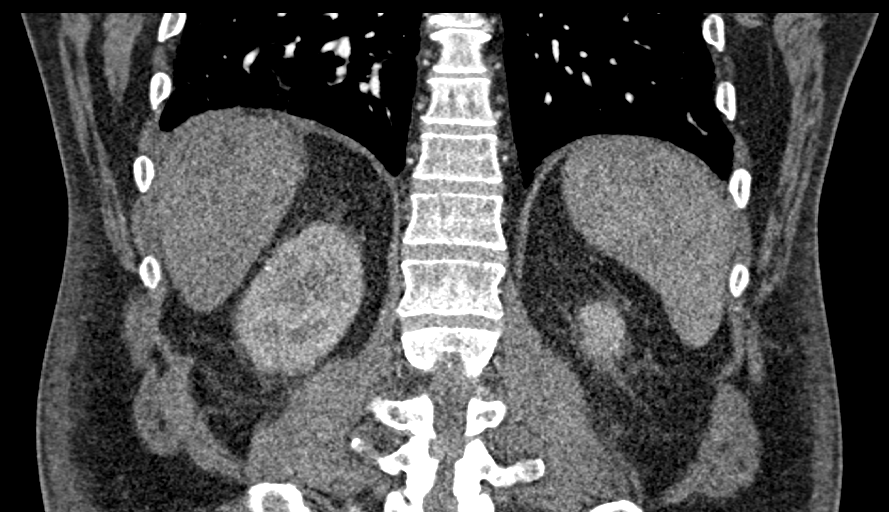

[Series 10: venous · axial · portal-venous · 0.95mm/px · z∈[+952,+1147]mm · 7 of 87 slices shown, 12 images]
[im 11/87  soft-tissue]
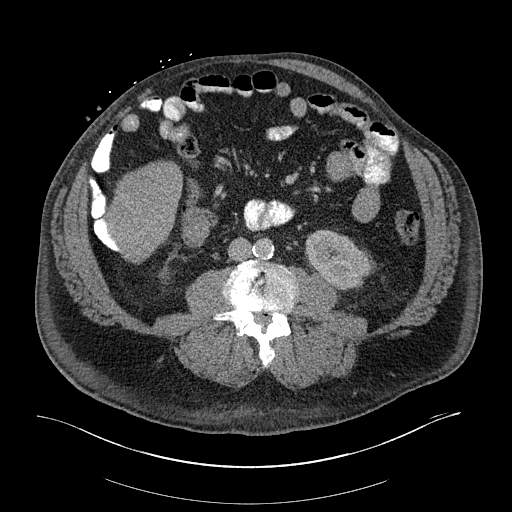
[im 11/87  bone]
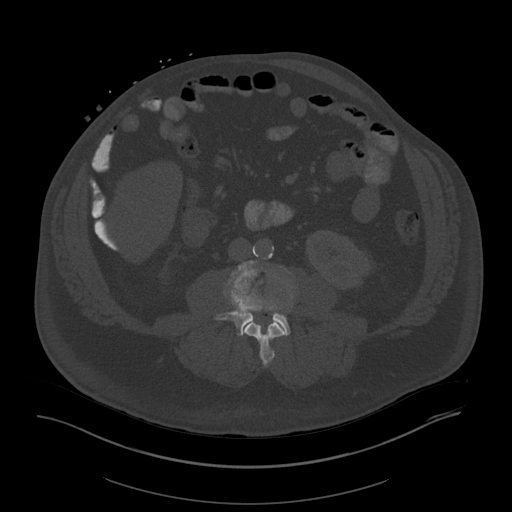
[im 22/87  soft-tissue]
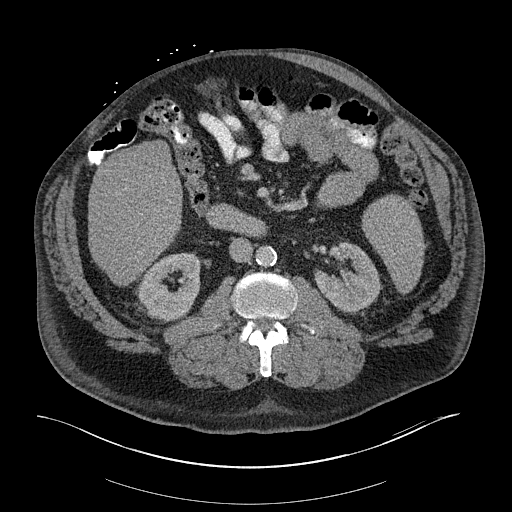
[im 33/87  soft-tissue]
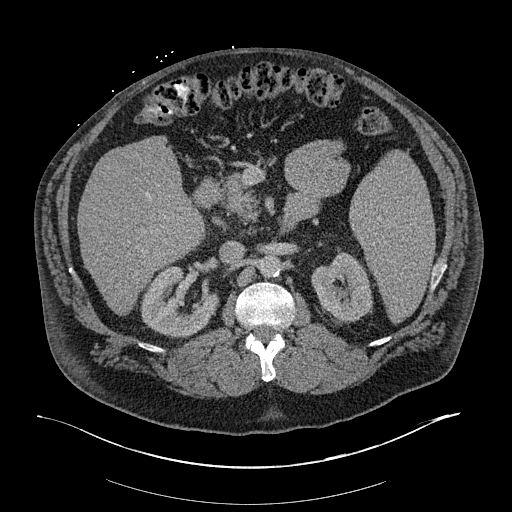
[im 44/87  soft-tissue]
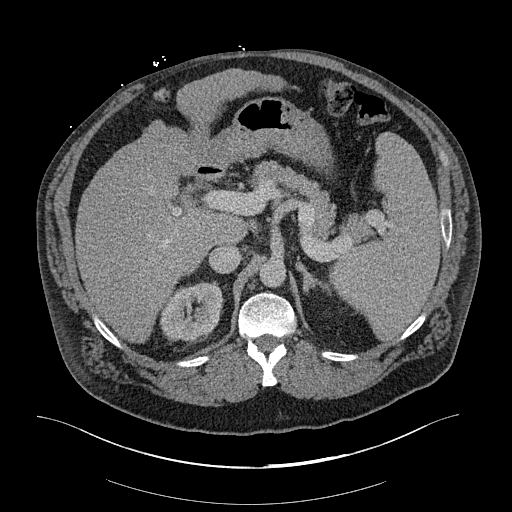
[im 44/87  lung]
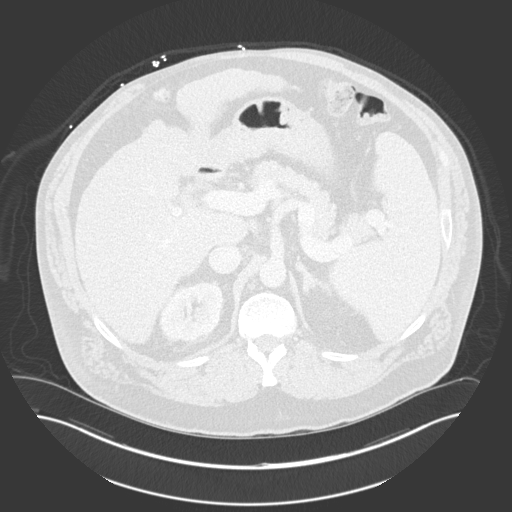
[im 54/87  soft-tissue]
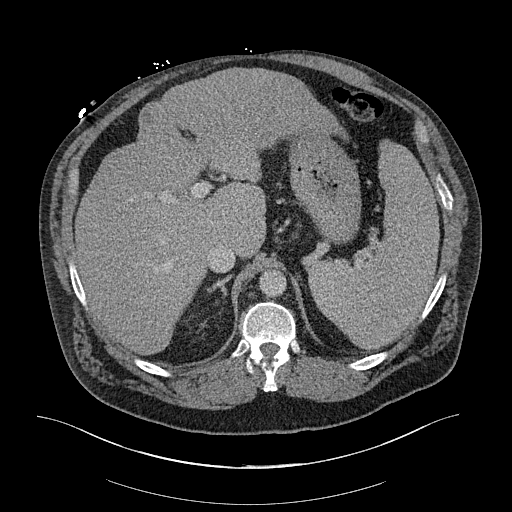
[im 54/87  lung]
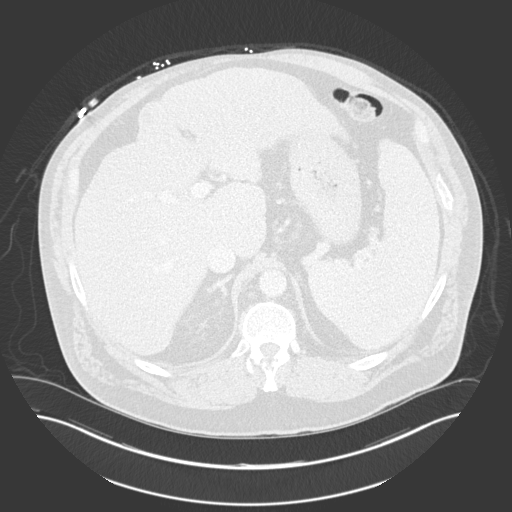
[im 65/87  soft-tissue]
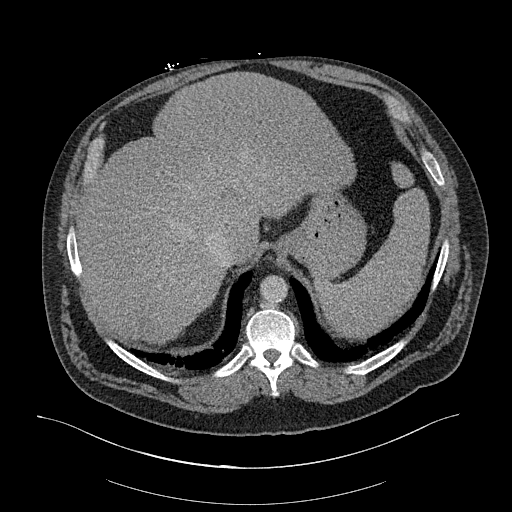
[im 65/87  lung]
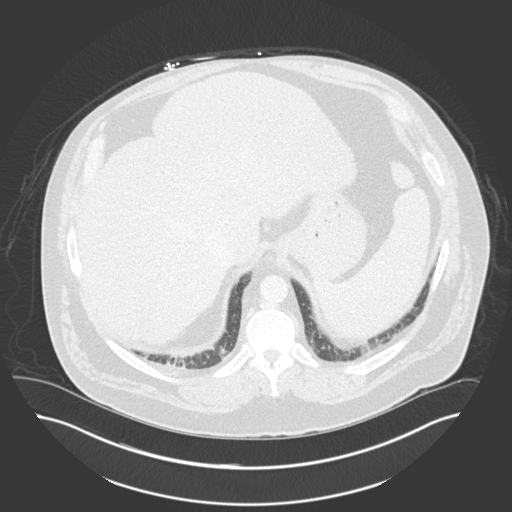
[im 76/87  soft-tissue]
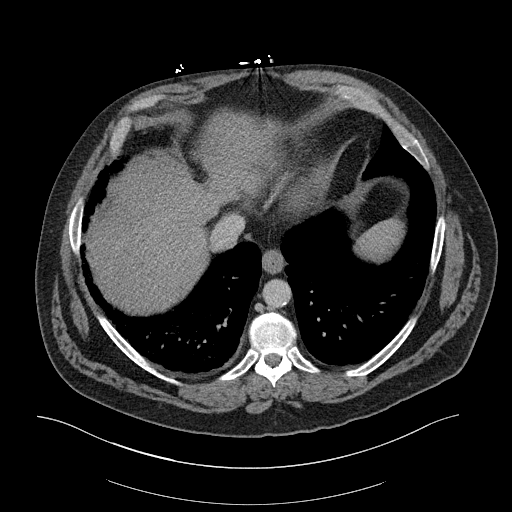
[im 76/87  lung]
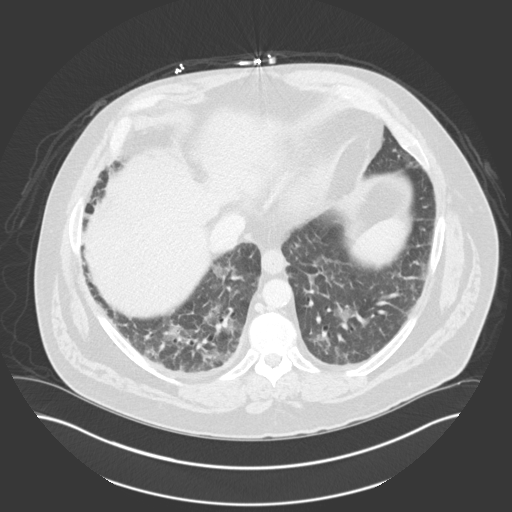

[10 of 46 positions shown; findings below may reference images not displayed]

FINDINGS: Lower chest: Patchy multifocal areas of ground-glass attenuation
noted in the lung bases bilaterally concerning for potential
multilobar pneumonia. Atherosclerotic calcifications in the
descending thoracic aorta as well as the left circumflex coronary
artery.

Hepatobiliary: Diffuse low attenuation throughout the hepatic
parenchyma, indicative of a background of hepatic steatosis. Liver
has a shrunken appearance and nodular contour, indicative of
underlying cirrhosis. There are 2 small liver lesions. One of these
is in the superficial aspect of segment 4B (axial image 34 of series
10) measuring 1.5 cm which remains low-attenuation, compatible with
a small simple cyst. The other lesion is in the posterior aspect of
segment 7 (axial image 23 of series 10) measuring 1.6 x 1.5 cm,
which is low attenuation on noncontrast images but increases to
intermediate attenuation (47 HU) on portal venous delays. No
hypervascular lesions are identified during arterial phase imaging.
No intra or extrahepatic biliary ductal dilatation. Status post
cholecystectomy.

Pancreas: No pancreatic mass. No pancreatic ductal dilatation. No
pancreatic or peripancreatic fluid collections or inflammatory
changes.

Spleen: Spleen is enlarged measuring 20.3 x 11.3 x 18.3 cm
(estimated splenic volume of 2,099 mL) .

Adrenals/Urinary Tract: Bilateral kidneys and adrenal glands are
normal in appearance. No hydroureteronephrosis in the visualized
portions of the abdomen.

Stomach/Bowel: Normal appearance of the stomach. No pathologic
dilatation of visualized portions of small bowel or colon.

Vascular/Lymphatic: Aortic atherosclerosis, without evidence of
aneurysm or dissection in the abdominal vasculature. Portal vein is
mildly dilated measuring 18 mm in the porta hepatis. No
lymphadenopathy noted in the abdomen.

Other: No significant volume of ascites and no pneumoperitoneum
noted in the visualized portions of the peritoneal cavity.

Musculoskeletal: There are no aggressive appearing lytic or blastic
lesions noted in the visualized portions of the skeleton.
IMPRESSION: 1. Hepatic cirrhosis with hepatic steatosis with 2 small liver
lesions, 1 of which is compatible with a small simple cyst, while
the other lesion in segment 7 is indeterminate on today's
examination. This lesion may represent a cavernous hemangioma,
however, continued attention on follow-up studies is recommended.
This is not a hypervascular lesion and is therefore unlikely to
represent hepatocellular carcinoma. However, given the patient's
cirrhosis, future follow-up examination with abdominal MRI with and
without IV gadolinium is recommended in 3-6 months to ensure the
stability of this finding and to better monitor for potential HCC.
2. Evidence of portal hypertension as demonstrated by mild
dilatation of the portal vein and the presence of splenomegaly.

## 2020-06-03 MED ORDER — IOHEXOL 300 MG/ML  SOLN
100.0000 mL | Freq: Once | INTRAMUSCULAR | Status: AC | PRN
  Administered 2020-06-03: 100 mL via INTRAVENOUS

## 2020-06-03 MED ORDER — ACETAMINOPHEN 500 MG PO TABS
1000.0000 mg | ORAL_TABLET | Freq: Three times a day (TID) | ORAL | Status: DC
  Administered 2020-06-03 – 2020-06-04 (×2): 1000 mg via ORAL
  Filled 2020-06-03 (×2): qty 2

## 2020-06-03 MED ORDER — ACETAMINOPHEN 650 MG RE SUPP: 650.0000 mg | Freq: Three times a day (TID) | RECTAL | Status: DC

## 2020-06-03 MED ORDER — INSULIN ASPART 100 UNIT/ML ~~LOC~~ SOLN
0.0000 [IU] | Freq: Three times a day (TID) | SUBCUTANEOUS | Status: DC
  Administered 2020-06-03: 11 [IU] via SUBCUTANEOUS
  Administered 2020-06-04: 7 [IU] via SUBCUTANEOUS
  Administered 2020-06-04 – 2020-06-05 (×3): 20 [IU] via SUBCUTANEOUS

## 2020-06-03 MED ORDER — INSULIN ASPART 100 UNIT/ML ~~LOC~~ SOLN
10.0000 [IU] | Freq: Three times a day (TID) | SUBCUTANEOUS | Status: DC
  Administered 2020-06-03 – 2020-06-05 (×5): 10 [IU] via SUBCUTANEOUS

## 2020-06-03 MED ORDER — INSULIN GLARGINE 100 UNIT/ML ~~LOC~~ SOLN
40.0000 [IU] | Freq: Two times a day (BID) | SUBCUTANEOUS | Status: DC
  Administered 2020-06-03 – 2020-06-04 (×2): 40 [IU] via SUBCUTANEOUS
  Filled 2020-06-03 (×3): qty 0.4

## 2020-06-03 MED ORDER — INSULIN GLARGINE 100 UNIT/ML ~~LOC~~ SOLN
30.0000 [IU] | Freq: Once | SUBCUTANEOUS | Status: AC
  Administered 2020-06-03: 30 [IU] via SUBCUTANEOUS
  Filled 2020-06-03: qty 0.3

## 2020-06-03 MED ORDER — DICLOFENAC SODIUM 1 % EX GEL
2.0000 g | Freq: Four times a day (QID) | CUTANEOUS | Status: DC
  Administered 2020-06-03 – 2020-06-10 (×25): 2 g via TOPICAL
  Filled 2020-06-03 (×2): qty 100

## 2020-06-03 NOTE — Progress Notes (Addendum)
Inpatient Diabetes Program Recommendations  AACE/ADA: New Consensus Statement on Inpatient Glycemic Control   Target Ranges:  Prepandial:   less than 140 mg/dL      Peak postprandial:   less than 180 mg/dL (1-2 hours)      Critically ill patients:  140 - 180 mg/dL  Results for Nicholas Rice, Nicholas Rice (MRN [id number]) as of 06/03/2020 12:15  Ref. Range 06/03/2020 00:11 06/03/2020 00:17 06/03/2020 03:23 06/03/2020 10:12 06/03/2020 11:40  Glucose-Capillary Latest Ref Range: 70 - 99 mg/dL 405 (H) 288 (H)  Novolog 11 units 166 (H)  Novolog 4 units 478 (H)  Novolog 20 units  Lantus 30 units 353 (H)  Novolog 15 units  Results for Nicholas Rice, Nicholas Rice (MRN [id number]) as of 06/03/2020 12:15  Ref. Range 06/02/2020 06:16 06/02/2020 09:00 06/02/2020 11:36 06/02/2020 13:12 06/02/2020 15:02 06/02/2020 16:17 06/02/2020 20:19 06/02/2020 20:42  Glucose-Capillary Latest Ref Range: 70 - 99 mg/dL 307 (H)  Novolog 11 units      Lantus 10 uints 456 (H)  Novolog 18 units 452 (H)    Novolog 5 units 391 (H) 408 (H) 371 (H)  Novolog 20 units  Lantus 40 units   Results for Nicholas Rice, Nicholas Rice (MRN [id number]) as of 06/03/2020 12:15  Ref. Range 06/01/2020 19:55  Hemoglobin A1C Latest Ref Range: 4.8 - 5.6 % 9.1 (H)   Review of Glycemic Control  Diabetes history: DM2 Outpatient Diabetes medications: Glipizide XL 10 mg BID, NPH 140 units TID (in am, at noon, at bedtime), Metformin 85- mg TID, Toujeo 50 unit QHS Current orders for Inpatient glycemic control: Lantus 40 units QHS, Novolog 0-20 units Q4H  Inpatient Diabetes Program Recommendations:    Insulin: Please consider increasing Lantus to 40 units BID and ordering Novolog 10 units TID with meals for meal coverage if patient eats at least 50% of meals.  HbgA1C:A1C 9.1% on 06/01/20 indicating an average glucose of 214 mg/dl over the past 2-3 months.  NOTE: Diabetes Coordinator working remotely today. Attempted to call patient to confirm outpatient DM medication regimen but no answer. Will try  again later today.  Thanks, Barnie Alderman, RN, MSN, CDE Diabetes Coordinator Inpatient Diabetes Program [phone] (Team Pager from 8am to 5pm)

## 2020-06-04 ENCOUNTER — Inpatient Hospital Stay (HOSPITAL_COMMUNITY): Payer: Medicare HMO

## 2020-06-04 LAB — GLUCOSE, CAPILLARY
Glucose-Capillary: 325 mg/dL — ABNORMAL HIGH (ref 70–99)
Glucose-Capillary: 488 mg/dL — ABNORMAL HIGH (ref 70–99)
Glucose-Capillary: 400 mg/dL — ABNORMAL HIGH (ref 70–99)
Glucose-Capillary: 222 mg/dL — ABNORMAL HIGH (ref 70–99)
Glucose-Capillary: 318 mg/dL — ABNORMAL HIGH (ref 70–99)

## 2020-06-04 LAB — HEPATITIS PANEL, ACUTE
HCV Ab: NONREACTIVE
Hep A IgM: NONREACTIVE
Hep B C IgM: NONREACTIVE
Hepatitis B Surface Ag: NONREACTIVE

## 2020-06-04 LAB — BASIC METABOLIC PANEL
Anion gap: 10 (ref 5–15)
BUN: 40 mg/dL — ABNORMAL HIGH (ref 8–23)
CO2: 21 mmol/L — ABNORMAL LOW (ref 22–32)
Calcium: 8.6 mg/dL — ABNORMAL LOW (ref 8.9–10.3)
Chloride: 97 mmol/L — ABNORMAL LOW (ref 98–111)
Creatinine, Ser: 1.52 mg/dL — ABNORMAL HIGH (ref 0.61–1.24)
GFR calc Af Amer: 55 mL/min — ABNORMAL LOW (ref >60)
GFR calc non Af Amer: 47 mL/min — ABNORMAL LOW (ref >60)
Glucose, Bld: 467 mg/dL — ABNORMAL HIGH (ref 70–99)
Potassium: 5.3 mmol/L — ABNORMAL HIGH (ref 3.5–5.1)
Sodium: 128 mmol/L — ABNORMAL LOW (ref 135–145)

## 2020-06-04 LAB — CBC
HCT: 26.2 % — ABNORMAL LOW (ref 39.0–52.0)
Hemoglobin: 8.5 g/dL — ABNORMAL LOW (ref 13.0–17.0)
MCH: 30.8 pg (ref 26.0–34.0)
MCHC: 32.4 g/dL (ref 30.0–36.0)
MCV: 94.9 fL (ref 80.0–100.0)
Platelets: 86 10*3/uL — ABNORMAL LOW (ref 150–400)
RBC: 2.76 MIL/uL — ABNORMAL LOW (ref 4.22–5.81)
RDW: 15.1 % (ref 11.5–15.5)
WBC: 5.4 10*3/uL (ref 4.0–10.5)
nRBC: 0 % (ref 0.0–0.2)

## 2020-06-04 LAB — PATHOLOGIST SMEAR REVIEW

## 2020-06-04 LAB — IRON AND TIBC
Iron: 17 ug/dL — ABNORMAL LOW (ref 45–182)
Saturation Ratios: 6 % — ABNORMAL LOW (ref 17.9–39.5)
TIBC: 277 ug/dL (ref 250–450)
UIBC: 260 ug/dL

## 2020-06-04 LAB — SURGICAL PATHOLOGY

## 2020-06-04 IMAGING — MR MR ABDOMEN WO/W CM
21 series · 46 of 48 positions shown · IV contrast (gadavist)
Comparison: CT evaluation [DATE]

CLINICAL DATA: Liver lesion discovered on ultrasound evaluation,
indeterminate on CT assessment.

EXAM:
MRI ABDOMEN WITHOUT AND WITH CONTRAST
TECHNIQUE: Multiplanar multisequence MR imaging of the abdomen was performed
both before and after the administration of intravenous contrast.
CONTRAST:  10mL GADAVIST GADOBUTROL 1 MMOL/ML IV SOLN

[Series 5: ax haste · axial · 6.0mm · 1.25mm/px · 1 of 42 slices shown]
[im 1/42]
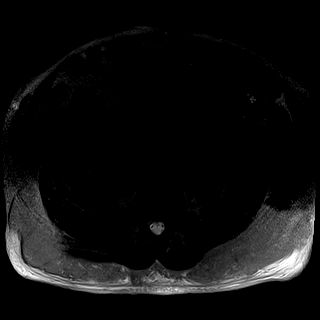

[Series 6: cor haste · coronal · 6.0mm · 1.25mm/px · 1 of 41 slices shown]
[im 1/41]
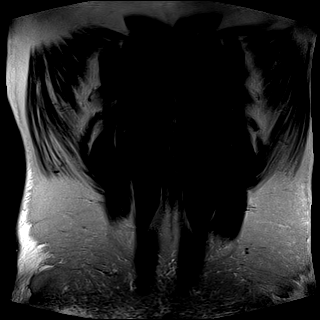

[Series 9: T2 fat-sat · axial · 6.0mm · 1.25mm/px · 1 of 42 slices shown]
[im 1/42]
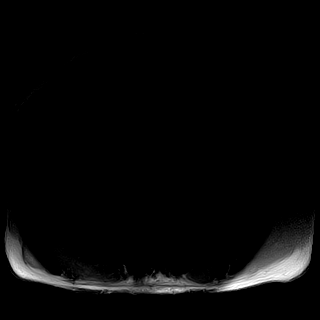

[Series 10: bSSFP · axial · 6.0mm · 0.78mm/px · 1 of 38 slices shown]
[im 1/38]
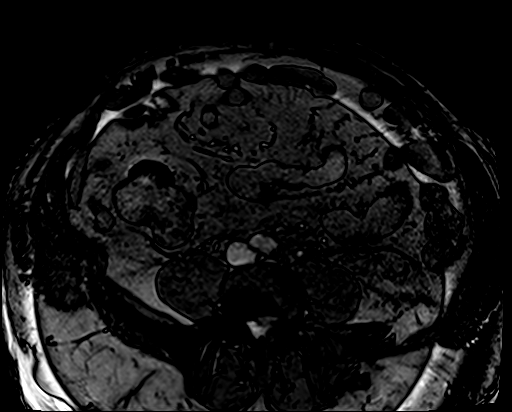

[Series 11: in phase (optional) · axial · 6.0mm · 0.78mm/px · 1 of 38 slices shown]
[im 1/38]
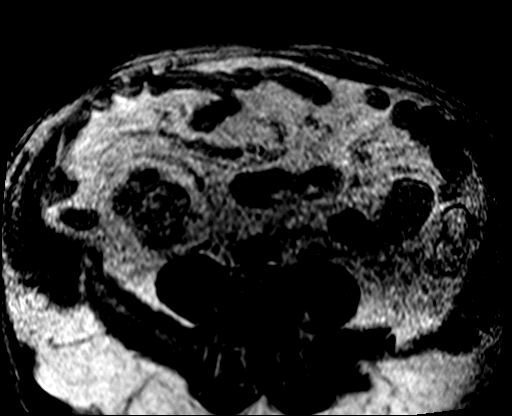

[Series 12: DWI · axial · 6.0mm · 1.49mm/px · z∈[-266,+30]mm · 3 of 126 slices shown (1 of 2)]
[im 1/126]
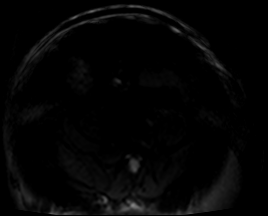
[im 63/126]
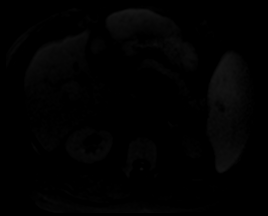
[im 126/126]
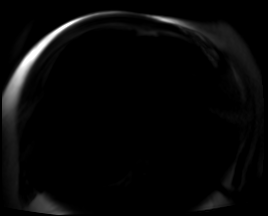

[Series 13: DWI · axial · 6.0mm · 1.49mm/px · 1 of 42 slices shown (2 of 2)]
[im 1/42]
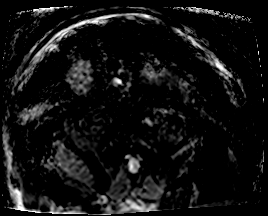

[Series 14: T1 · axial · 6.0mm · 0.78mm/px · 1 of 38 slices shown]
[im 1/38]
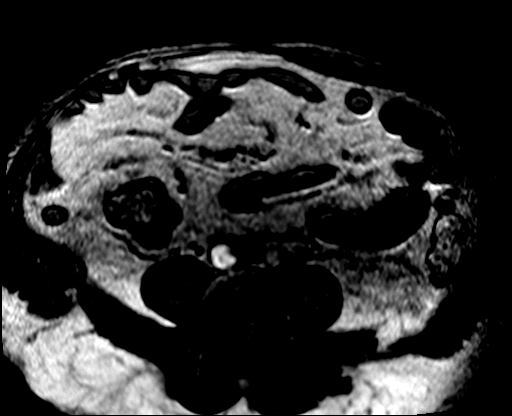

[Series 15: t1_vibe_opp-in_tra_p4_bh · axial · 3.0mm · 1.25mm/px · z∈[-234,+27]mm · 2 of 88 slices shown (1 of 2)]
[im 1/88]
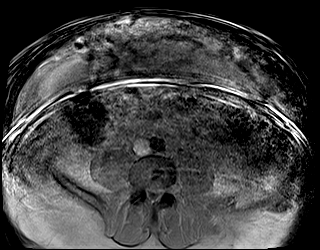
[im 88/88]
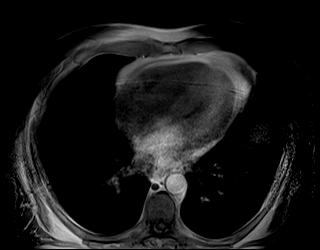

[Series 15: t1_vibe_opp-in_tra_p4_bh · axial · 3.0mm · 1.25mm/px · z∈[-234,+27]mm · 3 of 88 slices shown (2 of 2)]
[im 1/88]
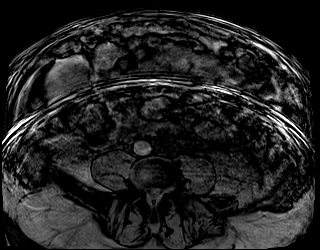
[im 44/88]
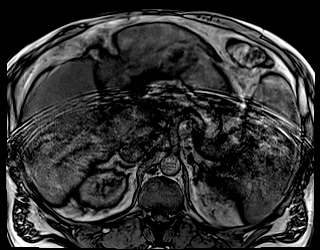
[im 88/88]
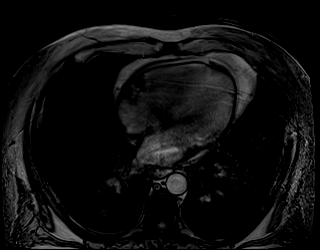

[Series 16: t1_vibe_fs_tra_p4_bh_pre · axial · 3.0mm · 1.25mm/px · z∈[-234,+27]mm · 3 of 88 slices shown]
[im 1/88]
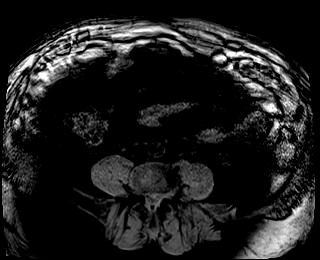
[im 44/88]
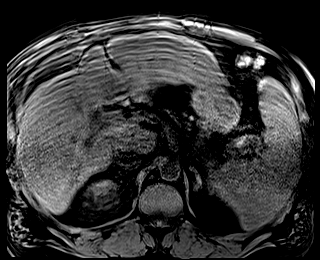
[im 88/88]
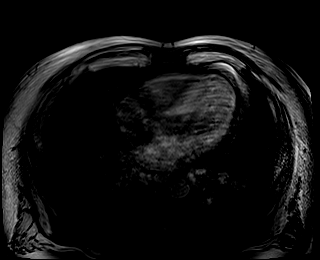

[Series 18: t1_vibe_fs_tra_p4_bh_post · axial · 3.0mm · 1.25mm/px · z∈[-234,+27]mm · 3 of 88 slices shown (1 of 4)]
[im 1/88]
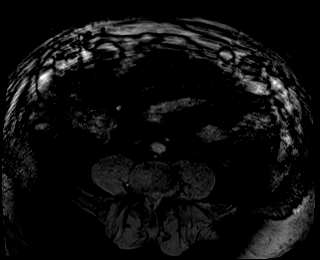
[im 44/88]
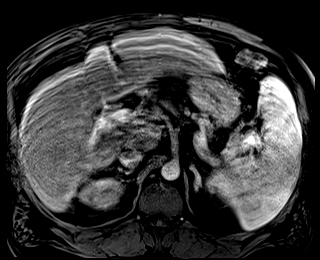
[im 88/88]
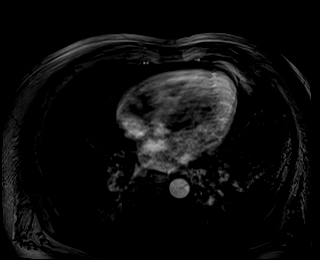

[Series 19: t1_vibe_fs_tra_p4_bh_post_sub · axial · 3.0mm · 1.25mm/px · z∈[-234,+27]mm · 3 of 88 slices shown (1 of 4)]
[im 1/88]
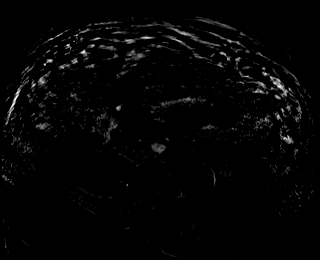
[im 44/88]
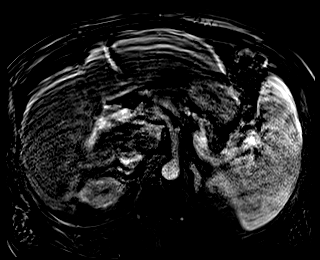
[im 88/88]
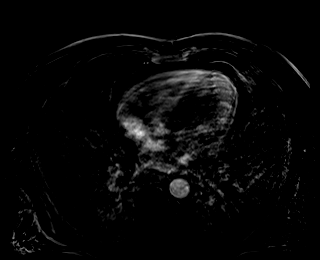

[Series 20: t1_vibe_fs_tra_p4_bh_post · axial · 3.0mm · 1.25mm/px · z∈[-234,+27]mm · 3 of 88 slices shown (2 of 4)]
[im 1/88]
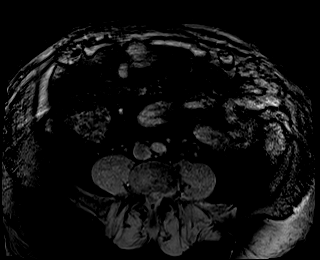
[im 44/88]
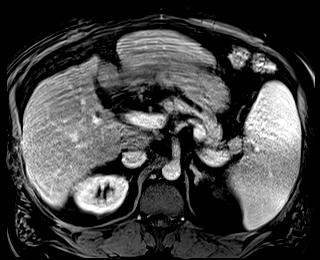
[im 88/88]
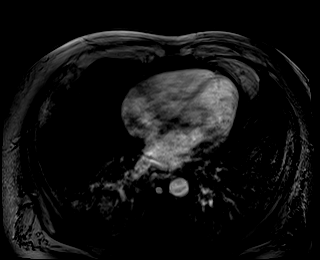

[Series 21: t1_vibe_fs_tra_p4_bh_post_sub · axial · 3.0mm · 1.25mm/px · z∈[-234,+27]mm · 3 of 88 slices shown (2 of 4)]
[im 1/88]
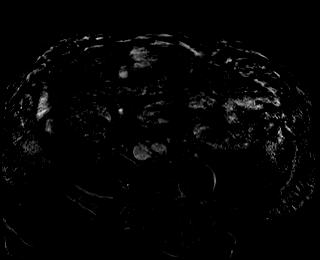
[im 44/88]
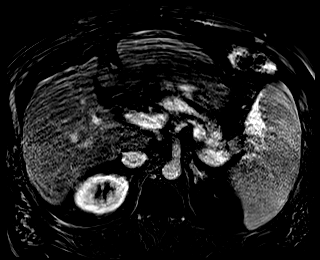
[im 88/88]
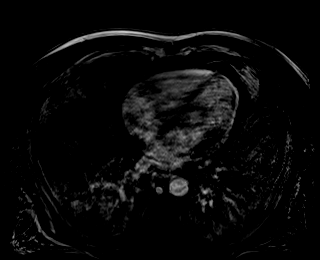

[Series 22: t1_vibe_fs_tra_p4_bh_post · axial · 3.0mm · 1.25mm/px · z∈[-234,+27]mm · 3 of 88 slices shown (3 of 4)]
[im 1/88]
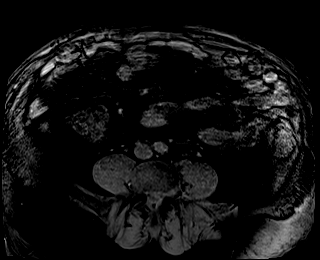
[im 44/88]
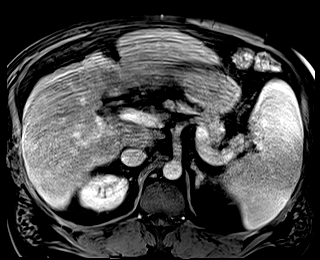
[im 88/88]
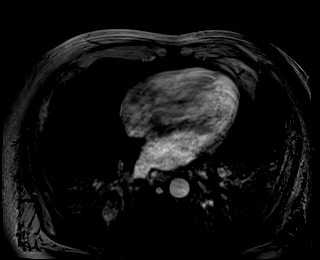

[Series 23: t1_vibe_fs_tra_p4_bh_post_sub · axial · 3.0mm · 1.25mm/px · z∈[-234,+27]mm · 3 of 88 slices shown (3 of 4)]
[im 1/88]
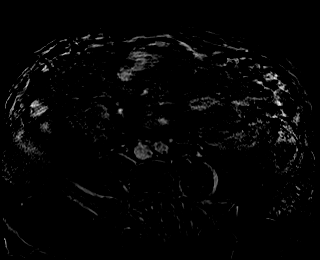
[im 44/88]
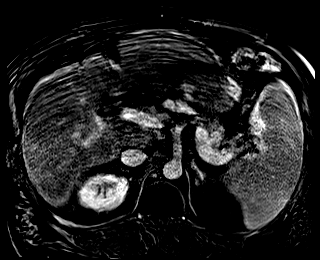
[im 88/88]
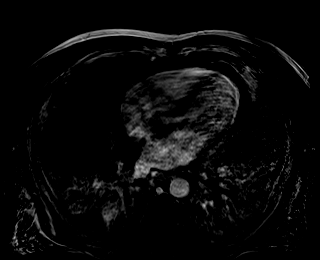

[Series 24: t1_vibe_fs_tra_p4_bh_post · axial · 3.0mm · 1.25mm/px · z∈[-234,+27]mm · 3 of 88 slices shown (4 of 4)]
[im 1/88]
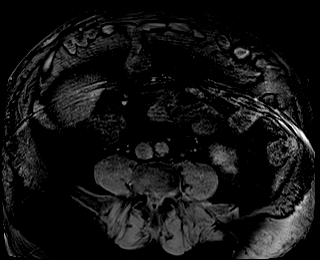
[im 44/88]
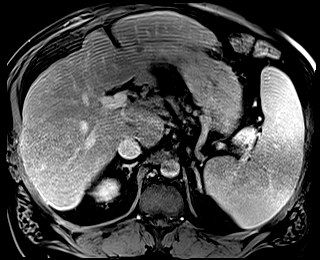
[im 88/88]
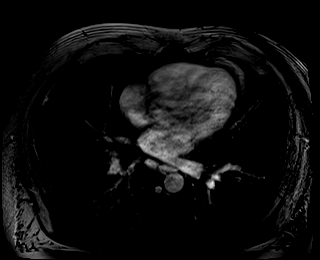

[Series 25: t1_vibe_fs_tra_p4_bh_post_sub · axial · 3.0mm · 1.25mm/px · z∈[-234,+27]mm · 3 of 88 slices shown (4 of 4)]
[im 1/88]
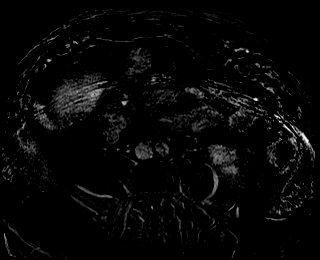
[im 44/88]
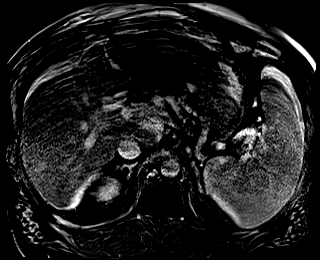
[im 88/88]
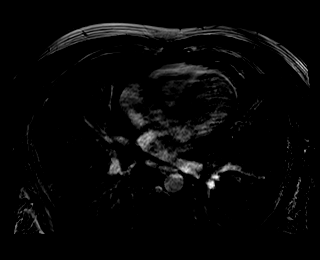

[Series 26: T1 dynamic post-contrast · coronal · 3.0mm · 1.31mm/px · 3 of 96 slices shown]
[im 1/96]
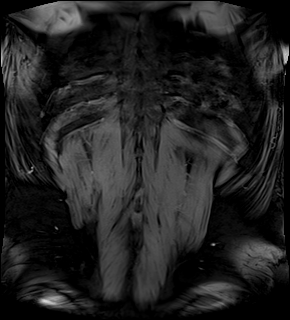
[im 48/96]
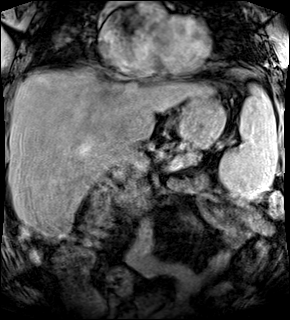
[im 96/96]
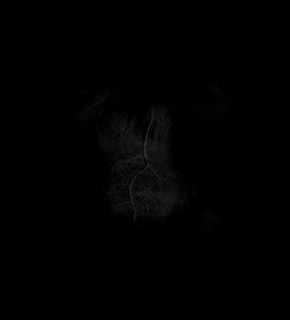

[Series 27: t1_starvibe_fs_cor_320 · coronal · 3.0mm · 1.25mm/px · 1 of 96 slices shown]
[im 1/96]
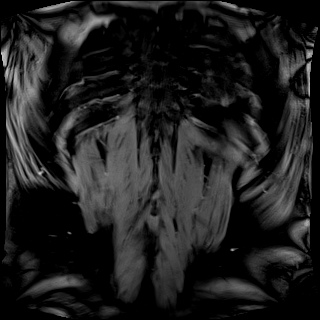

[46 of 48 positions shown; findings below may reference images not displayed]

FINDINGS: Lower chest: Patchy bilateral airspace disease better demonstrated
on recent CT assessment. No sign of pleural effusion.

Hepatobiliary: Liver displays a cirrhotic morphology and there are
signs of portal hypertension. The portal vein is patent. Hepatic
veins are patent. Exam quality is compromised by respiratory motion
in the setting of pulmonary infection. MRI relies on breath holding
for image quality. Therefore, lesion assessment is limited. On in
and out of phase T1 weighted gradient echo sequences a lesion in
hepatic subsegment VII shows signal loss on out of phase as does the
area in the anteromedial segment of the LEFT hepatic lobe. (Image 15
and image 17 of series 14) indicative of lipid containing nodules.

Assessment for enhancement is compromised by the motion described
above. No definite arterial phase enhancement is demonstrated. The
lesion in the anterior liver may be I so enhancing to surrounding
parenchyma but again motion limits assessment. Lesion in posterior
RIGHT hepatic lobe measuring approximately 15 mm. The lesion in the
anteromedial LEFT hepatic lobe measuring approximately 9 mm.

A third lesion may be present in the inferior aspect of hepatic
subsegment III (image 46, series 24) not well assessed on other
sequences due to respiratory motion.

Pancreas: Pancreas without ductal dilation, inflammation or focal
lesion.

Spleen: Spleen is enlarged as seen on recent CT evaluation
approximately 18 cm craniocaudal dimension.

Adrenals/Urinary Tract:  Adrenal glands are normal.

There is symmetric renal enhancement with smooth renal contours. No
hydronephrosis.

Stomach/Bowel: No acute gastrointestinal process to the extent
evaluated. Limited assessment on MRI, study not performed for bowel
evaluation.

Vascular/Lymphatic: Vascular structures in the abdomen are patent.
There is no gastrohepatic or hepatoduodenal ligament
lymphadenopathy. No retroperitoneal or mesenteric lymphadenopathy.

Other:  No ascites.

Musculoskeletal: No suspicious bone lesions identified.
IMPRESSION: 1. Hepatic cirrhosis with signs of portal hypertension.
2. Lesions in the liver two displaying "fatty metamorphosis" likely
dysplastic nodules, characterized based on ISAIAH criteria as LR
category 3, indeterminate. Three to six-month follow-up MRI with
multiphase evaluation is suggested. Current exam quality is
compromised by respiratory motion induced by parenchymal lung
disease.
3. Pattern of pulmonary abnormalities could be seen in the setting
of viral or atypical infection including [VH] infection.
4. Patchy bilateral interstitial and airspace disease better
demonstrated on recent CT assessment.

## 2020-06-04 MED ORDER — HYDROMORPHONE HCL 2 MG PO TABS
1.0000 mg | ORAL_TABLET | ORAL | Status: DC | PRN
  Administered 2020-06-04 – 2020-06-08 (×13): 1 mg via ORAL
  Filled 2020-06-04 (×14): qty 1

## 2020-06-04 MED ORDER — GADOBUTROL 1 MMOL/ML IV SOLN
10.0000 mL | Freq: Once | INTRAVENOUS | Status: AC | PRN
  Administered 2020-06-04: 10 mL via INTRAVENOUS

## 2020-06-04 MED ORDER — INSULIN GLARGINE 100 UNIT/ML ~~LOC~~ SOLN
80.0000 [IU] | Freq: Two times a day (BID) | SUBCUTANEOUS | Status: DC
  Administered 2020-06-04: 80 [IU] via SUBCUTANEOUS
  Filled 2020-06-04 (×2): qty 0.8

## 2020-06-04 MED ORDER — ACETAMINOPHEN 325 MG PO TABS: 650.0000 mg | ORAL_TABLET | Freq: Four times a day (QID) | ORAL | Status: DC | PRN

## 2020-06-04 MED ORDER — ACETAMINOPHEN 650 MG RE SUPP: 650.0000 mg | Freq: Four times a day (QID) | RECTAL | Status: DC | PRN

## 2020-06-04 NOTE — Progress Notes (Signed)
Subjective:  [name] states he has new pain behind her right knee that started last night. States the pain is throbbing pain that prevents him from walking. His left knee is feeling improved. Denies fever, chest pain and abdominal pain.  Objective:  Vital signs in last 24 hours: Vitals:   06/04/20 0037 06/04/20 0442 06/04/20 0900 06/04/20 1212  BP:  127/68 127/63 (!) 131/58  Pulse:  81 85 76  Resp:  18  19  Temp:  99.1 F (37.3 C) 99.3 F (37.4 C) 98.4 F (36.9 C)  TempSrc:  Oral Oral Oral  SpO2:  93% 94% 93%  Weight: 124 kg     Height:       Physical Exam Constitutional:      Appearance: Normal appearance.  Cardiovascular:     Rate and Rhythm: Normal rate and regular rhythm.  Pulmonary:     Effort: Pulmonary effort is normal.     Breath sounds: Normal breath sounds. No wheezing.  Musculoskeletal:     Comments: Pain to palpation of right posterior knee with increase pain with foot dorsiflexion no effusion or swelling, no redness. improvement in left knee nodule size and redness  Neurological:     Mental Status: He is alert.     Assessment/Plan:  Active Problems:   Pancytopenia ([facility])   Hypertension   Diabetes ([place])   Stage 3 chronic kidney disease   CAP (community acquired pneumonia)   Effusion of lower leg joint   AKI (acute kidney injury) ([place])  Mat Nicholas Rice a 64 y.o.with pertinent PMH of CKD stage 3, HTN, T2DM, Anemia, GERD, BPH, HLD, CAD (previously told he has angina)HFpEF (EF 60%)who presented with a 1 month history of shortness of breath progressively got worse over the last weekand admit for community-acquired pneumonia complicated by acute kidney injury.  #CAP Patientpresents with 1 month history of shortness of breath with exertion that progressively got worse over the last week. He does admit to fevers, chills,and cough during this time. Patient is hemodynamically stable without leukocytosis or lactic acidosis. However, he does have  imaging consistent with multilobar pneumonia. -Will continue azithromycin and ceftriaxone on day 3 of antibiotics will continue for 5 days  -Procalcitonin negative -Blood cultures no growth 2 days -Continue CAP coverage with azithromycin and ceftriaxone  #HFpEF (EF of 60 - 65%) Patient has a history of heart failure with preserved ejection fraction. Recently had an echo showing an EF of 60% without regional wall motion abnormalities or valvular dysfunction. Recent stress test did not show any evidence of ischemic cardiomyopathy. Patient does currently take Lasix for lower extremity edema which is likely secondary to venous insufficiency. - Furosemide 40 mg BID  #Possible AKI vs CKD Cr of 1.52 and GFR of 49 without a baseline for comparison.There is some note of possible CKD stage III in his chart. - Will hold ACEI  - Avoid nephrotoxic medications - Renal US pending - Strict I&O  #Knee Pain  Patient with left knee pain and swelling concerning for gout vs prepatellar bursitis sp dry tap on 06/03/2020. Now with right posterior knee pain no swelling concerning for possible DVT. US doppler of LLE negative for DVT 2 days ago. - IV dilaudid 1 mg PRN q4 for right knee pain - US doppler of right leg for DVT - Volatern gel TID for  Left pain  #T2DM Glucose of 467. May contribute to his increasing potassium and dowtrending Na. Will increase to: - Lantus 80 units BID, Aspart 10  units TID with meals - SSI resistant - Hgb A1c 9.1  #Hyperkalemia: Patient presents with an elevated creatinine and a potassium of5.4. Patient does take oral potassium at home. Now 4.7 today.  #Liver mass Incidental liver mass on Renal US with CT showing possible hemangioma. Patient asymptomatic does not follow with GI. Appears to have cirrhotic changes on CT in 2010.  He will need outpatient GI follow up for management for cirrhosis.  -MRI abdomen  - Follow up on Hepatitis panel, ceruplasmin, alpha 1   Antitrypsin pending   #Normocytic Anemia:  Hgb of 9.8 MCV of 96 with unknown baseline - Iron, Ferritin, TIBC, Reticulocyte, unremarkable smear -Transfusion goal less than 7.  Thrombocytopenia: Patient has a platelet count of 86May be related to underlying liver cirrhosis -Screen for HIV -We will recheck CBC to make sure platelet count is correct. -Transfusion goals less than 50 with concern for bleeding and less than 20 otherwise.  #CAD: #HLF: #HTN: -Continue homeatorvastatin 40 mgmedication. - Continue Amlodipine 10 mg - Continue Metoprolol 40 mg  - Hold LSN-HCTZ 20-12.5 mg   #Depression: - Continue home SSRI   #BPH  - Continue home Flomax  Diet:Carb-Modified XHB:ZJIR,CVEL FYB:OFBPZWCHEN Code:Full PT/OT recs:Pending,none. TOC recs:pending Dispo: Anticipated discharge in approximately 3 day(s).    Iona Carreto, MD 06/04/2020, 1:27 PM Pager: [phone] After 5pm on weekdays and 1pm on weekends: On Call pager [phone]

## 2020-06-04 NOTE — Progress Notes (Signed)
Inpatient Diabetes Program Recommendations  AACE/ADA: New Consensus Statement on Inpatient Glycemic Control (2015)  Target Ranges:  Prepandial:   less than 140 mg/dL      Peak postprandial:   less than 180 mg/dL (1-2 hours)      Critically ill patients:  140 - 180 mg/dL   Lab Results  Component Value Date   GLUCAP 400 (H) 06/04/2020   HGBA1C 9.1 (H) 06/01/2020    Review of Glycemic Control Results for Nicholas Rice, ADVINCULA (MRN [id number]) as of 06/04/2020 13:44  Ref. Range 06/03/2020 16:29 06/03/2020 21:04 06/04/2020 06:25 06/04/2020 10:22 06/04/2020 11:31  Glucose-Capillary Latest Ref Range: 70 - 99 mg/dL 300 (H) 324 (H) 325 (H) 488 (H) 400 (H)   Diabetes history: DM2 Outpatient Diabetes medications: Glipizide XL 10 mg BID, NPH 140 units TID (in am, at noon, at bedtime), Metformin 850 mg TID, Toujeo 50 unit QHS Current orders for Inpatient glycemic control: Lantus 40 units bid, Novolog 0-20 units tid with meals, Novolog 10 units tid with meals  Inpatient Diabetes Program Recommendations:     Please consider increasing Lantus to 60 units bid.  Also consider increasing Novolog meal coverage to 15 units tid with meals.   Thanks,  Adah Perl, RN, BC-ADM Inpatient Diabetes Coordinator Pager [phone] (8a-5p)

## 2020-06-04 NOTE — Progress Notes (Signed)
Attempted lower extremity venous duplex, however patient is sleeping in the chair. Will attempt again as schedule permits.  06/04/2020 4:49 PM Kelby Aline., MHA, RVT, RDCS, RDMS

## 2020-06-05 ENCOUNTER — Inpatient Hospital Stay (HOSPITAL_COMMUNITY): Payer: Medicare HMO

## 2020-06-05 DIAGNOSIS — M79609 Pain in unspecified limb: Secondary | ICD-10-CM

## 2020-06-05 LAB — BASIC METABOLIC PANEL
Anion gap: 11 (ref 5–15)
Anion gap: 10 (ref 5–15)
Anion gap: 10 (ref 5–15)
BUN: 48 mg/dL — ABNORMAL HIGH (ref 8–23)
BUN: 47 mg/dL — ABNORMAL HIGH (ref 8–23)
BUN: 47 mg/dL — ABNORMAL HIGH (ref 8–23)
CO2: 20 mmol/L — ABNORMAL LOW (ref 22–32)
CO2: 21 mmol/L — ABNORMAL LOW (ref 22–32)
CO2: 21 mmol/L — ABNORMAL LOW (ref 22–32)
Calcium: 8.8 mg/dL — ABNORMAL LOW (ref 8.9–10.3)
Calcium: 9.2 mg/dL (ref 8.9–10.3)
Calcium: 8.9 mg/dL (ref 8.9–10.3)
Chloride: 100 mmol/L (ref 98–111)
Chloride: 104 mmol/L (ref 98–111)
Chloride: 101 mmol/L (ref 98–111)
Creatinine, Ser: 1.58 mg/dL — ABNORMAL HIGH (ref 0.61–1.24)
Creatinine, Ser: 1.6 mg/dL — ABNORMAL HIGH (ref 0.61–1.24)
Creatinine, Ser: 1.55 mg/dL — ABNORMAL HIGH (ref 0.61–1.24)
GFR calc Af Amer: 52 mL/min — ABNORMAL LOW (ref >60)
GFR calc Af Amer: 52 mL/min — ABNORMAL LOW (ref >60)
GFR calc Af Amer: 54 mL/min — ABNORMAL LOW (ref >60)
GFR calc non Af Amer: 45 mL/min — ABNORMAL LOW (ref >60)
GFR calc non Af Amer: 45 mL/min — ABNORMAL LOW (ref >60)
GFR calc non Af Amer: 46 mL/min — ABNORMAL LOW (ref >60)
Glucose, Bld: 372 mg/dL — ABNORMAL HIGH (ref 70–99)
Glucose, Bld: 171 mg/dL — ABNORMAL HIGH (ref 70–99)
Glucose, Bld: 198 mg/dL — ABNORMAL HIGH (ref 70–99)
Potassium: 4.8 mmol/L (ref 3.5–5.1)
Potassium: 5.4 mmol/L — ABNORMAL HIGH (ref 3.5–5.1)
Potassium: 5.3 mmol/L — ABNORMAL HIGH (ref 3.5–5.1)
Sodium: 131 mmol/L — ABNORMAL LOW (ref 135–145)
Sodium: 135 mmol/L (ref 135–145)
Sodium: 132 mmol/L — ABNORMAL LOW (ref 135–145)

## 2020-06-05 LAB — CBC
HCT: 26.8 % — ABNORMAL LOW (ref 39.0–52.0)
HCT: 27.8 % — ABNORMAL LOW (ref 39.0–52.0)
Hemoglobin: 8.5 g/dL — ABNORMAL LOW (ref 13.0–17.0)
Hemoglobin: 8.8 g/dL — ABNORMAL LOW (ref 13.0–17.0)
MCH: 30.1 pg (ref 26.0–34.0)
MCH: 29.6 pg (ref 26.0–34.0)
MCHC: 31.7 g/dL (ref 30.0–36.0)
MCHC: 31.7 g/dL (ref 30.0–36.0)
MCV: 95 fL (ref 80.0–100.0)
MCV: 93.6 fL (ref 80.0–100.0)
Platelets: 104 10*3/uL — ABNORMAL LOW (ref 150–400)
Platelets: 106 10*3/uL — ABNORMAL LOW (ref 150–400)
RBC: 2.82 MIL/uL — ABNORMAL LOW (ref 4.22–5.81)
RBC: 2.97 MIL/uL — ABNORMAL LOW (ref 4.22–5.81)
RDW: 15.1 % (ref 11.5–15.5)
RDW: 14.8 % (ref 11.5–15.5)
WBC: 5.7 10*3/uL (ref 4.0–10.5)
WBC: 6.3 10*3/uL (ref 4.0–10.5)
nRBC: 0 % (ref 0.0–0.2)
nRBC: 0 % (ref 0.0–0.2)

## 2020-06-05 LAB — GLUCOSE, CAPILLARY
Glucose-Capillary: 319 mg/dL — ABNORMAL HIGH (ref 70–99)
Glucose-Capillary: 482 mg/dL — ABNORMAL HIGH (ref 70–99)
Glucose-Capillary: 443 mg/dL — ABNORMAL HIGH (ref 70–99)
Glucose-Capillary: 308 mg/dL — ABNORMAL HIGH (ref 70–99)
Glucose-Capillary: 274 mg/dL — ABNORMAL HIGH (ref 70–99)
Glucose-Capillary: 260 mg/dL — ABNORMAL HIGH (ref 70–99)
Glucose-Capillary: 170 mg/dL — ABNORMAL HIGH (ref 70–99)
Glucose-Capillary: 175 mg/dL — ABNORMAL HIGH (ref 70–99)
Glucose-Capillary: 216 mg/dL — ABNORMAL HIGH (ref 70–99)
Glucose-Capillary: 195 mg/dL — ABNORMAL HIGH (ref 70–99)

## 2020-06-05 LAB — ALPHA-1-ANTITRYPSIN: A-1 Antitrypsin, Ser: 203 mg/dL — ABNORMAL HIGH (ref 101–187)

## 2020-06-05 LAB — CERULOPLASMIN: Ceruloplasmin: 30.8 mg/dL (ref 16.0–31.0)

## 2020-06-05 MED ORDER — INSULIN ASPART 100 UNIT/ML ~~LOC~~ SOLN
0.0000 [IU] | SUBCUTANEOUS | Status: DC
  Administered 2020-06-05 – 2020-06-06 (×3): 4 [IU] via SUBCUTANEOUS
  Administered 2020-06-06: 7 [IU] via SUBCUTANEOUS
  Administered 2020-06-06 (×2): 4 [IU] via SUBCUTANEOUS
  Administered 2020-06-07: 15 [IU] via SUBCUTANEOUS
  Administered 2020-06-07 – 2020-06-08 (×2): 7 [IU] via SUBCUTANEOUS
  Administered 2020-06-08: 3 [IU] via SUBCUTANEOUS
  Administered 2020-06-08: 11 [IU] via SUBCUTANEOUS
  Administered 2020-06-08: 3 [IU] via SUBCUTANEOUS
  Administered 2020-06-09: 4 [IU] via SUBCUTANEOUS
  Administered 2020-06-09: 7 [IU] via SUBCUTANEOUS
  Administered 2020-06-09: 11 [IU] via SUBCUTANEOUS
  Administered 2020-06-09: 3 [IU] via SUBCUTANEOUS
  Administered 2020-06-10: 4 [IU] via SUBCUTANEOUS
  Administered 2020-06-10: 7 [IU] via SUBCUTANEOUS
  Administered 2020-06-10: 4 [IU] via SUBCUTANEOUS

## 2020-06-05 MED ORDER — PANTOPRAZOLE SODIUM 40 MG PO TBEC
40.0000 mg | DELAYED_RELEASE_TABLET | Freq: Every day | ORAL | Status: DC
  Administered 2020-06-05 – 2020-06-10 (×6): 40 mg via ORAL
  Filled 2020-06-05 (×6): qty 1

## 2020-06-05 MED ORDER — INSULIN REGULAR(HUMAN) IN NACL 100-0.9 UT/100ML-% IV SOLN
INTRAVENOUS | Status: DC
  Administered 2020-06-05: 13 [IU]/h via INTRAVENOUS
  Filled 2020-06-05 (×2): qty 100

## 2020-06-05 MED ORDER — POLYETHYLENE GLYCOL 3350 17 G PO PACK
17.0000 g | PACK | Freq: Every day | ORAL | Status: DC
  Administered 2020-06-07 – 2020-06-10 (×4): 17 g via ORAL
  Filled 2020-06-05 (×5): qty 1

## 2020-06-05 MED ORDER — INSULIN GLARGINE 100 UNIT/ML ~~LOC~~ SOLN
120.0000 [IU] | Freq: Two times a day (BID) | SUBCUTANEOUS | Status: DC
  Administered 2020-06-05: 120 [IU] via SUBCUTANEOUS
  Filled 2020-06-05 (×2): qty 1.2

## 2020-06-05 MED ORDER — FUROSEMIDE 80 MG PO TABS
80.0000 mg | ORAL_TABLET | Freq: Two times a day (BID) | ORAL | Status: DC
  Administered 2020-06-06 – 2020-06-10 (×9): 80 mg via ORAL
  Filled 2020-06-05 (×9): qty 1

## 2020-06-05 MED ORDER — DEXTROSE 50 % IV SOLN: 0.0000 mL | INTRAVENOUS | Status: DC | PRN

## 2020-06-05 MED ORDER — INSULIN GLARGINE 100 UNIT/ML ~~LOC~~ SOLN
150.0000 [IU] | Freq: Two times a day (BID) | SUBCUTANEOUS | Status: DC
  Administered 2020-06-05 – 2020-06-10 (×10): 150 [IU] via SUBCUTANEOUS
  Filled 2020-06-05 (×11): qty 1.5

## 2020-06-05 MED ORDER — DEXTROSE-NACL 5-0.45 % IV SOLN: INTRAVENOUS | Status: DC

## 2020-06-05 MED ORDER — FUROSEMIDE 40 MG PO TABS
60.0000 mg | ORAL_TABLET | Freq: Two times a day (BID) | ORAL | Status: DC
  Administered 2020-06-05: 60 mg via ORAL
  Filled 2020-06-05: qty 1

## 2020-06-05 NOTE — Progress Notes (Signed)
Patient expressed desire to leave and "come back Monday". Explained to patient that this is against medical advice. Physician notified, will speak to patient. Patient currently sitting in chair in room.

## 2020-06-05 NOTE — Care Management Important Message (Signed)
Important Message  Patient Details  Name: Americo Vallery MRN: [id number] Date of Birth: [birth year]   Medicare Important Message Given:  Yes     Sherwin Hollingshed 06/05/2020, 11:12 AM

## 2020-06-05 NOTE — Progress Notes (Signed)
Right lower extremity venous duplex completed. Refer to "CV Proc" under chart review to view preliminary results.  06/05/2020 3:19 PM Kelby Aline., MHA, RVT, RDCS, RDMS

## 2020-06-05 NOTE — Progress Notes (Addendum)
Subjective:  His breathing is better today, no longer feels much tightness in his chest. He states legs are more swollen today and pain in back of the right knee.  Feels like his belly is more swollen. Stools were black at home but attributes this to black cherries that he had eaten. Las bm 2 days ago stools were somewhat yellow and somewhat black. Denies CP, SOB, abdominal pain, or weakness.  Objective:  Vital signs in last 24 hours: Vitals:   06/05/20 0459 06/05/20 0800 06/05/20 1055 06/05/20 1149  BP: (!) 121/43 (!) 122/57 (!) 120/54 117/66  Pulse: 83 80 72 75  Resp: 18 20  18   Temp: 98.2 F (36.8 C) 98.7 F (37.1 C) 98.1 F (36.7 C) 98 F (36.7 C)  TempSrc: Oral Oral Oral Oral  SpO2: 91% 92% 98% 96%  Weight:      Height:       Physical Exam Constitutional:      Appearance: Normal appearance.  Cardiovascular:     Rate and Rhythm: Normal rate and regular rhythm.     Pulses: Normal pulses.     Heart sounds: Normal heart sounds.  Pulmonary:     Effort: Pulmonary effort is normal.     Breath sounds: Normal breath sounds. No wheezing.  Abdominal:     General: Bowel sounds are normal.     Palpations: Abdomen is soft.     Comments: Mildly distended  Musculoskeletal:     Comments: 2+ pitting edema to mid thighs  Skin:    Capillary Refill: Capillary refill takes less than 2 seconds.  Neurological:     Mental Status: He is alert.      Assessment/Plan:  Active Problems:   Pancytopenia (HCC)   Hypertension   Diabetes ([place])   Stage 3 chronic kidney disease   CAP (community acquired pneumonia)   Effusion of lower leg joint   AKI (acute kidney injury) ([place])  Kristi Hyer a 64 y.o.with pertinent PMH of CKD stage 3, HTN, T2DM, Anemia, GERD, BPH, HLD, CAD (previously told he has angina)HFpEF (EF 60%)who presented with a 1 month history of shortness of breath progressively got worse over the last weekand admit for community-acquired pneumonia complicated by acute  kidney injury, leg edema and pain.  #CAP Patientpresents with 1 month history of shortness of breath with exertion that progressively got worse over the last week. He does admit to fevers, chills,and cough during this time. Patient is hemodynamically stable without leukocytosis or lactic acidosis. However, he does have imaging consistent with multilobar pneumonia. Now with resolved dypsnea -Procalcitoninnegative -Blood culturesno growth 2 days -Continue CAP coverage with azithromycin and ceftriaxone, on day 4  #HFpEF (EF of 60 - 65%) Patient has a history of heart failure with preserved ejection fraction with EF 60% on recent echo. Recent stress test did not show any evidence of ischemic cardiomyopathy. Patient does currently take Lasix for lower extremity edema which is likely secondary to venous insufficiency. Bilateral leg edema worsening today - Unna boots - Increase Furosemide 60 mg BID  #Possible AKI vs CKD Cr of 1.58 todaydown from 1.75 no baseline for comparison.There is some note of possible CKD stage III in his chart. - Will hold ACEI BP stable around 120s/50s - Avoid nephrotoxic medications - Renal US unremarkable without hydronephrosis - Strict I&O  #Knee Pain  Patient with left knee pain and swelling concerning for gout vs prepatellar bursitis sp dry tap on 06/03/2020. Now with right posterior knee pain no swelling  concerning for possible DVT. US doppler of LLE negative for DVT 2 days ago. Likely due to worsening leg swelling. - US doppler of right leg for DVT shows no DVT - Volatern gel TID for  Left pain - IV dilaudid 1 mg PRN q4 for right knee pain -Unna boots  #T2DM Glucose of319 today. Started on insulin drip for continue hyperglycemia will calculate insulin requirements once glucose is better controlled. - insulin drip - Hgb A1c9.1  #Hyperkalemia: Improving Patient presents with an elevated creatinine and a potassium of5.4. Patient does take oral  potassium at home.Now 4.8 today.  #Liver mass Incidental liver mass on Renal US with CT showing possible hemangioma. Patient asymptomatic does not follow with GI. Appears to have cirrhotic changes on CT in 2010.  He will need outpatient GI follow up for management for cirrhosis.  -MRI abdomen with lesions in the liver two displaying "fatty metamorphosis" likely dysplastic nodules, characterized based on LI-RADS criteria as LR category 3, indeterminate. Three to six-month follow-up MRI with multiphase evaluation is suggested.  - Will need GI follow up - Viral hepatitis panel negative, ceruloplasmin 30.8, alpha 1  Antitrypsin 203  #Constipation Patient without BM for the past 2 days.  -Miralax 17g daily  #Normocytic Anemia:  Acute drop of Hgb from 9.5 to 8.5. Notes 1 episode of dark stools prior to admission no bowel movements in the last 2 days - Iron, Ferritin, TIBC, suggestive of iron deficiently, Reticulocyte, unremarkable smear -Repeat CBC tonight -Transfusion goal less than 7.  Thrombocytopenia: Patient has a platelet count of 86May be related to underlying liver cirrhosis -Screen for HIV -We will recheck CBC to make sure platelet count is correct. -Transfusion goals less than 50 with concern for bleeding and less than 20 otherwise.  #CAD: #HLF: #HTN: -Continue homeatorvastatin 40 mgmedication. - Continue Amlodipine 10 mg - Continue Metoprolol 40 mg  - Hold LSN-HCTZ 20-12.5 mg   #Depression: - Continue home SSRI   #BPH  - Continue home Flomax - pending post void residual volume  Diet:Carb-Modified YSH:UOHF GBM:[id number] Code:Full PT/OT recs:Pending,none. TOC recs:pending Dispo: Anticipated discharge in approximately32day(s).  Iona Hudgins, MD 06/05/2020, 12:53 PM Pager: [phone] After 5pm on weekdays and 1pm on weekends: On Call pager [phone]

## 2020-06-05 NOTE — Progress Notes (Signed)
Patient is refusing telemetry. Educated, still refuses citing "it won't take but a minute to put it back on".

## 2020-06-05 NOTE — Progress Notes (Signed)
Inpatient Diabetes Program Recommendations  AACE/ADA: New Consensus Statement on Inpatient Glycemic Control (2015)  Target Ranges:  Prepandial:   less than 140 mg/dL      Peak postprandial:   less than 180 mg/dL (1-2 hours)      Critically ill patients:  140 - 180 mg/dL   Lab Results  Component Value Date   GLUCAP 482 (H) 06/05/2020   HGBA1C 9.1 (H) 06/01/2020    Review of Glycemic Control Results for Nicholas Rice, Nicholas Rice (MRN [id number]) as of 06/05/2020 10:37  Ref. Range 06/04/2020 11:31 06/04/2020 17:05 06/04/2020 21:11 06/05/2020 06:00 06/05/2020 09:59  Glucose-Capillary Latest Ref Range: 70 - 99 mg/dL 400 (H) 222 (H) 318 (H) 319 (H) 482 (H)   Diabetes history:DM2 Outpatient Diabetes medications:Glipizide XL 10 mg BID, NPH 140 units TID (in am, at noon, at bedtime), Metformin 850 mg TID, Toujeo 50 unit QHS Current orders for Inpatient glycemic control:Lantus 120 units bid, Novolog 0-20 units tid with meals, Novolog 10 units tid with meals  Inpatient Diabetes Program Recommendations:     Consider increasing Novolog meal coverage to 15 units tid with meals.   Thanks, Bronson Curb, MSN, RNC-OB Diabetes Coordinator [phone] (8a-5p)

## 2020-06-05 NOTE — Progress Notes (Signed)
Pt refusing continuous telemetry monitoring this shift. Physician notified. Ok to leave monitor for now. Pt awake in bed. Call light in reach. Will con't to monitor.

## 2020-06-06 ENCOUNTER — Encounter: Payer: Self-pay | Admitting: Nurse Practitioner

## 2020-06-06 ENCOUNTER — Other Ambulatory Visit: Payer: Medicare HMO

## 2020-06-06 LAB — CBC
HCT: 25.1 % — ABNORMAL LOW (ref 39.0–52.0)
Hemoglobin: 8.2 g/dL — ABNORMAL LOW (ref 13.0–17.0)
MCH: 30.9 pg (ref 26.0–34.0)
MCHC: 32.7 g/dL (ref 30.0–36.0)
MCV: 94.7 fL (ref 80.0–100.0)
Platelets: 104 10*3/uL — ABNORMAL LOW (ref 150–400)
RBC: 2.65 MIL/uL — ABNORMAL LOW (ref 4.22–5.81)
RDW: 14.8 % (ref 11.5–15.5)
WBC: 6.1 10*3/uL (ref 4.0–10.5)
nRBC: 0 % (ref 0.0–0.2)

## 2020-06-06 LAB — BASIC METABOLIC PANEL
Anion gap: 9 (ref 5–15)
BUN: 47 mg/dL — ABNORMAL HIGH (ref 8–23)
CO2: 21 mmol/L — ABNORMAL LOW (ref 22–32)
Calcium: 8.8 mg/dL — ABNORMAL LOW (ref 8.9–10.3)
Chloride: 104 mmol/L (ref 98–111)
Creatinine, Ser: 1.44 mg/dL — ABNORMAL HIGH (ref 0.61–1.24)
GFR calc Af Amer: 59 mL/min — ABNORMAL LOW (ref >60)
GFR calc non Af Amer: 51 mL/min — ABNORMAL LOW (ref >60)
Glucose, Bld: 149 mg/dL — ABNORMAL HIGH (ref 70–99)
Potassium: 4.2 mmol/L (ref 3.5–5.1)
Sodium: 134 mmol/L — ABNORMAL LOW (ref 135–145)

## 2020-06-06 LAB — CULTURE, BLOOD (ROUTINE X 2)
Culture: NO GROWTH
Culture: NO GROWTH

## 2020-06-06 LAB — METHYLMALONIC ACID, SERUM: Methylmalonic Acid, Quantitative: 239 nmol/L (ref 0–378)

## 2020-06-06 LAB — GLUCOSE, CAPILLARY
Glucose-Capillary: 165 mg/dL — ABNORMAL HIGH (ref 70–99)
Glucose-Capillary: 188 mg/dL — ABNORMAL HIGH (ref 70–99)
Glucose-Capillary: 189 mg/dL — ABNORMAL HIGH (ref 70–99)
Glucose-Capillary: 190 mg/dL — ABNORMAL HIGH (ref 70–99)
Glucose-Capillary: 213 mg/dL — ABNORMAL HIGH (ref 70–99)
Glucose-Capillary: 97 mg/dL (ref 70–99)

## 2020-06-06 LAB — ALBUMIN: Albumin: 2.9 g/dL — ABNORMAL LOW (ref 3.5–5.0)

## 2020-06-06 MED ORDER — FLUTICASONE PROPIONATE 50 MCG/ACT NA SUSP
2.0000 | Freq: Every day | NASAL | Status: DC
  Administered 2020-06-06 – 2020-06-09 (×4): 2 via NASAL
  Filled 2020-06-06: qty 16

## 2020-06-06 NOTE — Progress Notes (Signed)
Orthopedic Tech Progress Note Patient Details:  Nicholas Rice Jun 15, 1956 [id number]  Ortho Devices Type of Ortho Device: Haematologist Ortho Device/Splint Location: BLE Ortho Device/Splint Interventions: Ordered, Application   Post Interventions Patient Tolerated: Well Instructions Provided: Care of device   Janit Pagan 06/06/2020, 8:10 AM

## 2020-06-06 NOTE — Progress Notes (Signed)
0800: RN in to complete measurement of post void residual. Patient states he does not have to urinate at this time. RN informed patient to call once he has urinated for RN to perform bladder scan.   0845: RN in to check to see if patient has voided. Patient informed RN that he has voided and did not use the urinal. RN bladder scanned patient with a result of 40cc post void residual. RN educated patient on importance of using urinal for accurate measurement. Patient states understanding. Urinal placed within reach.   RN also asked patient if he would consider being placed back on the tele monitor. Patient refused.   RN will continue to monitor.

## 2020-06-06 NOTE — Progress Notes (Signed)
Internal Medicine Resident called to discuss patient. He has known cirrhosis ( not followed by GI apparently) and this admission found to have liver lesions on imaging. Cirrhosis is compensated, no acute issues.   Plan:  Admitting team to obtain AFP and call us for consult if elevated. Otherwise patient will see Dr. Rush Landmark in the office 08/01/20 at 2:50 pm

## 2020-06-06 NOTE — Progress Notes (Signed)
Notified by RN that patient wanted to leave AMA. Patient seen at bedside and patient was asked why he wanted to leave AMA. He notes that his legs are still swollen, he is in pain, and he feels as though nothing is being done.   I further explained to him that we increased his lasix to help with the fluid. He also notes having muscle spasms in his lower extremities, he has not been taking his magnesium since his admission. He was also tearful upon my examination when he began discussing his chronic fecal and urinary incontinence, he has an upcoming appointment with neurology. He notes this is frustrating and affecting his quality of life. I discussed with him that the neurology follow up would be the next step in the workup. He then proceeded to discuss with me that he has a youthful mind but his body won't let him do certain activities anymore. We discussed this further and I recommended counseling upon discharge to help discuss these things further. The patient acknowledged the idea and said he will think about it. He then decided to stay.   Will discuss above findings with the day team.

## 2020-06-06 NOTE — Progress Notes (Addendum)
Subjective:  Patient reports that his legs feel like they are less swollen today, he also feels that his breathing is doing a little bit better. He feels that whatever we are doing seems to be working.  He feels like the pain behind his knee is gone, now he feels like he has some pain in his ankle.  He is reporting some congestion in the morning and was requesting a spray to help with that.  He reports having a BM yesterday, denies any dark or bloody stools. It was more yellow in color. He had a colonoscopy 3-4 years ago, both times he had 5 polyps.  He reports that he had a little episode yesterday where he was wanting to leave. He reports that he was glad that he didn't leave.   Objective:  Vital signs in last 24 hours: Vitals:   06/05/20 1149 06/05/20 1956 06/06/20 0019 06/06/20 0357  BP: 117/66 (!) 126/56  (!) 127/48  Pulse: 75 82  80  Resp: 18 18  18   Temp: 98 F (36.7 C) 99 F (37.2 C)  97.7 F (36.5 C)  TempSrc: Oral Oral  Oral  SpO2: 96% 96%  95%  Weight:   125.5 kg   Height:       Physical Exam Constitutional:      Appearance: Normal appearance.  Cardiovascular:     Rate and Rhythm: Normal rate and regular rhythm.     Pulses: Normal pulses.     Heart sounds: Normal heart sounds.     Comments: No JVD Pulmonary:     Effort: Pulmonary effort is normal.     Breath sounds: Normal breath sounds. No wheezing.  Abdominal:     General: Bowel sounds are normal.     Palpations: Abdomen is soft.     Comments: Mildly distended  Musculoskeletal:     Comments: 2+ pitting edema to mid thighs  Skin:    Capillary Refill: Capillary refill takes less than 2 seconds.  Neurological:     Mental Status: He is alert.   Lower Venous DVTStudy   Indications: Pain.    Limitations: Left leg movement secondary to cramping.  Comparison Study: No prior study   Performing Technologist: Maudry Mayhew MHA, RDMS, RVT, RDCS     Examination Guidelines:  A complete evaluation  includes B-mode imaging, spectral Doppler, color  Doppler,  and power Doppler as needed of all accessible portions of each vessel.  Bilateral  testing is considered an integral part of a complete examination. Limited  examinations for reoccurring indications may be performed as noted. The  reflux  portion of the exam is performed with the patient in reverse  Trendelenburg.     +---------+---------------+---------+-----------+----------+--------------+   RIGHT  CompressibilityPhasicitySpontaneityPropertiesThrombus  Aging  +---------+---------------+---------+-----------+----------+--------------+   CFV   Full      Yes   Yes                    +---------+---------------+---------+-----------+----------+--------------+   SFJ   Full                                 +---------+---------------+---------+-----------+----------+--------------+   FV Prox Full                                 +---------+---------------+---------+-----------+----------+--------------+   FV Mid  Full                                 +---------+---------------+---------+-----------+----------+--------------+  FV DistalFull                                 +---------+---------------+---------+-----------+----------+--------------+   PFV   Full                                 +---------+---------------+---------+-----------+----------+--------------+   POP   Full      Yes   Yes                    +---------+---------------+---------+-----------+----------+--------------+   PTV   Full                                 +---------+---------------+---------+-----------+----------+--------------+   PERO   Full                                  +---------+---------------+---------+-----------+----------+--------------+   Left Technical Findings:  Not visualized segments include CFV.    Summary:  RIGHT:  - There is no evidence of deep vein thrombosis in the lower extremity.    - A cystic structure is found in the popliteal fossa.    *See table(s) above for measurements and observations.   Electronically signed by Servando Snare MD on 06/05/2020 at 4:47:29 PM.    Final   Assessment/Plan:  Active Problems:   Pancytopenia ([place])   Hypertension   Diabetes ([place])   Stage 3 chronic kidney disease   CAP (community acquired pneumonia)   Effusion of lower leg joint   AKI (acute kidney injury) ([place])  Nicholas Rice a 64 y.o.with pertinent PMH of CKD stage 3, HTN, T2DM, Anemia, GERD, BPH, HLD, CAD (previously told he has angina)HFpEF (EF 60%)who presented with a 1 month history of shortness of breath progressively got worse over the last weekand admit for community-acquired pneumonia complicated by acute kidney injury, leg edema and pain.  #Bilateral lower extremity edema #HFpEF (EF of 60 - 65%) Patient has a history of heart failure with preserved ejection fraction with EF 60% on recent echo. Recent stress test did not show any evidence of ischemic cardiomyopathy. Patient does currently take Lasix for lower extremity edema which is likely secondary to venous insufficiency. Bilateral leg edema feels improved today. Edema up to sacrum today, no JVD. Likely multifactorial due to CHF and liver cirrhosis with drop in albumin from 3.4 to 2.9. - Unna boots - Increase Furosemide 80 mg BID - Health healthy diet with fluid restriction - Strict I/Os  #T2DM Glucose of97 today. Now off insulin drip once blood sugars have improved. Glucose appears better controled today - Continue Lantus 150 BID - Hgb A1c9.1  #Cirrhosis #Liver lesions Incidental liver mass on Renal US with CT  showing possible hemangioma. Patient asymptomatic does not follow with GI. Appears to have cirrhotic changes on CT in 2010.  He will need outpatient GI follow up for management for cirrhosis.  -MRI abdomen with lesions in the liver two displaying "fatty metamorphosis" likely dysplastic nodules, characterized based on LI-RADS criteria as LR category 3, indeterminate.  - Will need GI follow up, Case discussed with GI PA who recommended checking and AFP and will biopsy the patient if positive. - Follow up AFP - Viral hepatitis panel negative, ceruloplasmin 30.8, alpha-1 Antitrypsin 203  #Possible AKI vs  CKD Cr of 1.44 todaydown from 1.75 no baseline for comparison.There is some note of possible CKD stage III in his chart. - Will hold ACEI BP stable around 120s/50s - Avoid nephrotoxic medications - Renal US unremarkable without hydronephrosis - Strict I&O #Hyperkalemia: Improving Patient presents with an elevated potassium 4.3. Patient does take oral potassium at home.Normalized to K 4.2 today Continuing to monitor  #Right Knee Pain- resolved Now resolved DVT study on right knees reveals popliteal cyst which may have contributed to his pain. No DVT in right knee - IV dilaudid 1 mg PRN q4  - Unna boots  #Constipation Patient reports a non bloody BM this morning.  -Encouraged to continue with  Miralax 17g daily  #CAP- resolved Patientpresents with 1 month history of shortness of breath with exertion that progressively got worse over the last week. He does admit to fevers, chills,and cough during this time. Patient is hemodynamically stable without leukocytosis or lactic acidosis. However, he does have imaging consistent with multilobar pneumonia. Now with resolved dypsnea -Completed 5 days CAP coverage with azithromycin and ceftriaxone  #Normocytic Anemia:  Hgb 8.2 today. Notes 1 episode of dark stools prior to admission no bowel movements in the last 2 days - Iron, Ferritin,  TIBC, suggestive of iron deficiency  - Reticulocyte elevated unremarkable smear -Transfusion goal less than 7.  Thrombocytopenia: Platelet count improving to 104May be related to underlying liver cirrhosis - HIV screen non reactive -Transfusion goals less than 50 with concern for bleeding and less than 20 otherwise.  #CAD: #HLF: #HTN: -Continue homeatorvastatin 40 mgmedication. - Continue Amlodipine 10 mg - Continue Metoprolol 40 mg  - Hold LSN-HCTZ 20-12.5 mg   #Depression: - Continue home SSRI   #BPH  - Continue home Flomax - pending post void residual volume  Diet:Heath health VHQ:IONG EXB:[id number] Code:Full PT/OT recs:Pending,none. TOC recs:pending Dispo: Anticipated discharge in approximately2day(s).  Iona Tagliaferri, MD 06/06/2020, 8:44 AM Pager: [phone] After 5pm on weekdays and 1pm on weekends: On Call pager [phone]

## 2020-06-07 LAB — CBC
HCT: 26.8 % — ABNORMAL LOW (ref 39.0–52.0)
Hemoglobin: 8.7 g/dL — ABNORMAL LOW (ref 13.0–17.0)
MCH: 30.4 pg (ref 26.0–34.0)
MCHC: 32.5 g/dL (ref 30.0–36.0)
MCV: 93.7 fL (ref 80.0–100.0)
Platelets: 116 10*3/uL — ABNORMAL LOW (ref 150–400)
RBC: 2.86 MIL/uL — ABNORMAL LOW (ref 4.22–5.81)
RDW: 15 % (ref 11.5–15.5)
WBC: 6.9 10*3/uL (ref 4.0–10.5)
nRBC: 0 % (ref 0.0–0.2)

## 2020-06-07 LAB — COMPREHENSIVE METABOLIC PANEL
ALT: 64 U/L — ABNORMAL HIGH (ref 0–44)
AST: 85 U/L — ABNORMAL HIGH (ref 15–41)
Albumin: 2.8 g/dL — ABNORMAL LOW (ref 3.5–5.0)
Alkaline Phosphatase: 106 U/L (ref 38–126)
Anion gap: 12 (ref 5–15)
BUN: 48 mg/dL — ABNORMAL HIGH (ref 8–23)
CO2: 20 mmol/L — ABNORMAL LOW (ref 22–32)
Calcium: 8.6 mg/dL — ABNORMAL LOW (ref 8.9–10.3)
Chloride: 102 mmol/L (ref 98–111)
Creatinine, Ser: 1.61 mg/dL — ABNORMAL HIGH (ref 0.61–1.24)
GFR calc Af Amer: 51 mL/min — ABNORMAL LOW (ref >60)
GFR calc non Af Amer: 44 mL/min — ABNORMAL LOW (ref >60)
Glucose, Bld: 121 mg/dL — ABNORMAL HIGH (ref 70–99)
Potassium: 4.5 mmol/L (ref 3.5–5.1)
Sodium: 134 mmol/L — ABNORMAL LOW (ref 135–145)
Total Bilirubin: 1.2 mg/dL (ref 0.3–1.2)
Total Protein: 6.6 g/dL (ref 6.5–8.1)

## 2020-06-07 LAB — GLUCOSE, CAPILLARY
Glucose-Capillary: 107 mg/dL — ABNORMAL HIGH (ref 70–99)
Glucose-Capillary: 243 mg/dL — ABNORMAL HIGH (ref 70–99)
Glucose-Capillary: 340 mg/dL — ABNORMAL HIGH (ref 70–99)
Glucose-Capillary: 432 mg/dL — ABNORMAL HIGH (ref 70–99)
Glucose-Capillary: 62 mg/dL — ABNORMAL LOW (ref 70–99)
Glucose-Capillary: 82 mg/dL (ref 70–99)

## 2020-06-07 MED ORDER — INSULIN ASPART 100 UNIT/ML ~~LOC~~ SOLN
25.0000 [IU] | Freq: Once | SUBCUTANEOUS | Status: AC
  Administered 2020-06-07: 25 [IU] via SUBCUTANEOUS

## 2020-06-07 NOTE — Progress Notes (Signed)
HD#6 Subjective:  Overnight Events: Patient wanted to leave AMA last night due to swollen legs and feeling like his pain wasn't getting any better.    Patient resting in bed states that he is feeling better today. He believes that his swelling has improved, particularly with the legs wraps. He continues to have pain in his knees and feet, but does states that his knee pain has improved. He believes his breathing has improved as well. Patient does state concerned regarding his liver lesion. I counseled him on his pathology results and discussed the importance of OP follow up in the future.   Objective:  Vital signs in last 24 hours: Vitals:   06/06/20 2012 06/07/20 0042 06/07/20 0400 06/07/20 0813  BP: 122/62 (!) 128/59 (!) 123/58 (!) 134/59  Pulse: 79 85 83 82  Resp: 18 18 20 20   Temp: 98.1 F (36.7 C) 98.9 F (37.2 C) 99.5 F (37.5 C) 97.8 F (36.6 C)  TempSrc: Oral Oral Oral Oral  SpO2: 96% 96% 91% 100%  Weight:   122.8 kg   Height:       Supplemental O2: Room Air SpO2: 100 % O2 Flow Rate (L/min): 2 L/min   Physical Exam:  Physical Exam Constitutional:      Appearance: Normal appearance. He is obese.  HENT:     Head: Normocephalic and atraumatic.  Eyes:     Extraocular Movements: Extraocular movements intact.  Cardiovascular:     Rate and Rhythm: Normal rate.     Pulses: Normal pulses.     Heart sounds: Normal heart sounds.  Pulmonary:     Effort: Pulmonary effort is normal. No respiratory distress.     Breath sounds: Normal breath sounds.  Abdominal:     General: There is no distension.     Tenderness: There is no abdominal tenderness.  Musculoskeletal:        General: Normal range of motion.     Cervical back: Normal range of motion.     Right lower leg: Edema present.     Left lower leg: Edema present.     Comments: LE edema improved with Unna boots, but still have significant edema    Skin:    General: Skin is warm and dry.  Neurological:      General: No focal deficit present.     Mental Status: He is alert and oriented to person, place, and time.     Filed Weights   06/05/20 0454 06/06/20 0019 06/07/20 0400  Weight: 124 kg 125.5 kg 122.8 kg     Intake/Output Summary (Last 24 hours) at 06/07/2020 1033 Last data filed at 06/07/2020 0815 Gross per 24 hour  Intake 1780 ml  Output 2150 ml  Net -370 ml   Net IO Since Admission: -1,724.43 mL [06/07/20 1033]  Pertinent Labs: CBC Latest Ref Rng & Units 06/07/2020 06/06/2020 06/05/2020  WBC 4.0 - 10.5 K/uL 6.9 6.1 6.3  Hemoglobin 13.0 - 17.0 g/dL 8.7(L) 8.2(L) 8.8(L)  Hematocrit 39 - 52 % 26.8(L) 25.1(L) 27.8(L)  Platelets 150 - 400 K/uL 116(L) 104(L) 106(L)    CMP Latest Ref Rng & Units 06/07/2020 06/06/2020 06/05/2020  Glucose 70 - 99 mg/dL 121(H) 149(H) 198(H)  BUN 8 - 23 mg/dL 48(H) 47(H) 47(H)  Creatinine 0.61 - 1.24 mg/dL 1.61(H) 1.44(H) 1.55(H)  Sodium 135 - 145 mmol/L 134(L) 134(L) 132(L)  Potassium 3.5 - 5.1 mmol/L 4.5 4.2 5.3(H)  Chloride 98 - 111 mmol/L 102 104 101  CO2 22 -  32 mmol/L 20(L) 21(L) 21(L)  Calcium 8.9 - 10.3 mg/dL 8.6(L) 8.8(L) 8.9  Total Protein 6.5 - 8.1 g/dL 6.6 - -  Total Bilirubin 0.3 - 1.2 mg/dL 1.2 - -  Alkaline Phos 38 - 126 U/L 106 - -  AST 15 - 41 U/L 85(H) - -  ALT 0 - 44 U/L 64(H) - -    Pending Labs: AFP   Imaging: No results found.  Assessment/Plan:   Active Problems:   Pancytopenia (HCC)   Hypertension   Diabetes ([place])   Stage 3 chronic kidney disease   CAP (community acquired pneumonia)   Effusion of lower leg joint   AKI (acute kidney injury) ([place])   Patient Summary: Nicholas Rice a 64 y.o.with pertinent PMH of CKD stage 3, HTN, T2DM, Anemia, GERD, BPH, HLD, CAD (previously told he has angina)HFpEF (EF 60%)who presented with a 1 month history of shortness of breath progressively got worse over the last weekand admit for community-acquired pneumonia complicated by acute kidney injury, leg edema and  pain.  #Bilateral lower extremity edema #HFpEF (EF of 60 - 65%) Patient has improvement of his LE edema with the unna boots, but continues to have 2+ edema above his knees. Unlikely that his LE edema is related to his HFpEF, but does have a history of well compensated cirrhosis and chronic venous insufficiency that could be contributing to his swelling.  Patient continues to have good diuresis with over 2 liters out yesterday. He did have a mild increase in his kidney function form yesterday form 1.4>1.6, but within normal limits for him. - Unna boots - Continue Furosemide 80 mg BID - HH diet, 1500 mL fluid restriction and strict I&O  #T2DM Controlled on current regimen - Continue Lantus 150 BID and SSI - Hgb A1c9.1  #Cirrhosis #Dysplasic Liver lesions Patient is well compensated without stigmata for cirrhosis. Will need OP follow up with GI.  - AFP pending  #Possible AKI vs CKD Renal function stable today. Cr of 1.61 and GFR of 44 - Continue to monitor daily with CMP  #Normocytic Anemia:  #Melana: Patient's Hgb is stable in the low to mid 8's. Denies any recent episodes of melena.  - Continue to monitor Hgb - Transfusion goal of < 7.  Thrombocytopenia: Platelet count stable. -Transfusion goals less than 50 with concern for bleeding and less than 20 otherwise.  #BPH  - Continue home Flomax - pending post void residual volume  Diet:Heath health WCB:JSEG BTD:VVOHYWVPXT Code:Full PT/OT recs:Pending,none. TOC recs:pending Dispo: Anticipated discharge in approximately2day(s)..    Dispo: Anticipated discharge to Home in 1 day.    Marianna Payment, D.O. [facility], PGY-2 Date 06/07/2020 Time 10:33 AM Pager: [phone] Please contact the on call pager after 5 pm and on weekends at [phone].

## 2020-06-07 NOTE — Evaluation (Signed)
Physical Therapy Evaluation Patient Details Name: Nicholas Rice MRN: [id number] DOB: 12-25-1955 Today's Date: 06/07/2020   History of Present Illness  Nicholas Rice is a 64 y.o. male with a pertinent PMH of CKD stage 3, HTN, T2DM, Anemia, GERD, BPH, HLD, CAD who presented with a 1 month history SOB. Chest x-ray significant for multifocal PNA. Pt also with L knee swelling and pain.   Clinical Impression  Pt admitted with above diagnosis. Pt presents with BLE weakness, especially LLE with decreased dorsiflexion and L knee hike with gait. Pt unsafe to ambulate without AD but did well with RW. He reports he has caught his L toe on the ground numerous times and tripped but it has gotten worse. Pt reports R knee pain with flexion and L plantar foot pain, especially in [place]. Rec HHPT for balance program and strengthening as well as RW. Also advised him to talk to his son about helping with shopping as his current ambulation tolerance is household distance but not functional for the community.  Pt currently with functional limitations due to the deficits listed below (see PT Problem List). Pt will benefit from skilled PT to increase their independence and safety with mobility to allow discharge to the venue listed below.       Follow Up Recommendations Home health PT;Supervision - Intermittent    Equipment Recommendations  Rolling walker with 5" wheels    Recommendations for Other Services OT consult     Precautions / Restrictions Precautions Precautions: Fall Restrictions Weight Bearing Restrictions: No      Mobility  Bed Mobility Overal bed mobility: Modified Independent             General bed mobility comments: pt able to come to EOB but uses UE to assist LE's  Transfers Overall transfer level: Needs assistance Equipment used: Rolling walker (2 wheeled) Transfers: Sit to/from Stand Sit to Stand: Min guard         General transfer comment: vc's for hand placement, min-guard  for safety, increased time needed and pt with very wide stance  Ambulation/Gait Ambulation/Gait assistance: Min assist Gait Distance (Feet): 20 Feet Assistive device: Rolling walker (2 wheeled) Gait Pattern/deviations: Steppage Gait velocity: decreased Gait velocity interpretation: <1.31 ft/sec, indicative of household ambulator General Gait Details: L steppage gait due to decreased df L ankle, pt reports that this is pre existing but has gotten worse  Financial trader [employer] (Stroke Patients Only)       Balance Overall balance assessment: Needs assistance Sitting-balance support: No upper extremity supported;Feet supported Sitting balance-Leahy Scale: Good     Standing balance support: No upper extremity supported Standing balance-Leahy Scale: Poor Standing balance comment: unsafe in standing without UE support                             Pertinent Vitals/Pain Pain Assessment: 0-10 Pain Score: 9  Pain Location: plantar surface and lateral L foot, R knee Pain Descriptors / Indicators: Stabbing Pain Intervention(s): Limited activity within patient's tolerance;Monitored during session    Home Living Family/patient expects to be discharged to:: Private residence Living Arrangements: Alone Available Help at Discharge: Family;Available PRN/intermittently Type of Home: Mobile home Home Access: Stairs to enter Entrance Stairs-Rails: None Entrance Stairs-Number of Steps: 5 Home Layout: One level Home Equipment: None Additional Comments: pt's son lives nearby but works all day  Prior Function Level of Independence: Independent         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        Extremity/Trunk Assessment   Upper Extremity Assessment Upper Extremity Assessment: RUE deficits/detail;LUE deficits/detail RUE Deficits / Details: strength overall WFL, neuropathy in hands RUE Sensation: history of  peripheral neuropathy RUE Coordination: decreased fine motor LUE Deficits / Details: strength WFL, neuropathy in hand LUE Sensation: history of peripheral neuropathy LUE Coordination: decreased fine motor    Lower Extremity Assessment Lower Extremity Assessment: LLE deficits/detail;RLE deficits/detail;Generalized weakness RLE Deficits / Details: swelling noted R knee, strength grossly 3/5 RLE Sensation: decreased proprioception RLE Coordination: decreased gross motor LLE Deficits / Details: pt lacking ankle df, knee ext 3/5, hip flex 2+/5 LLE Coordination: decreased gross motor    Cervical / Trunk Assessment Cervical / Trunk Assessment: Normal  Communication   Communication: No difficulties  Cognition Arousal/Alertness: Awake/alert Behavior During Therapy: WFL for tasks assessed/performed Overall Cognitive Status: Within Functional Limits for tasks assessed                                 General Comments: WFL for basic conversation and he can give an accurate history though he does relay that his son has been worried that he has some early dementia      General Comments General comments (skin integrity, edema, etc.): SPO2 92% on RA, HR 76 bpm. discussed general health and diet as it pertains to his glucose being so high and how that affects the pain he is having. Pt reports that he really likes bologna and hot dogs and knows that it's a problem but reports that eating healthier is more expensive.     Exercises     Assessment/Plan    PT Assessment Patient needs continued PT services  PT Problem List Decreased strength;Decreased range of motion;Decreased activity tolerance;Decreased balance;Decreased mobility;Decreased coordination;Decreased knowledge of use of DME;Decreased knowledge of precautions;Pain;Obesity       PT Treatment Interventions DME instruction;Gait training;Stair training;Functional mobility training;Therapeutic activities;Therapeutic  exercise;Balance training;Patient/family education    PT Goals (Current goals can be found in the Care Plan section)  Acute Rehab PT Goals Patient Stated Goal: return home PT Goal Formulation: With patient Time For Goal Achievement: 06/21/20 Potential to Achieve Goals: Good    Frequency Min 3X/week   Barriers to discharge        Co-evaluation               AM-PAC PT "6 Clicks" Mobility  Outcome Measure Help needed turning from your back to your side while in a flat bed without using bedrails?: None Help needed moving from lying on your back to sitting on the side of a flat bed without using bedrails?: None Help needed moving to and from a bed to a chair (including a wheelchair)?: A Little Help needed standing up from a chair using your arms (e.g., wheelchair or bedside chair)?: A Little Help needed to walk in hospital room?: A Little Help needed climbing 3-5 steps with a railing? : A Lot 6 Click Score: 19    End of Session Equipment Utilized During Treatment: Gait belt Activity Tolerance: Patient tolerated treatment well Patient left: in chair;with call bell/phone within reach Nurse Communication: Mobility status PT Visit Diagnosis: Unsteadiness on feet (R26.81);Muscle weakness (generalized) (M62.81);Difficulty in walking, not elsewhere classified (R26.2);Pain Pain - Right/Left:  (B) Pain - part of  body: Ankle and joints of foot;Knee    Time: 1226-1259 PT Time Calculation (min) (ACUTE ONLY): 33 min   Charges:   PT Evaluation $PT Eval Moderate Complexity: 1 Mod PT Treatments $Gait Training: 8-22 mins        Leighton Roach, [place]  Pager [phone] Office [place] 06/07/2020, 1:23 PM

## 2020-06-07 NOTE — Progress Notes (Signed)
Seen trash can in room filled with urine. Patient has been dumping urine in trash. Trash can emptied by tech. Education given to patient to not dump urinals.

## 2020-06-08 ENCOUNTER — Inpatient Hospital Stay (HOSPITAL_COMMUNITY): Payer: Medicare HMO

## 2020-06-08 LAB — GLUCOSE, CAPILLARY
Glucose-Capillary: 132 mg/dL — ABNORMAL HIGH (ref 70–99)
Glucose-Capillary: 62 mg/dL — ABNORMAL LOW (ref 70–99)
Glucose-Capillary: 131 mg/dL — ABNORMAL HIGH (ref 70–99)
Glucose-Capillary: 119 mg/dL — ABNORMAL HIGH (ref 70–99)
Glucose-Capillary: 239 mg/dL — ABNORMAL HIGH (ref 70–99)
Glucose-Capillary: 337 mg/dL — ABNORMAL HIGH (ref 70–99)

## 2020-06-08 LAB — COMPREHENSIVE METABOLIC PANEL
ALT: 64 U/L — ABNORMAL HIGH (ref 0–44)
AST: 69 U/L — ABNORMAL HIGH (ref 15–41)
Albumin: 2.6 g/dL — ABNORMAL LOW (ref 3.5–5.0)
Alkaline Phosphatase: 110 U/L (ref 38–126)
Anion gap: 10 (ref 5–15)
BUN: 55 mg/dL — ABNORMAL HIGH (ref 8–23)
CO2: 22 mmol/L (ref 22–32)
Calcium: 8.6 mg/dL — ABNORMAL LOW (ref 8.9–10.3)
Chloride: 101 mmol/L (ref 98–111)
Creatinine, Ser: 1.64 mg/dL — ABNORMAL HIGH (ref 0.61–1.24)
GFR calc Af Amer: 50 mL/min — ABNORMAL LOW (ref >60)
GFR calc non Af Amer: 43 mL/min — ABNORMAL LOW (ref >60)
Glucose, Bld: 132 mg/dL — ABNORMAL HIGH (ref 70–99)
Potassium: 4.4 mmol/L (ref 3.5–5.1)
Sodium: 133 mmol/L — ABNORMAL LOW (ref 135–145)
Total Bilirubin: 1.1 mg/dL (ref 0.3–1.2)
Total Protein: 6.7 g/dL (ref 6.5–8.1)

## 2020-06-08 LAB — CBC
HCT: 25.5 % — ABNORMAL LOW (ref 39.0–52.0)
Hemoglobin: 8 g/dL — ABNORMAL LOW (ref 13.0–17.0)
MCH: 29.2 pg (ref 26.0–34.0)
MCHC: 31.4 g/dL (ref 30.0–36.0)
MCV: 93.1 fL (ref 80.0–100.0)
Platelets: 123 10*3/uL — ABNORMAL LOW (ref 150–400)
RBC: 2.74 MIL/uL — ABNORMAL LOW (ref 4.22–5.81)
RDW: 14.7 % (ref 11.5–15.5)
WBC: 7.3 10*3/uL (ref 4.0–10.5)
nRBC: 0 % (ref 0.0–0.2)

## 2020-06-08 LAB — AFP TUMOR MARKER: AFP, Serum, Tumor Marker: 11.3 ng/mL — ABNORMAL HIGH (ref 0.0–8.3)

## 2020-06-08 IMAGING — DX DG CHEST 1V PORT
1 series · 1 of 1 positions shown · non-contrast
Comparison: Portable exam [03] hours compared to [DATE]

CLINICAL DATA: Shortness of breath, lower extremity swelling for
several months, hypoxia

EXAM:
PORTABLE CHEST 1 VIEW

[chest ap]
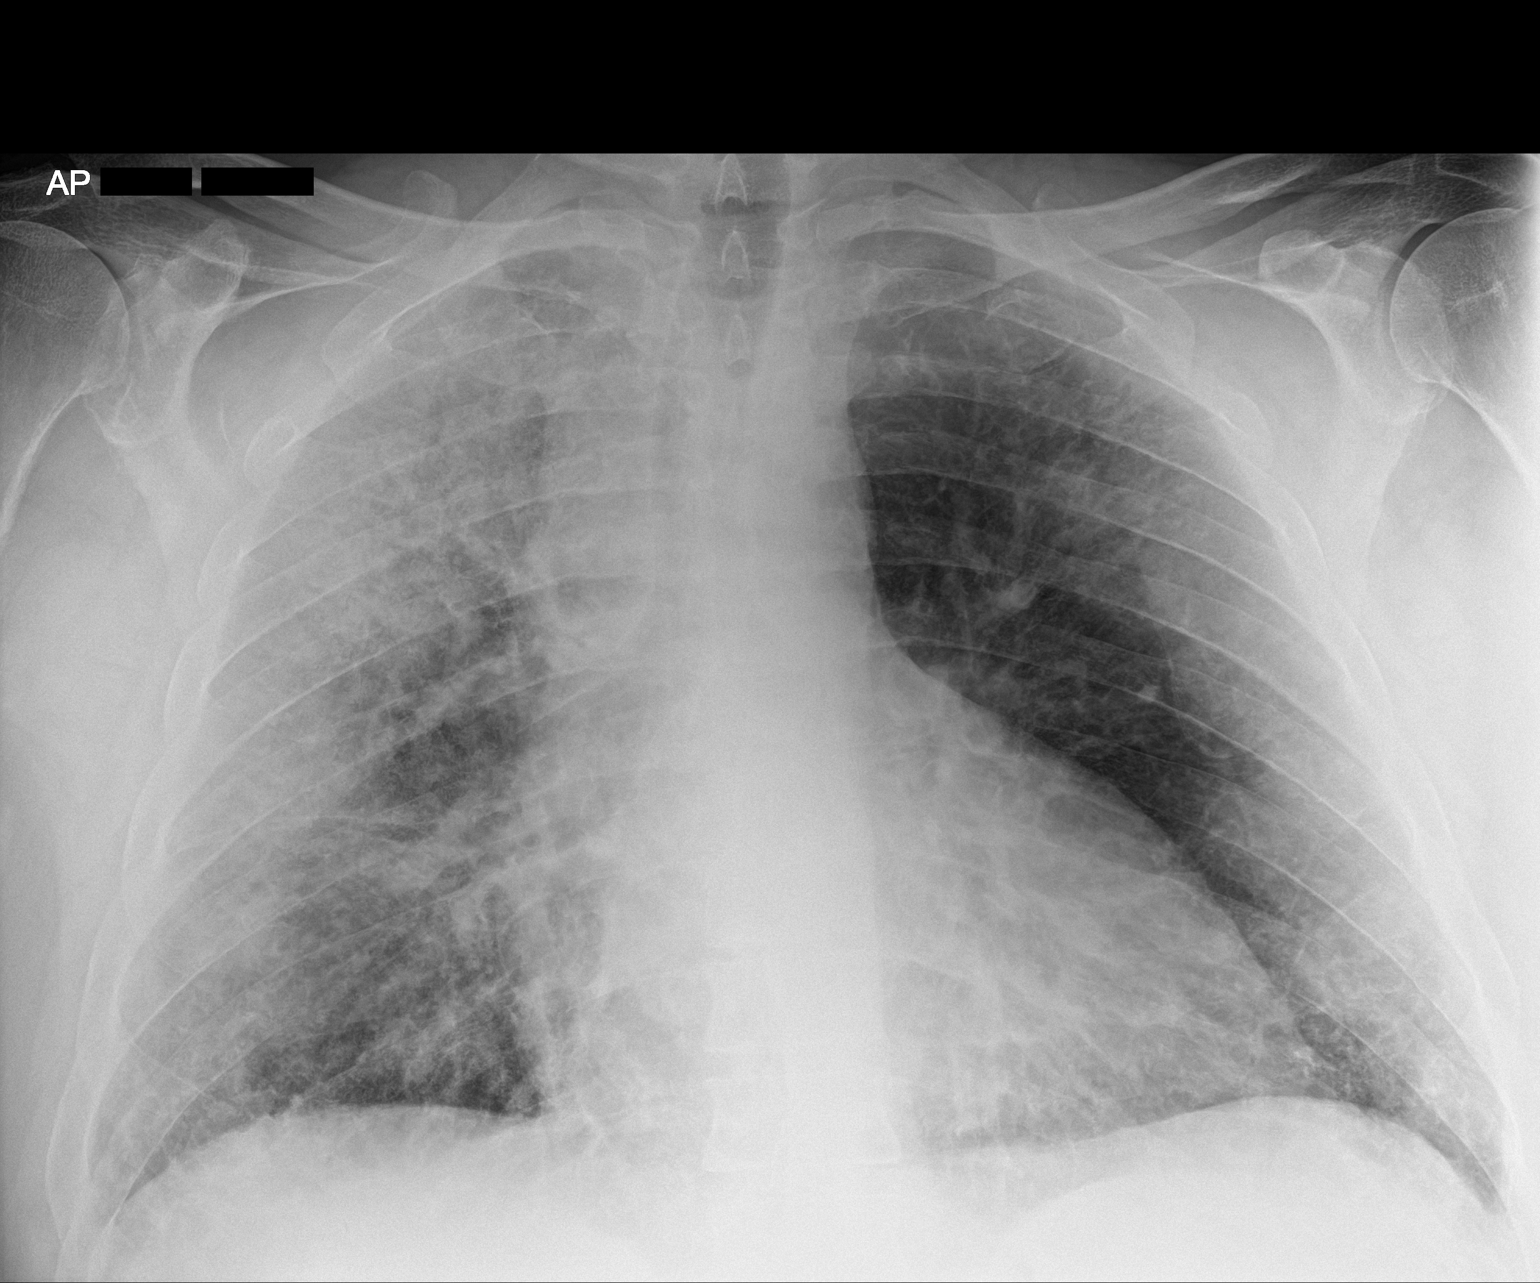

[1 of 1 positions shown; findings below may reference images not displayed]

FINDINGS: Enlargement of cardiac silhouette.

Mediastinal contours and pulmonary vascularity normal.

Patchy airspace infiltrates in the periphery of both lungs RIGHT
greater than LEFT favor multifocal pneumonia, increased since prior
exam.

No pleural effusion or pneumothorax.

Osseous structures unremarkable.
IMPRESSION: In both lungs RIGHT greater than LEFT, peripheral predominance,
favor multifocal pneumonia.

## 2020-06-08 MED ORDER — ACETAMINOPHEN 650 MG RE SUPP: 650.0000 mg | Freq: Four times a day (QID) | RECTAL | Status: DC | PRN

## 2020-06-08 MED ORDER — ACETAMINOPHEN 325 MG PO TABS: 650.0000 mg | ORAL_TABLET | Freq: Four times a day (QID) | ORAL | Status: DC | PRN

## 2020-06-08 MED ORDER — HYDROMORPHONE HCL 2 MG PO TABS
1.0000 mg | ORAL_TABLET | ORAL | Status: DC | PRN
  Administered 2020-06-08 – 2020-06-09 (×2): 1 mg via ORAL
  Filled 2020-06-08 (×3): qty 1

## 2020-06-08 NOTE — Progress Notes (Signed)
SATURATION QUALIFICATIONS: (This note is used to comply with regulatory documentation for home oxygen)  Patient Saturations on Room Air at Rest =91%  Patient Saturations on Room Air while Ambulating =84%  Patient Saturations on2 Liters of oxygen while Ambulating 96%  Please briefly explain why patient needs home oxygen: 

## 2020-06-08 NOTE — Progress Notes (Signed)
HD#7 Subjective:  Overnight Events: No overnight events.   Patient States that he is doing well. He continues to require some oxygen, but denies SHOB. He feels like many of his symptoms are resolving. He continues to have LE edema, but the Unna boots are helping him.   Objective:  Vital signs in last 24 hours: Vitals:   06/07/20 1737 06/07/20 1944 06/08/20 0050 06/08/20 0409  BP: 111/70 120/67 (!) 143/46 118/68  Pulse: 78 79 88 84  Resp: 20 18 18 20   Temp: 97.9 F (36.6 C) 98.6 F (37 C) 98.8 F (37.1 C) 98.9 F (37.2 C)  TempSrc: Oral Oral Oral Oral  SpO2: 96% 95% 96% 93%  Weight:   122.2 kg   Height:       Supplemental O2: RA to 2L [place] SpO2: 93 % O2 Flow Rate (L/min): 2 L/min   Physical Exam:  Physical Exam Constitutional:      Appearance: Normal appearance. He is obese.  HENT:     Head: Normocephalic and atraumatic.  Eyes:     Extraocular Movements: Extraocular movements intact.  Cardiovascular:     Rate and Rhythm: Normal rate.     Pulses: Normal pulses.     Heart sounds: Normal heart sounds.  Pulmonary:     Effort: Pulmonary effort is normal. No respiratory distress.     Breath sounds: Normal breath sounds.  Abdominal:     General: There is distension.     Palpations: Abdomen is soft.     Tenderness: There is no abdominal tenderness.  Musculoskeletal:        General: Swelling present. Normal range of motion.     Cervical back: Normal range of motion.     Right lower leg: Edema present.     Left lower leg: Edema present.     Comments: Unna boots in place. Still has 1-2+ edema bilaterally   Neurological:     General: No focal deficit present.     Mental Status: He is alert.  Psychiatric:        Mood and Affect: Mood normal.     Filed Weights   06/06/20 0019 06/07/20 0400 06/08/20 0050  Weight: 125.5 kg 122.8 kg 122.2 kg     Intake/Output Summary (Last 24 hours) at 06/08/2020 0514 Last data filed at 06/08/2020 0413 Gross per 24 hour  Intake  1782 ml  Output 2100 ml  Net -318 ml   Net IO Since Admission: -2,522.43 mL [06/08/20 0514]  Pertinent Labs: CBC Latest Ref Rng & Units 06/07/2020 06/06/2020 06/05/2020  WBC 4.0 - 10.5 K/uL 6.9 6.1 6.3  Hemoglobin 13.0 - 17.0 g/dL 8.7(L) 8.2(L) 8.8(L)  Hematocrit 39 - 52 % 26.8(L) 25.1(L) 27.8(L)  Platelets 150 - 400 K/uL 116(L) 104(L) 106(L)    CMP Latest Ref Rng & Units 06/07/2020 06/06/2020 06/05/2020  Glucose 70 - 99 mg/dL 121(H) 149(H) 198(H)  BUN 8 - 23 mg/dL 48(H) 47(H) 47(H)  Creatinine 0.61 - 1.24 mg/dL 1.61(H) 1.44(H) 1.55(H)  Sodium 135 - 145 mmol/L 134(L) 134(L) 132(L)  Potassium 3.5 - 5.1 mmol/L 4.5 4.2 5.3(H)  Chloride 98 - 111 mmol/L 102 104 101  CO2 22 - 32 mmol/L 20(L) 21(L) 21(L)  Calcium 8.9 - 10.3 mg/dL 8.6(L) 8.8(L) 8.9  Total Protein 6.5 - 8.1 g/dL 6.6 - -  Total Bilirubin 0.3 - 1.2 mg/dL 1.2 - -  Alkaline Phos 38 - 126 U/L 106 - -  AST 15 - 41 U/L 85(H) - -  ALT 0 - 44 U/L 64(H) - -    Pending Labs: none  Imaging: No results found.  Assessment/Plan:   Active Problems:   Pancytopenia (HCC)   Hypertension   Diabetes ([place])   Stage 3 chronic kidney disease   CAP (community acquired pneumonia)   Effusion of lower leg joint   AKI (acute kidney injury) ([place])     Patient Summary: Nicholas Rice a 64 y.o.with pertinent PMH of CKD stage 3, HTN, T2DM, Anemia, GERD, BPH, HLD, CAD (previously told he has angina)HFpEF (EF 60%)who presented with a 1 month history of shortness of breath progressively got worse over the last weekand admit for community-acquired pneumonia complicated by acute kidney injury, leg edema and pain.  #Bilateral lower extremity edema, multifactorial  Continues to make improvement. Denies SHOB. Swelling has improved with unna boot wraps - Unna boots - Continue Furosemide 80 mg BID will d/c on home dose - HH diet, 1200 mL fluid restriction and strict I&O  #T2DM Controlled on current regimen -Continue Lantus 150 BID and SSI -  Hgb A1c9.1  #Cirrhosis #Dysplasic Liverlesions Patient is well compensated without stigmata for cirrhosis. Will need OP follow up with GI.  - AFP pending  #Possible AKI vs CKD Renal function stable today. Cr of 1.64 and GFR of 43 - Continue to monitor daily with CMP  Thrombocytopenia: Platelet count stable. -Transfusion goals less than 50 with concern for bleeding and less than 20 otherwise.  #BPH  - Continue home Flomax - pending post void residual volume  Diet: Carb-Modified IVF: None,None VTE: Enoxaparin Code: Full PT/OT recs: Home Health, none. TOC recs: none   Dispo: Anticipated discharge to Home today   Marianna Payment, [place], PGY-2 Date 06/08/2020 Time 5:14 AM Pager: [phone] Please contact the on call pager after 5 pm and on weekends at [phone].

## 2020-06-09 ENCOUNTER — Inpatient Hospital Stay (HOSPITAL_COMMUNITY): Payer: Medicare HMO

## 2020-06-09 LAB — COMPREHENSIVE METABOLIC PANEL
ALT: 74 U/L — ABNORMAL HIGH (ref 0–44)
AST: 82 U/L — ABNORMAL HIGH (ref 15–41)
Albumin: 2.8 g/dL — ABNORMAL LOW (ref 3.5–5.0)
Alkaline Phosphatase: 139 U/L — ABNORMAL HIGH (ref 38–126)
Anion gap: 11 (ref 5–15)
BUN: 54 mg/dL — ABNORMAL HIGH (ref 8–23)
CO2: 23 mmol/L (ref 22–32)
Calcium: 8.9 mg/dL (ref 8.9–10.3)
Chloride: 103 mmol/L (ref 98–111)
Creatinine, Ser: 1.58 mg/dL — ABNORMAL HIGH (ref 0.61–1.24)
GFR calc Af Amer: 52 mL/min — ABNORMAL LOW (ref >60)
GFR calc non Af Amer: 45 mL/min — ABNORMAL LOW (ref >60)
Glucose, Bld: 193 mg/dL — ABNORMAL HIGH (ref 70–99)
Potassium: 4.3 mmol/L (ref 3.5–5.1)
Sodium: 137 mmol/L (ref 135–145)
Total Bilirubin: 1 mg/dL (ref 0.3–1.2)
Total Protein: 6.8 g/dL (ref 6.5–8.1)

## 2020-06-09 LAB — CBC WITH DIFFERENTIAL/PLATELET
Abs Immature Granulocytes: 0.07 10*3/uL (ref 0.00–0.07)
Basophils Absolute: 0 10*3/uL (ref 0.0–0.1)
Basophils Relative: 0 %
Eosinophils Absolute: 0.1 10*3/uL (ref 0.0–0.5)
Eosinophils Relative: 1 %
HCT: 27.8 % — ABNORMAL LOW (ref 39.0–52.0)
Hemoglobin: 8.9 g/dL — ABNORMAL LOW (ref 13.0–17.0)
Immature Granulocytes: 1 %
Lymphocytes Relative: 11 %
Lymphs Abs: 0.8 10*3/uL (ref 0.7–4.0)
MCH: 30 pg (ref 26.0–34.0)
MCHC: 32 g/dL (ref 30.0–36.0)
MCV: 93.6 fL (ref 80.0–100.0)
Monocytes Absolute: 0.5 10*3/uL (ref 0.1–1.0)
Monocytes Relative: 7 %
Neutro Abs: 5.7 10*3/uL (ref 1.7–7.7)
Neutrophils Relative %: 80 %
Platelets: 161 10*3/uL (ref 150–400)
RBC: 2.97 MIL/uL — ABNORMAL LOW (ref 4.22–5.81)
RDW: 14.8 % (ref 11.5–15.5)
WBC: 7.3 10*3/uL (ref 4.0–10.5)
nRBC: 0 % (ref 0.0–0.2)

## 2020-06-09 LAB — CBC
HCT: 28.1 % — ABNORMAL LOW (ref 39.0–52.0)
Hemoglobin: 9.1 g/dL — ABNORMAL LOW (ref 13.0–17.0)
MCH: 30.4 pg (ref 26.0–34.0)
MCHC: 32.4 g/dL (ref 30.0–36.0)
MCV: 94 fL (ref 80.0–100.0)
Platelets: 149 10*3/uL — ABNORMAL LOW (ref 150–400)
RBC: 2.99 MIL/uL — ABNORMAL LOW (ref 4.22–5.81)
RDW: 14.8 % (ref 11.5–15.5)
WBC: 7.2 10*3/uL (ref 4.0–10.5)
nRBC: 0 % (ref 0.0–0.2)

## 2020-06-09 LAB — RESPIRATORY PANEL BY PCR

## 2020-06-09 LAB — GLUCOSE, CAPILLARY
Glucose-Capillary: 115 mg/dL — ABNORMAL HIGH (ref 70–99)
Glucose-Capillary: 128 mg/dL — ABNORMAL HIGH (ref 70–99)
Glucose-Capillary: 174 mg/dL — ABNORMAL HIGH (ref 70–99)
Glucose-Capillary: 243 mg/dL — ABNORMAL HIGH (ref 70–99)
Glucose-Capillary: 275 mg/dL — ABNORMAL HIGH (ref 70–99)
Glucose-Capillary: 277 mg/dL — ABNORMAL HIGH (ref 70–99)

## 2020-06-09 LAB — SARS CORONAVIRUS 2 BY RT PCR (HOSPITAL ORDER, PERFORMED IN ~~LOC~~ HOSPITAL LAB): SARS Coronavirus 2: NEGATIVE

## 2020-06-09 IMAGING — CT CT CHEST W/O CM
2 of 3 series · 15 of 36 positions shown, 18 images · non-contrast
Comparison: [DATE]

CLINICAL DATA: Persistent cough.

EXAM:
CT CHEST WITHOUT CONTRAST
TECHNIQUE: Multidetector CT imaging of the chest was performed following the
standard protocol without IV contrast.

[Series 3: chest w/o 2mm st · axial · non-contrast · 0.98mm/px · z∈[-219,+93]mm · 12 of 184 slices shown, 15 images]
[im 14/184  mediastinal]
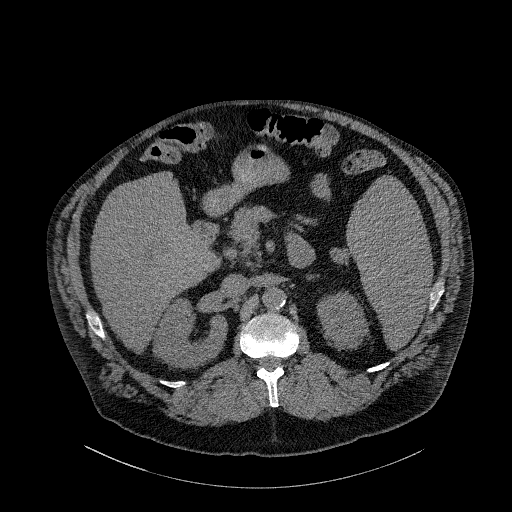
[im 14/184  lung]
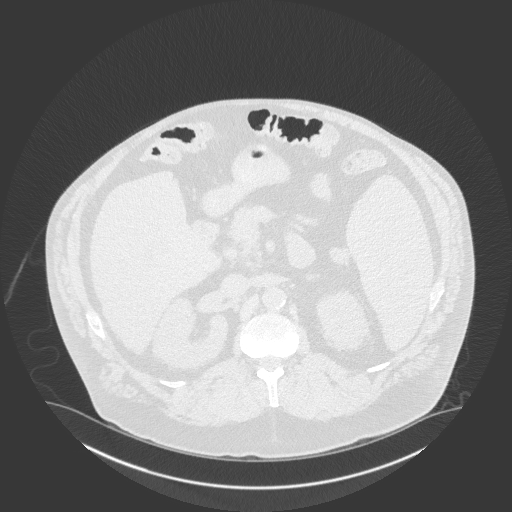
[im 28/184  lung]
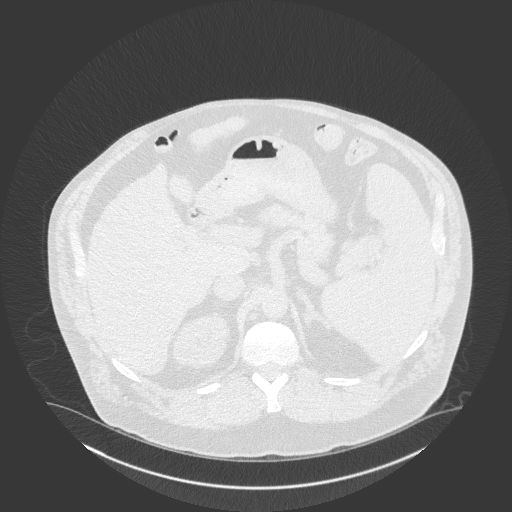
[im 41/184  lung]
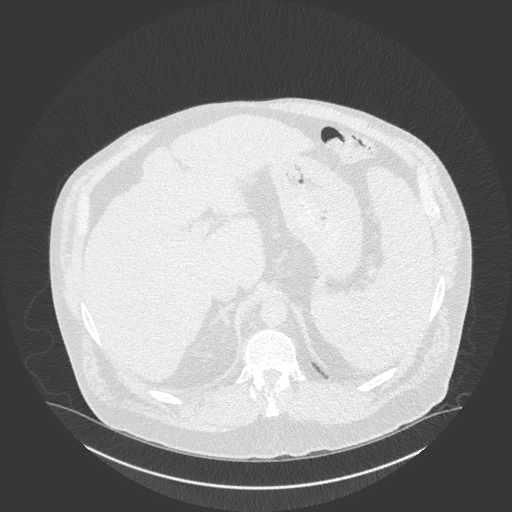
[im 55/184  lung]
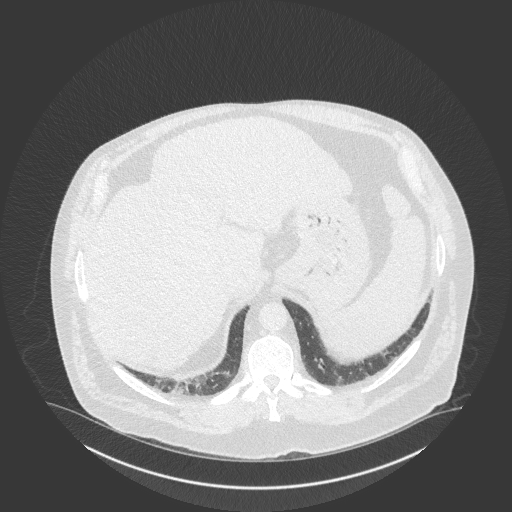
[im 68/184  mediastinal]
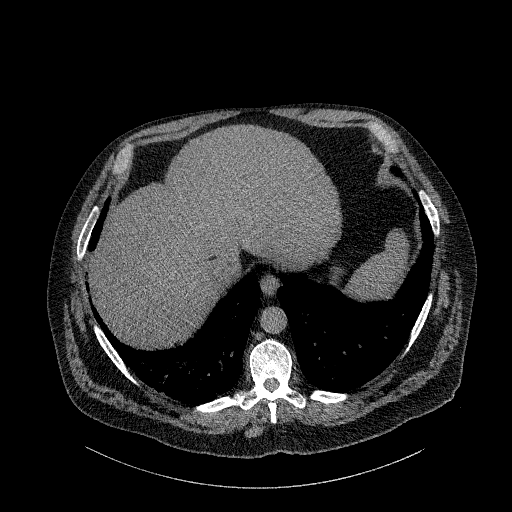
[im 68/184  lung]
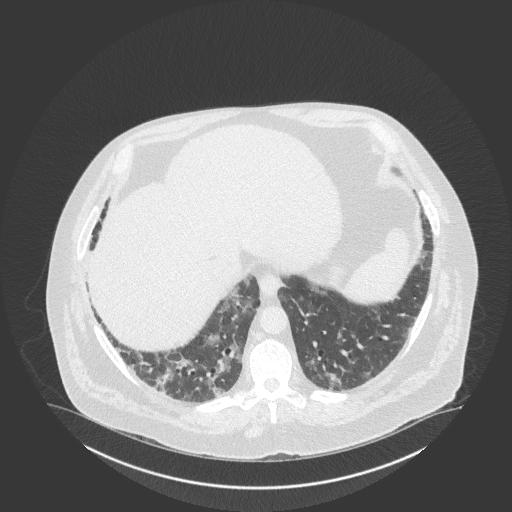
[im 82/184  lung]
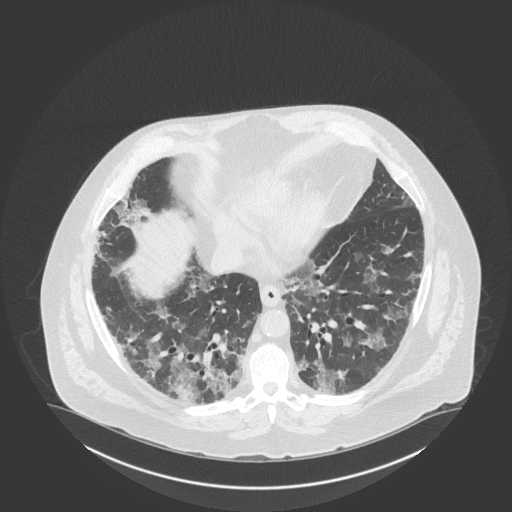
[im 102/184  lung]
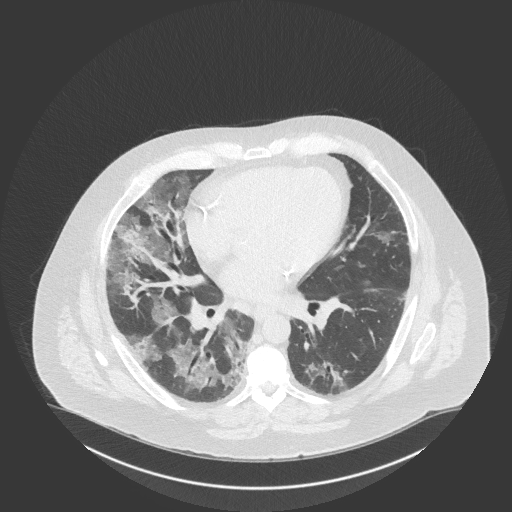
[im 116/184  lung]
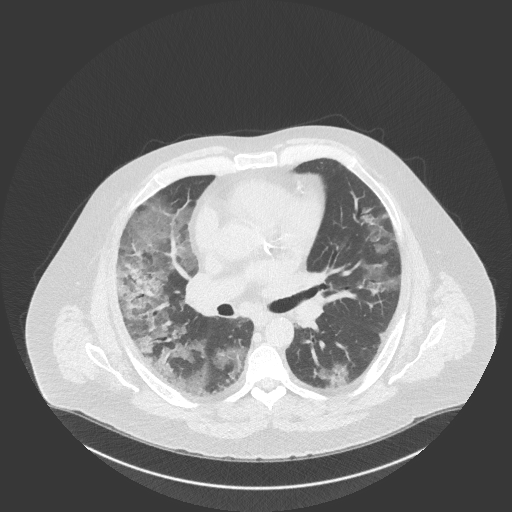
[im 129/184  mediastinal]
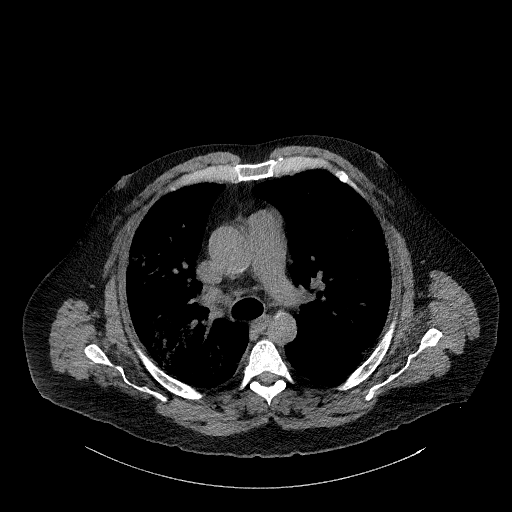
[im 129/184  lung]
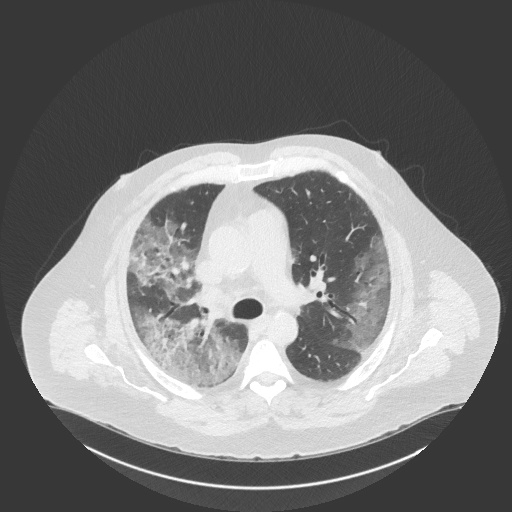
[im 143/184  lung]
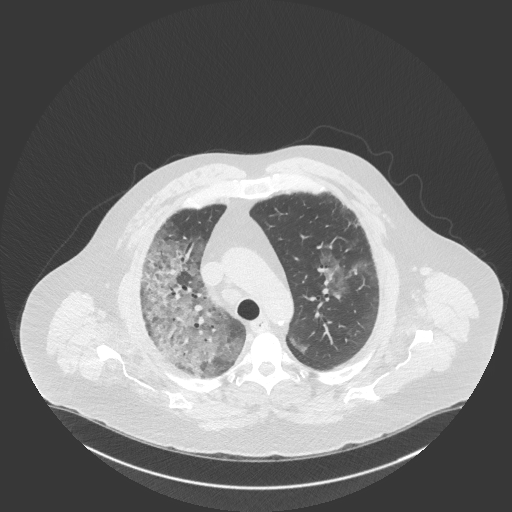
[im 156/184  lung]
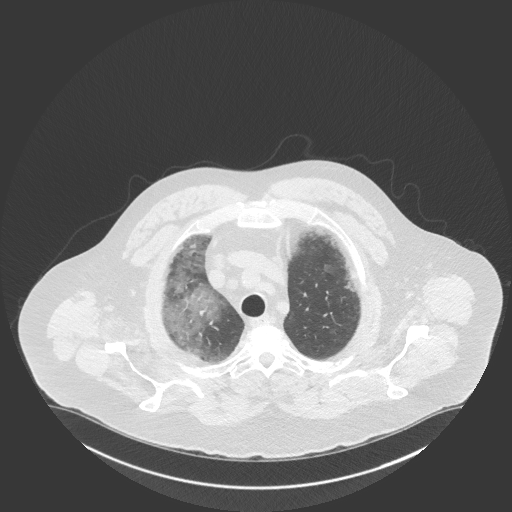
[im 170/184  lung]
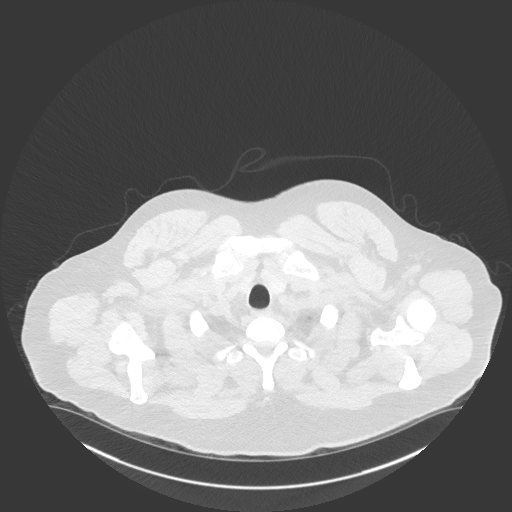

[Series 6: chest w/o 2mm st cor · coronal · non-contrast · 0.72mm/px · 3 of 171 slices shown]
[im 35/171  lung]
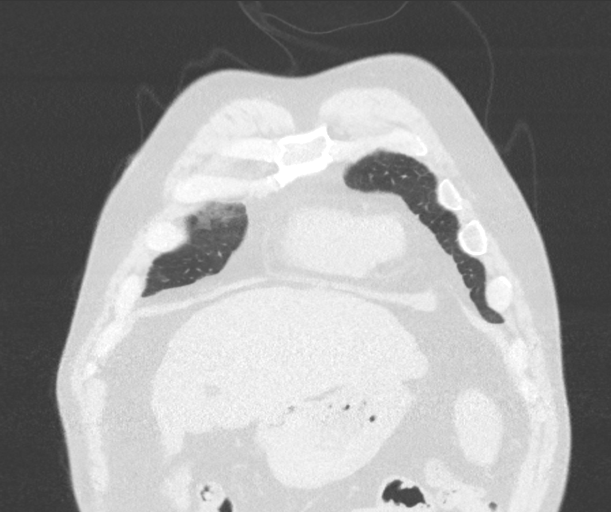
[im 69/171  lung]
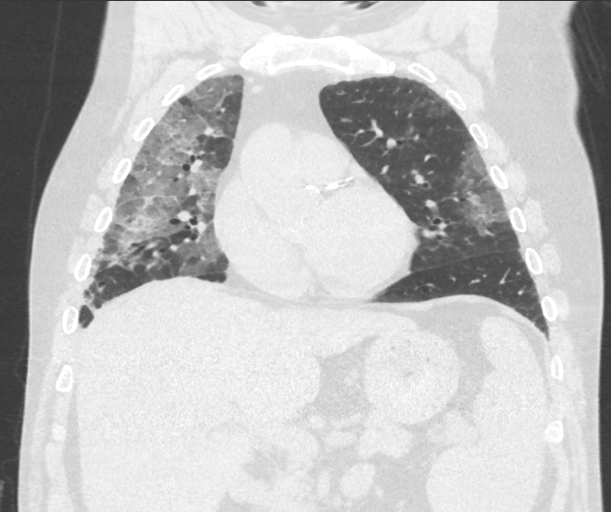
[im 103/171  lung]
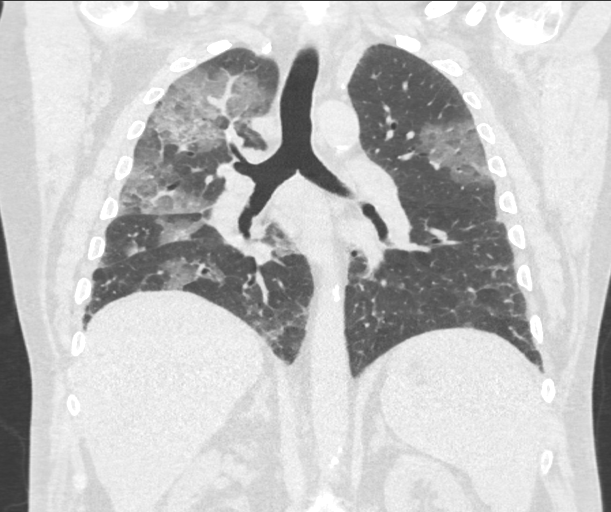

[15 of 36 positions shown; findings below may reference images not displayed]

FINDINGS: Cardiovascular: There is mild to moderate severity calcification of
the aortic arch. No significant vascular findings. Normal heart size
with marked severity coronary artery calcification. No pericardial
effusion.

Mediastinum/Nodes: There is mild pretracheal lymphadenopathy.
Thyroid gland, trachea, and esophagus demonstrate no significant
findings.

Lungs/Pleura: Marked severity bilateral multifocal infiltrates are
seen.

There is no evidence of a pleural effusion or pneumothorax.

Upper Abdomen: The liver is cirrhotic in appearance.

The spleen is moderately enlarged.

Musculoskeletal: No chest wall mass or suspicious bone lesions
identified.
IMPRESSION: 1. Marked severity bilateral multifocal infiltrates.
2. Marked severity coronary artery disease.
3. Cirrhosis and splenomegaly.
4. Aortic atherosclerosis.

Aortic Atherosclerosis ([7N]-[7N]).

## 2020-06-09 MED ORDER — MENTHOL 3 MG MT LOZG
1.0000 | LOZENGE | OROMUCOSAL | Status: DC | PRN
  Filled 2020-06-09: qty 9

## 2020-06-09 NOTE — Progress Notes (Addendum)
HD#8 Subjective:  Overnight Events: No overnight events.  Patient States that he is doing well. He continues to require some oxygen, but denies SHOB. Continues to have productive cough with yellow sputum. He feels like many of his symptoms are resolving. He continues to have LE edema, but the Unna boots are helping him.   Objective:  Vital signs in last 24 hours: Vitals:   06/08/20 2009 06/09/20 0007 06/09/20 0358 06/09/20 0544  BP: 124/69 103/80 (!) 123/56   Pulse: 80 84 82   Resp: 18 20 20    Temp: 98.6 F (37 C) 99.4 F (37.4 C) 98.7 F (37.1 C)   TempSrc: Oral Oral Oral   SpO2: 91% 92% 94%   Weight:    120.5 kg  Height:       Supplemental O2: RA to 2L [place] SpO2: 94 % O2 Flow Rate (L/min): 2 L/min   Physical Exam:  Physical Exam Constitutional:      Appearance: Normal appearance. He is obese.  HENT:     Head: Normocephalic and atraumatic.  Eyes:     Extraocular Movements: Extraocular movements intact.  Cardiovascular:     Rate and Rhythm: Normal rate.     Pulses: Normal pulses.     Heart sounds: Normal heart sounds.  Pulmonary:     Effort: Pulmonary effort is normal.     Comments: Diminished lung sounds on the left no rales or rhonchi Abdominal:     General: There is no distension.     Palpations: Abdomen is soft.  Musculoskeletal:        General: Swelling present. Normal range of motion.     Cervical back: Normal range of motion.     Right lower leg: Edema present.     Left lower leg: Edema present.     Comments: Unna boots in place. Still has 1+ edema bilaterally R elbow pain with ROM with small area of effusion  Neurological:     General: No focal deficit present.     Mental Status: He is alert.  Psychiatric:        Mood and Affect: Mood normal.     Filed Weights   06/07/20 0400 06/08/20 0050 06/09/20 0544  Weight: 122.8 kg 122.2 kg 120.5 kg     Intake/Output Summary (Last 24 hours) at 06/09/2020 0733 Last data filed at 06/09/2020 0359 Gross  per 24 hour  Intake 1522 ml  Output 2500 ml  Net -978 ml   Net IO Since Admission: -3,500.43 mL [06/09/20 0733]  Pertinent Labs: CBC Latest Ref Rng & Units 06/08/2020 06/07/2020 06/06/2020  WBC 4.0 - 10.5 K/uL 7.3 6.9 6.1  Hemoglobin 13.0 - 17.0 g/dL 8.0(L) 8.7(L) 8.2(L)  Hematocrit 39 - 52 % 25.5(L) 26.8(L) 25.1(L)  Platelets 150 - 400 K/uL 123(L) 116(L) 104(L)    CMP Latest Ref Rng & Units 06/08/2020 06/07/2020 06/06/2020  Glucose 70 - 99 mg/dL 132(H) 121(H) 149(H)  BUN 8 - 23 mg/dL 55(H) 48(H) 47(H)  Creatinine 0.61 - 1.24 mg/dL 1.64(H) 1.61(H) 1.44(H)  Sodium 135 - 145 mmol/L 133(L) 134(L) 134(L)  Potassium 3.5 - 5.1 mmol/L 4.4 4.5 4.2  Chloride 98 - 111 mmol/L 101 102 104  CO2 22 - 32 mmol/L 22 20(L) 21(L)  Calcium 8.9 - 10.3 mg/dL 8.6(L) 8.6(L) 8.8(L)  Total Protein 6.5 - 8.1 g/dL 6.7 6.6 -  Total Bilirubin 0.3 - 1.2 mg/dL 1.1 1.2 -  Alkaline Phos 38 - 126 U/L 110 106 -  AST 15 -  41 U/L 69(H) 85(H) -  ALT 0 - 44 U/L 64(H) 64(H) -   AFP 11.3  Pending Labs: none  Imaging: DG CHEST PORT 1 VIEW  Result Date: 06/08/2020 CLINICAL DATA:  Shortness of breath, lower extremity swelling for several months, hypoxia EXAM: PORTABLE CHEST 1 VIEW COMPARISON:  Portable exam 1754 hours compared to 06/01/2020 FINDINGS: Enlargement of cardiac silhouette. Mediastinal contours and pulmonary vascularity normal. Patchy airspace infiltrates in the periphery of both lungs RIGHT greater than LEFT favor multifocal pneumonia, increased since prior exam. No pleural effusion or pneumothorax. Osseous structures unremarkable. IMPRESSION: In both lungs RIGHT greater than LEFT, peripheral predominance, favor multifocal pneumonia. Electronically Signed   By: Lavonia Dana M.D.   On: 06/08/2020 18:53    Assessment/Plan:   Active Problems:   Pancytopenia ([place])   Hypertension   Diabetes ([place])   Stage 3 chronic kidney disease   CAP (community acquired pneumonia)   Effusion of lower leg joint   AKI (acute  kidney injury) ([place])  Patient Summary: Nicholas Rice a 64 y.o.with pertinent PMH of CKD stage 3, HTN, T2DM, Anemia, GERD, BPH, HLD, CAD (previously told he has angina)HFpEF (EF 60%)who presented with a 1 month history of shortness of breath progressively got worse over the last weekand admitted for community-acquired pneumonia complicated by worsening multifocal lobar infiltrates, acute kidney injury, leg edema and pain.  #Worsening bilateral chest infiltrates O2 desaturationwith ambulation to 91% yesterday requiring 2L. Repeat CXR with R>L peripheral infiltrates. Completed 5 days of antibiotics on 06/05/2020. Likely viral etiology. Remains afebrile with stable WBC of 7.2. Currently on room air with saturating around 90%. - CT chest today - Wean O2 as tolerated  #Bilateral lower extremity edema, multifactorial  #HFpEF Continues to make improvement. Denies SHOB. Swelling has improved with unna boot wraps. - Unna boots - Continue Furosemide 80 mg BID will d/c on home dose - HH diet, 1200 mL fluid restriction and strict I&O  #T2DM Controlled on current regimen -Continue Lantus 150 BID and SSI - Hgb A1c9.1  #Cirrhosis #Dysplasic Liverlesions Patient is well compensated without stigmata for cirrhosis. Will need OP follow up with GI.  - AFP mildly elevated 11.3 - Discussed patient with GI PA Azucena Freed, recommending patient follow up as an outpatient  #Possible AKI vs CKD Renal function stable today. Cr of 1.58  and GFR of 45 - Continue to monitor daily with CMP  Thrombocytopenia: Platelet count stable. -Transfusion goals less than 50 with concern for bleeding and less than 20 otherwise.  #BPH  - Continue home Flomax - pending post void residual volume  Diet: Carb-Modified IVF: None,None VTE: Enoxaparin Code: Full PT/OT recs: Home Health, none. TOC recs: none   Dispo: Anticipated discharge in 1 day   Iona Buzzell, MD Date 06/09/2020 Time 7:33 AM Pager:  [phone] Please contact the on call pager after 5 pm and on weekends at [phone].

## 2020-06-09 NOTE — Progress Notes (Signed)
Inpatient Diabetes Program Recommendations  AACE/ADA: New Consensus Statement on Inpatient Glycemic Control  Target Ranges:  Prepandial:   less than 140 mg/dL      Peak postprandial:   less than 180 mg/dL (1-2 hours)      Critically ill patients:  140 - 180 mg/dL  Results for RYKIN, ROUTE (MRN [id number]) as of 06/09/2020 10:55  Ref. Range 06/09/2020 00:04 06/09/2020 03:53 06/09/2020 07:50  Glucose-Capillary Latest Ref Range: 70 - 99 mg/dL 275 (H)  Novolog 11 units  Lantus 150 units@23 :20 243 (H)  Novolog 7 units 128 (H)  Novolog 3 units  Lantus 150 units@9 :36   Results for PRIYANSH, PRY (MRN [id number]) as of 06/09/2020 10:55  Ref. Range 06/08/2020 07:37 06/08/2020 11:26 06/08/2020 16:25 06/08/2020 19:59  Glucose-Capillary Latest Ref Range: 70 - 99 mg/dL 131 (H)  Novolog 3 units  Lantus 150 units 119 (H) 239 (H)  Novolog 7 units 337 (H)  Novolog 11 units   Review of Glycemic Control  Diabetes history: DM2 Outpatient Diabetes medications: Glipizide XL 10 mg BID, NPH 140 units TID (in am, at noon, at bedtime), Metformin 850 mg TID, Toujeo 50 unit QHS Current orders for Inpatient glycemic control: Lantus 150 units BID, Novolog 0-20 units Q4H  Inpatient Diabetes Program Recommendations:    Insulin: Please consider increasing Lantus to 155 units BID  Thanks, Barnie Alderman, RN, MSN, CDE Diabetes Coordinator Inpatient Diabetes Program [phone] (Team Pager from 8am to 5pm)

## 2020-06-09 NOTE — Progress Notes (Signed)
Orthopedic Tech Progress Note Patient Details:  Nicholas Rice 1955/12/22 [id number]  Ortho Devices Type of Ortho Device: Haematologist Ortho Device/Splint Location: BLE Ortho Device/Splint Interventions: Ordered, Application   Post Interventions Patient Tolerated: Well Instructions Provided: Care of device   Janit Pagan 06/09/2020, 2:52 PM

## 2020-06-09 NOTE — TOC Transition Note (Addendum)
Transition of Care [facility]) - CM/SW Discharge Note   Patient Details  Name: Nicholas Rice MRN: [id number] Date of Birth: August 24, 1956  [facility] CM/SW Contact:  Zenon Mayo, RN Phone Number: 06/09/2020, 9:46 AM   Clinical Narrative:    Patient is for possible dc today, NCM offered choice, he states he has no preference, NCM made referral to [place] with [facility], awaiting call back to see if she can take referral for [place], [place].  He will also need a rolling walker and home oxygen.  Patient states he is ok with [employer] supplying the DME for him.  NCM made referral to [facility] with [employer].  NCM notified MD to put orders in for oxygen and [place] services.  15:36 Per Tiffany with [facility] , they are able to take referral for [place], [place]. Will need orders.    Final next level of care: [place] Barriers to Discharge: No Barriers Identified   Patient Goals and CMS Choice Patient states their goals for this hospitalization and ongoing recovery are:: get better CMS Medicare.gov Compare Post Acute Care list provided to:: Patient Choice offered to / list presented to : Patient  Discharge Placement                       Discharge Plan and Services                DME Arranged: Walker rolling DME Agency: [employer] Date DME Agency Contacted: 06/09/20 Time DME Agency Contacted: [phone] Representative spoke with at DME Agency: [place]: RN, PT [place] Agency: [facility] [employer] Date [place]: 06/09/20 Time [place]: [phone] Representative spoke with at [place]: [place]) Interventions     Readmission Risk Interventions No flowsheet data found.

## 2020-06-09 NOTE — Plan of Care (Signed)
  Problem: Safety: Goal: Ability to remain free from injury will improve Outcome: Progressing   

## 2020-06-10 ENCOUNTER — Inpatient Hospital Stay (HOSPITAL_COMMUNITY): Payer: Medicare HMO

## 2020-06-10 DIAGNOSIS — K7460 Unspecified cirrhosis of liver: Secondary | ICD-10-CM

## 2020-06-10 DIAGNOSIS — M19021 Primary osteoarthritis, right elbow: Secondary | ICD-10-CM | POA: Diagnosis not present

## 2020-06-10 HISTORY — DX: Unspecified cirrhosis of liver: K74.60

## 2020-06-10 LAB — COMPREHENSIVE METABOLIC PANEL
ALT: 79 U/L — ABNORMAL HIGH (ref 0–44)
AST: 79 U/L — ABNORMAL HIGH (ref 15–41)
Albumin: 2.8 g/dL — ABNORMAL LOW (ref 3.5–5.0)
Alkaline Phosphatase: 148 U/L — ABNORMAL HIGH (ref 38–126)
Anion gap: 11 (ref 5–15)
BUN: 53 mg/dL — ABNORMAL HIGH (ref 8–23)
CO2: 24 mmol/L (ref 22–32)
Calcium: 9 mg/dL (ref 8.9–10.3)
Chloride: 100 mmol/L (ref 98–111)
Creatinine, Ser: 1.46 mg/dL — ABNORMAL HIGH (ref 0.61–1.24)
GFR calc Af Amer: 58 mL/min — ABNORMAL LOW (ref >60)
GFR calc non Af Amer: 50 mL/min — ABNORMAL LOW (ref >60)
Glucose, Bld: 239 mg/dL — ABNORMAL HIGH (ref 70–99)
Potassium: 4.1 mmol/L (ref 3.5–5.1)
Sodium: 135 mmol/L (ref 135–145)
Total Bilirubin: 1.1 mg/dL (ref 0.3–1.2)
Total Protein: 7 g/dL (ref 6.5–8.1)

## 2020-06-10 LAB — GLUCOSE, CAPILLARY
Glucose-Capillary: 239 mg/dL — ABNORMAL HIGH (ref 70–99)
Glucose-Capillary: 169 mg/dL — ABNORMAL HIGH (ref 70–99)
Glucose-Capillary: 98 mg/dL (ref 70–99)
Glucose-Capillary: 177 mg/dL — ABNORMAL HIGH (ref 70–99)

## 2020-06-10 LAB — PROCALCITONIN: Procalcitonin: 0.19 ng/mL

## 2020-06-10 IMAGING — DX DG ELBOW 2V*R*
2 series · 2 of 2 positions shown · non-contrast
Comparison: None

CLINICAL DATA: Right elbow pain.

EXAM:
RIGHT ELBOW - 2 VIEW

[elbow ap]
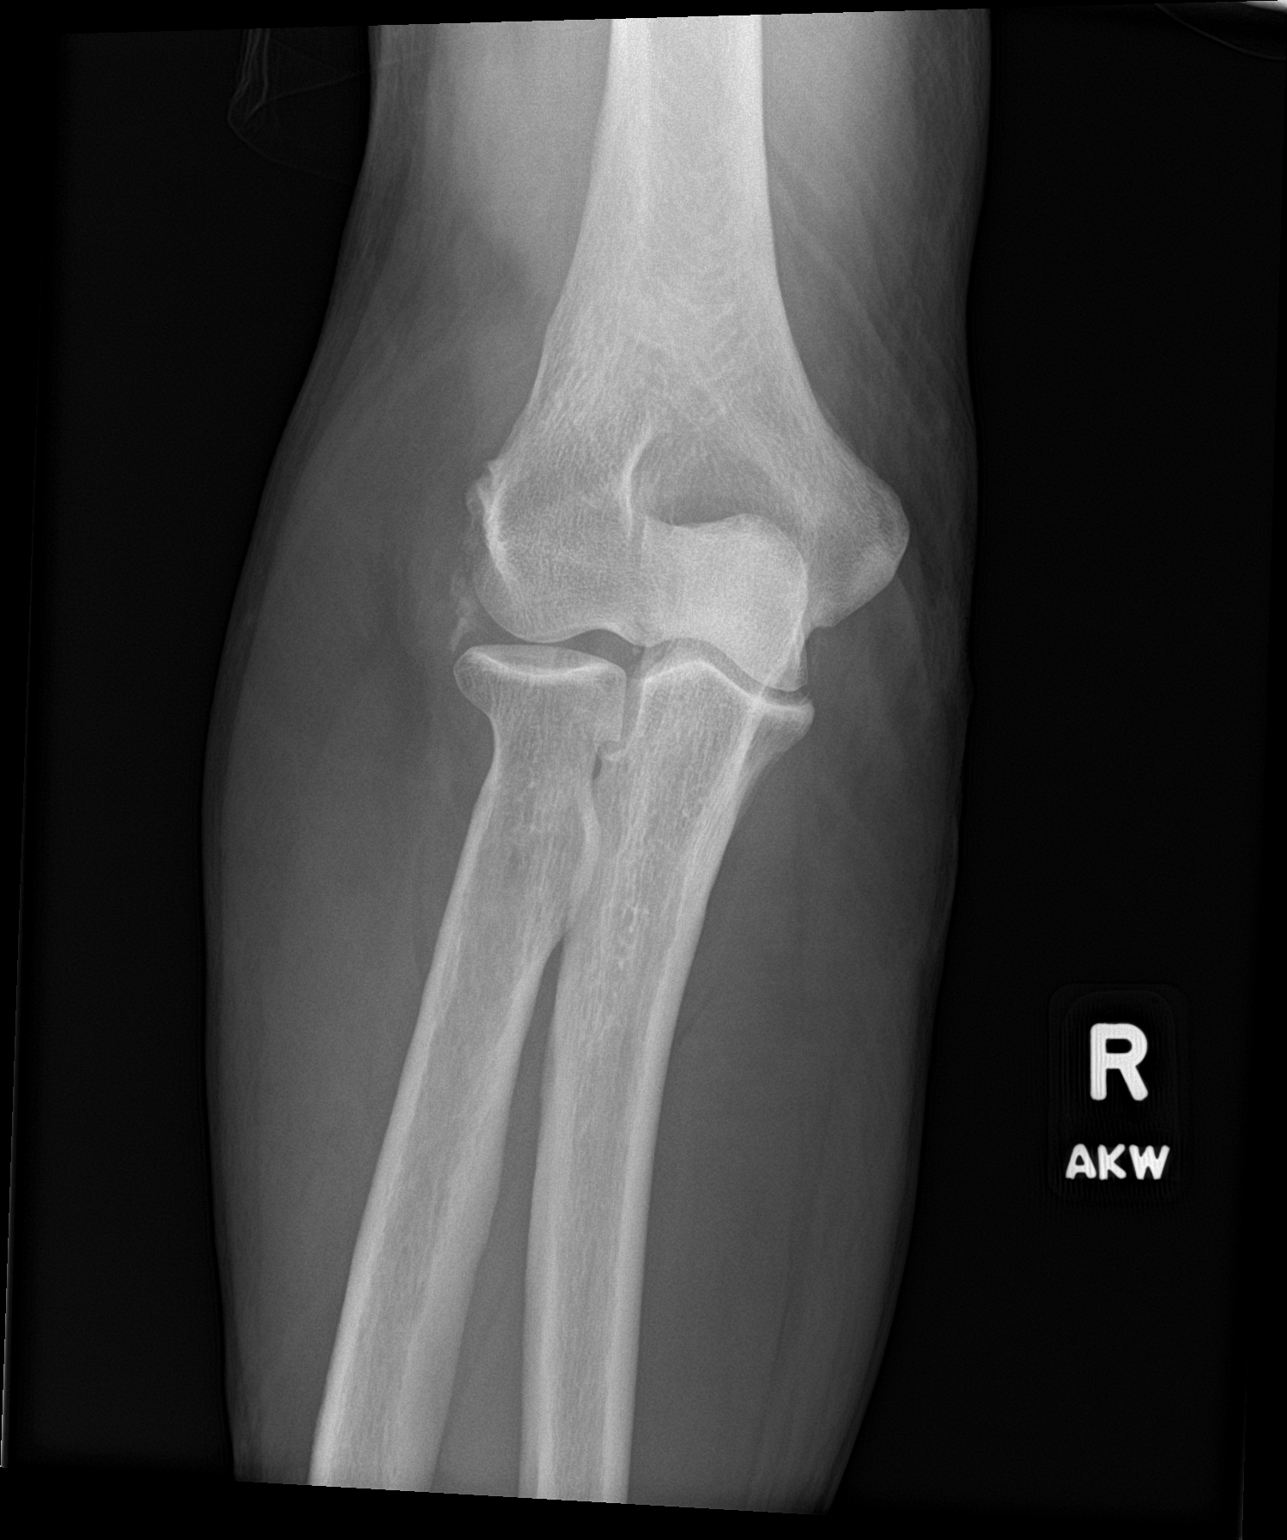

[elbow lat]
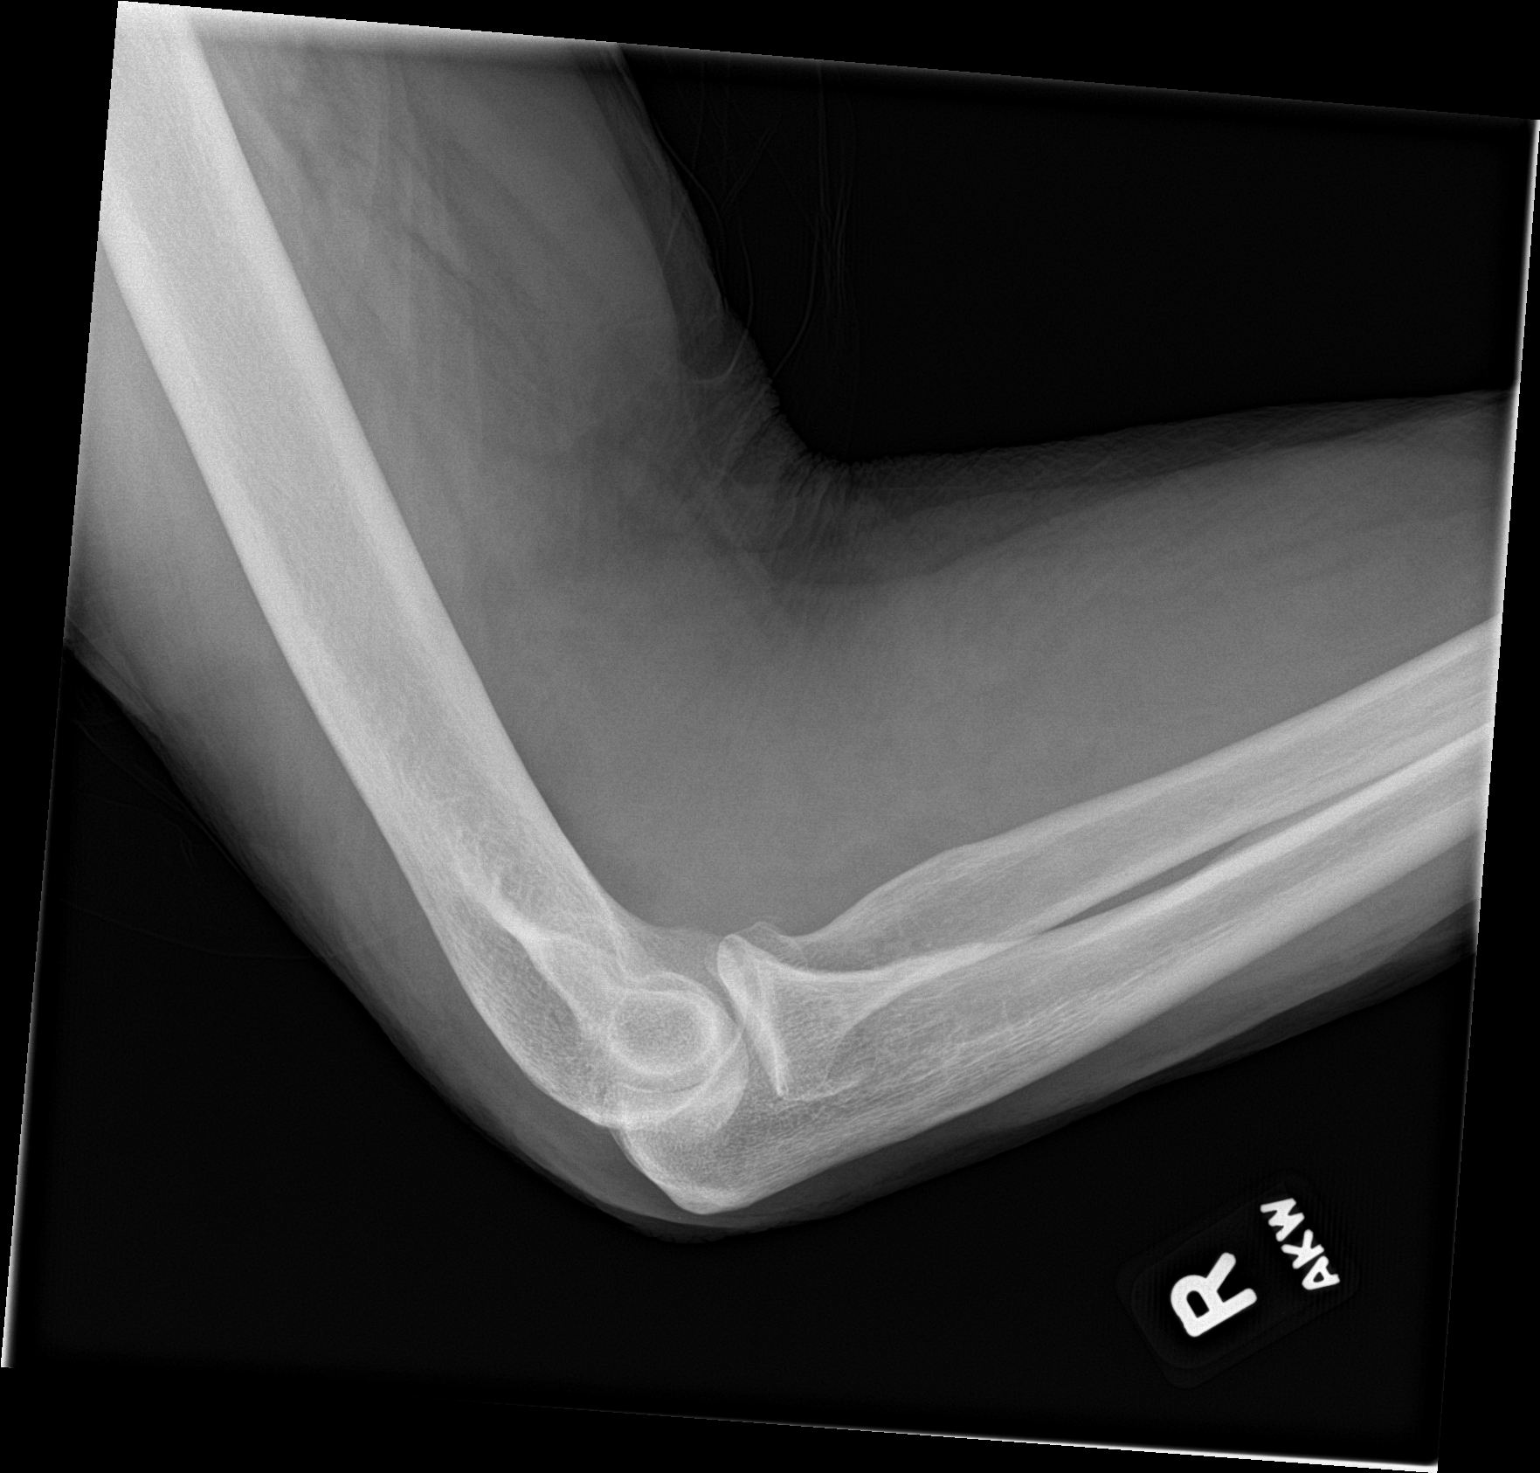

[2 of 2 positions shown; findings below may reference images not displayed]

FINDINGS: Mild elbow joint degenerative changes but no acute bony abnormality
or joint effusion. Calcification noted along the radial aspect of
the elbow joint appears extra-articular. It could be related to
prior radial collateral ligament injury or calcific extensor
tendinopathy.
IMPRESSION: 1. Mild elbow joint degenerative changes but no acute bony findings
or joint effusion.
2. Extra-articular calcification along the radial aspect of the
joint as discussed above.

## 2020-06-10 MED ORDER — ALBUTEROL SULFATE (2.5 MG/3ML) 0.083% IN NEBU: 2.5000 mg | INHALATION_SOLUTION | RESPIRATORY_TRACT | 2 refills | Status: AC | PRN

## 2020-06-10 MED ORDER — IPRATROPIUM-ALBUTEROL 0.5-2.5 (3) MG/3ML IN SOLN
3.0000 mL | RESPIRATORY_TRACT | Status: DC
  Administered 2020-06-10 (×2): 3 mL via RESPIRATORY_TRACT
  Filled 2020-06-10 (×2): qty 3

## 2020-06-10 MED ORDER — INSULIN GLARGINE 100 UNIT/ML ~~LOC~~ SOLN: 160.0000 [IU] | Freq: Two times a day (BID) | SUBCUTANEOUS | 11 refills | Status: DC

## 2020-06-10 MED ORDER — PREDNISONE 10 MG PO TABS: 30.0000 mg | ORAL_TABLET | Freq: Every day | ORAL | 0 refills | Status: AC

## 2020-06-10 NOTE — Progress Notes (Signed)
Physical Therapy Treatment Patient Details Name: Nicholas Rice MRN: [id number] DOB: 23-Apr-1956 Today's Date: 06/10/2020    History of Present Illness Nicholas Rice is a 64 y.o. male with a pertinent PMH of CKD stage 3, HTN, T2DM, Anemia, GERD, BPH, HLD, CAD who presented with a 1 month history SOB. Chest x-ray significant for multifocal PNA. Pt also with L knee and R elbow swelling and pain - reports hx gout.    PT Comments    Pt making excellent progress.  He was able to ambulate 300' with RW and supervision with min cues for RW use.  His VSS throughout treatment.  Additionally, demonstrated decreased steppage gait - reports edema has decreased and this could have contributed to improved L ankle dorsiflexion.  Cont POC to advance to his baseline.    Follow Up Recommendations  Home health PT;Supervision - Intermittent     Equipment Recommendations  Rolling walker with 5" wheels    Recommendations for Other Services       Precautions / Restrictions Precautions Precautions: None    Mobility  Bed Mobility Overal bed mobility: Independent                Transfers Overall transfer level: Needs assistance Equipment used: Rolling walker (2 wheeled) Transfers: Sit to/from Stand Sit to Stand: Supervision            Ambulation/Gait Ambulation/Gait assistance: Min guard;Supervision Gait Distance (Feet): 300 Feet Assistive device: Rolling walker (2 wheeled) Gait Pattern/deviations: Step-through pattern Gait velocity: decreased   General Gait Details: Ambulated with min guard progressed to supervision.  Pt with improved foot clearance today without steppage gait - reports less edema.  Did need cues for posture and RW proximity   Stairs             Wheelchair Mobility    Modified Rankin (Stroke Patients Only)       Balance Overall balance assessment: Needs assistance Sitting-balance support: No upper extremity supported;Feet supported Sitting balance-Leahy  Scale: Normal     Standing balance support: No upper extremity supported;Bilateral upper extremity supported Standing balance-Leahy Scale: Fair Standing balance comment: static with UE support; RW for walking                            Cognition Arousal/Alertness: Awake/alert Behavior During Therapy: WFL for tasks assessed/performed Overall Cognitive Status: Within Functional Limits for tasks assessed                                        Exercises      General Comments General comments (skin integrity, edema, etc.): SpO2 92% on RA with ambulation.      Pertinent Vitals/Pain Pain Assessment: Faces Faces Pain Scale: Hurts even more Pain Location: R elbow (reports gout) Pain Descriptors / Indicators: Sore;Grimacing Pain Intervention(s): Limited activity within patient's tolerance    Home Living                      Prior Function            PT Goals (current goals can now be found in the care plan section) Acute Rehab PT Goals Patient Stated Goal: return home PT Goal Formulation: With patient Time For Goal Achievement: 06/21/20 Potential to Achieve Goals: Good Progress towards PT goals: Progressing toward goals    Frequency  Min 3X/week      PT Plan Current plan remains appropriate    Co-evaluation              AM-PAC PT "6 Clicks" Mobility   Outcome Measure  Help needed turning from your back to your side while in a flat bed without using bedrails?: None Help needed moving from lying on your back to sitting on the side of a flat bed without using bedrails?: None Help needed moving to and from a bed to a chair (including a wheelchair)?: None Help needed standing up from a chair using your arms (e.g., wheelchair or bedside chair)?: None Help needed to walk in hospital room?: None Help needed climbing 3-5 steps with a railing? : A Little 6 Click Score: 23    End of Session Equipment Utilized During Treatment:  Gait belt Activity Tolerance: Patient tolerated treatment well Patient left: in chair;with call bell/phone within reach Nurse Communication: Mobility status PT Visit Diagnosis: Unsteadiness on feet (R26.81);Muscle weakness (generalized) (M62.81);Difficulty in walking, not elsewhere classified (R26.2);Pain Pain - part of body: Ankle and joints of foot;Knee     Time: 3976-7341 PT Time Calculation (min) (ACUTE ONLY): 20 min  Charges:  $Gait Training: 8-22 mins                     Abran Richard, PT Acute Rehab Services Pager [phone] [facility] 06/10/2020, 1:05 PM

## 2020-06-10 NOTE — Care Management Important Message (Signed)
Important Message  Patient Details  Name: Nicholas Rice MRN: [id number] Date of Birth: July 13, 1956   Medicare Important Message Given:  Yes     Shelda Altes 06/10/2020, 9:44 AM

## 2020-06-10 NOTE — Discharge Summary (Signed)
Name: Nicholas Rice MRN: [id number] DOB: August 22, 1956 64 y.o. PCP: Marianna Payment, MD  Date of Admission: 06/01/2020 10:23 AM Date of Discharge:  Attending Physician: Angelica Pou, MD  Discharge Diagnosis: 1. Atypical pneumonia 2. Lower extremity edema 3. Gout 4. Liver cirrhosis with liver lesions  Discharge Medications: Allergies as of 06/10/2020      Reactions   Loratadine Rash      Medication List    STOP taking these medications   aspirin EC 81 MG tablet   glipiZIDE 10 MG 24 hr tablet Commonly known as: GLUCOTROL XL   insulin NPH Human 100 UNIT/ML injection Commonly known as: NOVOLIN N   metFORMIN 850 MG tablet Commonly known as: GLUCOPHAGE   Toujeo Max SoloStar 300 UNIT/ML Solostar Pen Generic drug: insulin glargine (2 Unit Dial) Replaced by: insulin glargine 100 UNIT/ML injection     TAKE these medications   Accu-Chek Guide test strip Generic drug: glucose blood   Accu-Chek Guide w/Device Kit   Accu-Chek Softclix Lancets lancets   albuterol (2.5 MG/3ML) 0.083% nebulizer solution Commonly known as: PROVENTIL Take 3 mLs (2.5 mg total) by nebulization every 4 (four) hours as needed for wheezing or shortness of breath.   amLODipine 5 MG tablet Commonly known as: NORVASC Take 5 mg by mouth daily.   APPLE CIDER VINEGAR PO Take 1 tablet by mouth daily.   atorvastatin 40 MG tablet Commonly known as: LIPITOR Take 40 mg by mouth daily.   b complex vitamins capsule Take 1 capsule by mouth daily.   ferrous sulfate 325 (65 FE) MG EC tablet Take 325 mg by mouth. One a day.   furosemide 40 MG tablet Commonly known as: LASIX Take 40 mg by mouth in the morning, at noon, and at bedtime.   gabapentin 600 MG tablet Commonly known as: NEURONTIN Take 600 mg by mouth at bedtime.   insulin glargine 100 UNIT/ML injection Commonly known as: LANTUS Inject 1.6 mLs (160 Units total) into the skin 2 (two) times daily. Replaces: Toujeo Max SoloStar 300  UNIT/ML Solostar Pen   JOINT HEALTH PO Take 1 tablet by mouth daily.   lisinopril-hydrochlorothiazide 20-12.5 MG tablet Commonly known as: ZESTORETIC Take 1 tablet by mouth in the morning and at bedtime.   magnesium oxide 400 MG tablet Commonly known as: MAG-OX Take 400 mg by mouth daily.   meloxicam 15 MG tablet Commonly known as: MOBIC Take 15 mg by mouth daily.   metoprolol succinate 50 MG 24 hr tablet Commonly known as: TOPROL-XL Take 50 mg by mouth daily.   pantoprazole 40 MG tablet Commonly known as: PROTONIX Take 40 mg by mouth daily.   Potassium 99 MG Tabs Take 1 tablet by mouth daily.   predniSONE 10 MG tablet Commonly known as: DELTASONE Take 3 tablets (30 mg total) by mouth daily for 7 days.   sertraline 100 MG tablet Commonly known as: ZOLOFT Take 200 mg by mouth daily.   tamsulosin 0.4 MG Caps capsule Commonly known as: FLOMAX Take 0.4 mg by mouth daily.   traMADol 50 MG tablet Commonly known as: ULTRAM Take 50 mg by mouth 4 (four) times daily as needed for moderate pain or severe pain.   Vitamin D (Ergocalciferol) 1.25 MG (50000 UNIT) Caps capsule Commonly known as: DRISDOL Take 50,000 Units by mouth every 7 (seven) days.            Durable Medical Equipment  (From admission, onward)         Start  Ordered   06/09/20 1453  For home use only DME oxygen  Once       Question Answer Comment  Length of Need 6 Months   Mode or (Route) Nasal cannula   Liters per Minute 2   Oxygen delivery system Gas      06/09/20 1458   06/09/20 0943  For home use only DME Walker rolling  Once       Question Answer Comment  Walker: With [place]   Patient needs a walker to treat with the following condition Weakness      06/09/20 0943           Discharge Care Instructions  (From admission, onward)         Start     Ordered   06/10/20 0000  Discharge wound care:       Comments: We will change your lower extremity wraps at your follow  up appointment.   06/10/20 1311          Disposition and follow-up:   [name] was discharged from [facility] in Stable condition.  At the hospital follow up visit please address:  1.   A. Atypical pneumonia: Discharged on home O2 due to continued desaturation while walking. CT lungs with severe bilateral multifocal infiltrates likely due to atypical pneumonia. Will need to be reevaluated for improvement in O2 status.  Further evualution for possible COPD may be needed if he does not improve.  B. Bilateral leg swelling and edema: Will likely continue to need Unna boots. May need Unna boots changed at follow up. Patient with [facility] who may be able to help with weekly dressing changes.  C. Diabetes: Discharged 160 units of Lantus twice a day. Was controlled on 150 units this admission but increased while on prednisone for gout. Will need close follow to restart on oral diabetes medications. Would benefit with follow up with diabetes coordinator may benefit from diet and insulin education. Will need adjustment of insulin when he completes prednisone course  C. Gout: Patient with new right elbow pain likely gout in the setting of diuretics. Discharged on 1 week course of prednisone. Will need to be evaluated for improvement may benefit from urate lowering therapy. May need adjustment insulin once off steroids.  D. BPH: Will need evaluation for bowel and bladder urge incontinence.   E. Cirrhosis and liver mass: Has follow up with GI in November. Will need repeat liver imaging in 4-6 months.  -May need note for insurance or job purposes  2.  Labs / imaging needed at time of follow-up: CMP, CBC , repeat imaging in 4 -6 weeks  3.  Pending labs/ test needing follow-up: None  Follow-up Appointments:  Follow-up Information    [place]. Go to.   Why: The clinic will call you to make an appoitment.  Contact information: [address] [place] Follow up.   Specialty: Dietician Why: This will be the same day as your clinic appointment at [facility].  Contact information: [place] [address] [facility] Follow up.   Why: rolling walker, home oxygen Contact information: [place] [address] [phone]        Home, [facility] Follow up.   Specialty: Home Health Services Why: HHRN,HHPT Contact  information: [address] [place] [address] [phone]               Hospital Course by problem list: Viaan Knippenberg is a 64 y.o. with pertinent PMH of CKD stage 3, HTN, T2DM, Anemia, GERD, BPH, HLD, CAD (previously told he has angina) HFpEF (EF 60%) who presented with a 1 month history of shortness of breath progressively got worse over the last week and admited for community-acquired pneumonia complicated by acute kidney injury, gout, and bilateral leg edema.  #Atypical pneumonia  Patient presents with 1 month history of shortness of breath with exertion that progressively got worse over the last week.  He does admit to fevers, chills, and cough during this time. On admission patient is hemodynamically stable without leukocytosis or lactic acidosis. However, he does have imaging consistent with multilobar pneumonia. He was treated for CP with 5 days of azithromycin and ceftriaxone with initial improvement. However he was found the be desaturating when walking down to 80% on room air with new O2 requirement of 2L. On repeat imaging he was found to have worsened multifocal lung infiltrates. He was COVID negative x2 and had a negative respiratory viral panel, however imaging and symptoms most consistent with atypical viral pneumonia. Discharged on 2L [place] with plan for close follow up for improvement.  #Bilateral leg edema,  multifactorial Patient presenting with mild leg edema for the past several months that acutely worsened during this admission. He did not develop CP, of JVD associated with this. Patient has a history of heart failure with preserved ejection fraction with EF of 60% and without ischemic cardiomyopathy on stress test. Leg edema likely multifactorial due to CHF, chronic venous insufficiency, and hypoalbuminemia secondary to cirrhosis. Swelling much improved on Lasix 8m BID and unna boots.  #Liver Cirrhosis with new liver lesions Patient with history of fatty liver disease and cirrhosis on CT imaging in 2010. Found to have incidental liver masses on renal [place]  Negative vial hepatitis panel, alpha antitrypsin, ceruloplasmin, or elevated ferritin. MRI with two liver lesions displaying "fatty metamorphosis" likely dysplastic nodules, characterized based on LI-RADS criteria as LR category 3, indeterminate with recommended follow-up MRI with multiphase evaluation in 4 -6 months. GI consulted recommending AFP level that was 11.3 not recommending immediate biopsy during admission. Has GI follow up in November 2021.  #Gout He has history of gout. Patient developed left knee pain and superficial knee nodule during admission that resolved with topical Volatern gel. He subsequently developed right elbow pain and swelling. No acute fracture on xray. Likely gout in the setting of diuresis. Discharged on 5 day course of prednisone.  #Right popliteal cyst Patient developed new right posterior throbbing knee pain. No joint line tenderness or effusion on exam. DVT studies negative for DVT however found to have right popliteal cyst. Pain resolved spontaneously.   #AKI in the setting of CKD Cr of 1.75 and GFR of 49 without a baseline for comparison on admission. There is some note of possible CKD stage III in his chart. CWaveHands.pldowntrended but increased in the setting of diuresis. Discharged with Cr of  1.46.  #T2DM Glucose of in 400s requiring insulin drip for control. Blood glucose controlled in 180s on 150 lantus BID. Held home medications with plan for insulin adjustmet on discharge.  #Hyperkalemia: Patient presents with an elevated creatinine and a potassium of 5.4.  Patient does take oral potassium at home. Improved to 4.1 on discharge.  #Normocytic Anemia, Thrombocytopenia: Hgb of  9.8 MCV of 96 with unknown baseline. Iron and ferritin studies suggesting iron deficiency. Hgb stable during admission not requiring transfusion. Patient has a platelet count of 78 on admission improved 161. Negative HIV. Anemia and thrombocytopenia likely due to hyperproliferation due to underlying liver cirrhosis.   Discharge Vitals:   BP 90/64 (BP Location: Left Arm)   Pulse 71   Temp 99 F (37.2 C) (Oral)   Resp 19   Ht '5\' 10"'  (1.778 m)   Wt 117.3 kg   SpO2 95%   BMI 37.12 kg/m   Pertinent Labs, Studies, and Procedures:  CLINICAL DATA:  Right elbow pain.  EXAM: RIGHT ELBOW - 2 VIEW  COMPARISON:  None  FINDINGS: Mild elbow joint degenerative changes but no acute bony abnormality or joint effusion. Calcification noted along the radial aspect of the elbow joint appears extra-articular. It could be related to prior radial collateral ligament injury or calcific extensor tendinopathy.  IMPRESSION: 1. Mild elbow joint degenerative changes but no acute bony findings or joint effusion. 2. Extra-articular calcification along the radial aspect of the joint as discussed above.   Electronically Signed   By: Marijo Sanes M.D.   On: 06/10/2020 06:34  CLINICAL DATA:  Persistent cough.  EXAM: CT CHEST WITHOUT CONTRAST  TECHNIQUE: Multidetector CT imaging of the chest was performed following the standard protocol without IV contrast.  COMPARISON:  September 25, 2017  FINDINGS: Cardiovascular: There is mild to moderate severity calcification of the aortic arch. No  significant vascular findings. Normal heart size with marked severity coronary artery calcification. No pericardial effusion.  Mediastinum/Nodes: There is mild pretracheal lymphadenopathy. Thyroid gland, trachea, and esophagus demonstrate no significant findings.  Lungs/Pleura: Marked severity bilateral multifocal infiltrates are seen.  There is no evidence of a pleural effusion or pneumothorax.  Upper Abdomen: The liver is cirrhotic in appearance.  The spleen is moderately enlarged.  Musculoskeletal: No chest wall mass or suspicious bone lesions identified.  IMPRESSION: 1. Marked severity bilateral multifocal infiltrates. 2. Marked severity coronary artery disease. 3. Cirrhosis and splenomegaly. 4. Aortic atherosclerosis.  Aortic Atherosclerosis (ICD10-I70.0).   Electronically Signed   By: Virgina Norfolk M.D.   On: 06/09/2020 19:34  CLINICAL DATA:  Liver lesion discovered on ultrasound evaluation, indeterminate on CT assessment.  EXAM: MRI ABDOMEN WITHOUT AND WITH CONTRAST  TECHNIQUE: Multiplanar multisequence MR imaging of the abdomen was performed both before and after the administration of intravenous contrast.  CONTRAST:  45m GADAVIST GADOBUTROL 1 MMOL/ML IV SOLN  COMPARISON:  CT evaluation of June 03, 2020  FINDINGS: Lower chest: Patchy bilateral airspace disease better demonstrated on recent CT assessment. No sign of pleural effusion.  Hepatobiliary: Liver displays a cirrhotic morphology and there are signs of portal hypertension. The portal vein is patent. Hepatic veins are patent. Exam quality is compromised by respiratory motion in the setting of pulmonary infection. MRI relies on breath holding for image quality. Therefore, lesion assessment is limited. On in and out of phase T1 weighted gradient echo sequences a lesion in hepatic subsegment VII shows signal loss on out of phase as does the area in the anteromedial segment  of the LEFT hepatic lobe. (Image 15 and image 17 of series 14) indicative of lipid containing nodules.  Assessment for enhancement is compromised by the motion described above. No definite arterial phase enhancement is demonstrated. The lesion in the anterior liver may be I so enhancing to surrounding parenchyma but again motion limits assessment. Lesion in posterior RIGHT hepatic lobe measuring approximately  15 mm. The lesion in the anteromedial LEFT hepatic lobe measuring approximately 9 mm.  A third lesion may be present in the inferior aspect of hepatic subsegment III (image 46, series 24) not well assessed on other sequences due to respiratory motion.  Pancreas: Pancreas without ductal dilation, inflammation or focal lesion.  Spleen: Spleen is enlarged as seen on recent CT evaluation approximately 18 cm craniocaudal dimension.  Adrenals/Urinary Tract:  Adrenal glands are normal.  There is symmetric renal enhancement with smooth renal contours. No hydronephrosis.  Stomach/Bowel: No acute gastrointestinal process to the extent evaluated. Limited assessment on MRI, study not performed for bowel evaluation.  Vascular/Lymphatic: Vascular structures in the abdomen are patent. There is no gastrohepatic or hepatoduodenal ligament lymphadenopathy. No retroperitoneal or mesenteric lymphadenopathy.  Other:  No ascites.  Musculoskeletal: No suspicious bone lesions identified.  IMPRESSION: 1. Hepatic cirrhosis with signs of portal hypertension. 2. Lesions in the liver two displaying "fatty metamorphosis" likely dysplastic nodules, characterized based on LI-RADS criteria as LR category 3, indeterminate. Three to six-month follow-up MRI with multiphase evaluation is suggested. Current exam quality is compromised by respiratory motion induced by parenchymal lung disease. 3. Pattern of pulmonary abnormalities could be seen in the setting of viral or atypical infection  including COVID-19 infection. 4. Patchy bilateral interstitial and airspace disease better demonstrated on recent CT assessment.   Electronically Signed   By: Zetta Bills M.D.   On: 06/05/2020 08:13   Discharge Instructions: Discharge Instructions    Ambulatory referral to Nutrition and Diabetic Education   Complete by: As directed    Diet - low sodium heart healthy   Complete by: As directed    Discharge instructions   Complete by: As directed    You were hospitalized for atypical viral pneumonia, lower leg swelling, gout, liver cirrhosis with new liver mass. Thank you for allowing [place] to be part of your care.    Please follow up with the following providers: Cato Mulligan, MD    [place]. Go to.   Why: You have an appointment scheduled for 06/16/2020 at 1:30pm Contact information: [address] [place] Follow up.   Specialty: Dietician Why: This will be the same day as your clinic appointment at [facility].  Contact information: [place] [address] [phone]   Please note these changes made to your medications:   - Medications to continue: Furosemide, meloxicam, tramadol, tamsulosin, pantoprazole, atorvastatin, lisinopril-hydrochlorothiazide, atorvastatin, amlodipine, gabapentin, setraline, and metoprolol  - Medications to start: Lantus 160 units 2 times daily, prednisone 30 mg daily for gout for 1 week, albuterol inhaler as needed for difficulty breathing  - Medications to discontinue:  Aspirin, glipizide, novolin, metformin, glipizide, and Toujeo  Please make sure to follow up in the clinic with Dr. Wynetta Emery, continue to use your oxygen and walker with acitivity.  Please call our clinic if you have any questions or concerns, we may be able to help and keep you from a long and expensive emergency room wait. Our  clinic and after hours phone number is [phone], the best time to call is Monday through Friday 9 am to 4 pm but there is always someone available 24/7 if you have an emergency. If you need medication refills please notify your pharmacy one week in advance and they will send [place] a request.   Discharge wound care:  Complete by: As directed    We will change your lower extremity wraps at your follow up appointment.   Increase activity slowly   Complete by: As directed    Increase activity slowly   Complete by: As directed       Signed: Iona Priestly, MD 06/10/2020, 1:11 PM   Pager: [phone]

## 2020-06-10 NOTE — Progress Notes (Cosign Needed)
HD#9 Subjective:  Overnight Events: No overnight events.  Patient States that he is doing well. He continues to require some oxygen, but denies SHOB. Continues to have productive cough with yellow sputum. He feels like many of his symptoms are resolving. He continues to have LE edema, but the Unna boots are helping him.   Objective:  Vital signs in last 24 hours: Vitals:   06/10/20 0031 06/10/20 0414 06/10/20 0809 06/10/20 0948  BP: (!) 126/54 (!) 118/56 (!) 127/52 (!) 124/55  Pulse: 78 73 77 77  Resp: 18 16  16   Temp: (!) 97.5 F (36.4 C) 98.6 F (37 C)  98.5 F (36.9 C)  TempSrc: Oral Oral  Oral  SpO2: 96% 93% 94% 93%  Weight:  117.3 kg    Height:       Supplemental O2: RA to 2L [place] SpO2: 93 % O2 Flow Rate (L/min): 2 L/min   Physical Exam:  Physical Exam Constitutional:      Appearance: Normal appearance. He is obese.  HENT:     Head: Normocephalic and atraumatic.  Eyes:     Extraocular Movements: Extraocular movements intact.  Cardiovascular:     Rate and Rhythm: Normal rate.     Pulses: Normal pulses.     Heart sounds: Normal heart sounds.  Pulmonary:     Effort: Pulmonary effort is normal.     Comments: Diminished lung sounds on the left no rales or rhonchi Abdominal:     General: There is no distension.     Palpations: Abdomen is soft.  Musculoskeletal:        General: Swelling present. Normal range of motion.     Cervical back: Normal range of motion.     Right lower leg: Right lower leg edema: 2+     Left lower leg: Left lower leg edema: 2+     Comments: Unna boots in place. R elbow pain with ROM joint is warm and exquisitely tender  Skin:    Capillary Refill: Capillary refill takes less than 2 seconds.  Neurological:     General: No focal deficit present.     Mental Status: He is alert.  Psychiatric:        Mood and Affect: Mood normal.     Filed Weights   06/08/20 0050 06/09/20 0544 06/10/20 0414  Weight: 122.2 kg 120.5 kg 117.3 kg      Intake/Output Summary (Last 24 hours) at 06/10/2020 1147 Last data filed at 06/10/2020 0300 Gross per 24 hour  Intake 780 ml  Output 300 ml  Net 480 ml   Net IO Since Admission: -3,185.43 mL [06/10/20 1147]  Pertinent Labs: CBC Latest Ref Rng & Units 06/09/2020 06/09/2020 06/08/2020  WBC 4.0 - 10.5 K/uL 7.3 7.2 7.3  Hemoglobin 13.0 - 17.0 g/dL 8.9(L) 9.1(L) 8.0(L)  Hematocrit 39 - 52 % 27.8(L) 28.1(L) 25.5(L)  Platelets 150 - 400 K/uL 161 149(L) 123(L)    CMP Latest Ref Rng & Units 06/10/2020 06/09/2020 06/08/2020  Glucose 70 - 99 mg/dL 239(H) 193(H) 132(H)  BUN 8 - 23 mg/dL 53(H) 54(H) 55(H)  Creatinine 0.61 - 1.24 mg/dL 1.46(H) 1.58(H) 1.64(H)  Sodium 135 - 145 mmol/L 135 137 133(L)  Potassium 3.5 - 5.1 mmol/L 4.1 4.3 4.4  Chloride 98 - 111 mmol/L 100 103 101  CO2 22 - 32 mmol/L 24 23 22   Calcium 8.9 - 10.3 mg/dL 9.0 8.9 8.6(L)  Total Protein 6.5 - 8.1 g/dL 7.0 6.8 6.7  Total Bilirubin 0.3 -  1.2 mg/dL 1.1 1.0 1.1  Alkaline Phos 38 - 126 U/L 148(H) 139(H) 110  AST 15 - 41 U/L 79(H) 82(H) 69(H)  ALT 0 - 44 U/L 79(H) 74(H) 64(H)   AFP 11.3  Pending Labs: none  Imaging: DG Elbow 2 Views Right  Result Date: 06/10/2020 CLINICAL DATA:  Right elbow pain. EXAM: RIGHT ELBOW - 2 VIEW COMPARISON:  None FINDINGS: Mild elbow joint degenerative changes but no acute bony abnormality or joint effusion. Calcification noted along the radial aspect of the elbow joint appears extra-articular. It could be related to prior radial collateral ligament injury or calcific extensor tendinopathy. IMPRESSION: 1. Mild elbow joint degenerative changes but no acute bony findings or joint effusion. 2. Extra-articular calcification along the radial aspect of the joint as discussed above. Electronically Signed   By: Marijo Sanes M.D.   On: 06/10/2020 06:34   CT CHEST WO CONTRAST  Result Date: 06/09/2020 CLINICAL DATA:  Persistent cough. EXAM: CT CHEST WITHOUT CONTRAST TECHNIQUE: Multidetector CT imaging of  the chest was performed following the standard protocol without IV contrast. COMPARISON:  September 25, 2017 FINDINGS: Cardiovascular: There is mild to moderate severity calcification of the aortic arch. No significant vascular findings. Normal heart size with marked severity coronary artery calcification. No pericardial effusion. Mediastinum/Nodes: There is mild pretracheal lymphadenopathy. Thyroid gland, trachea, and esophagus demonstrate no significant findings. Lungs/Pleura: Marked severity bilateral multifocal infiltrates are seen. There is no evidence of a pleural effusion or pneumothorax. Upper Abdomen: The liver is cirrhotic in appearance. The spleen is moderately enlarged. Musculoskeletal: No chest wall mass or suspicious bone lesions identified. IMPRESSION: 1. Marked severity bilateral multifocal infiltrates. 2. Marked severity coronary artery disease. 3. Cirrhosis and splenomegaly. 4. Aortic atherosclerosis. Aortic Atherosclerosis (ICD10-I70.0). Electronically Signed   By: Virgina Norfolk M.D.   On: 06/09/2020 19:34    Assessment/Plan:   Active Problems:   Pancytopenia (HCC)   Hypertension   Diabetes ([place])   Edema   Stage 3 chronic kidney disease   Benign prostatic hyperplasia   CAP (community acquired pneumonia)   Effusion of lower leg joint   AKI (acute kidney injury) ([place])  Patient Summary: Nicholas Rice a 64 y.o.with pertinent PMH of CKD stage 3, HTN, T2DM, Anemia, GERD, BPH, HLD, CAD (previously told he has angina)HFpEF (EF 60%)who presented with a 1 month history of shortness of breath progressively got worse over the last weekand admitted for community-acquired pneumonia complicated by worsening multifocal lobar infiltrates, acute kidney injury, leg edema and pain.  #Worsening bilateral chest infiltrates O2 desaturationwith ambulation to 91% yesterday requiring 2L. Repeat CXR with R>L peripheral infiltrates. Completed 5 days of antibiotics on 06/05/2020. Likely viral  etiology. Remains afebrile with stable WBC of 7.2. Currently on room air with saturating around 90%. - CT with significant  - Wean O2 as tolerated  #Bilateral lower extremity edema, multifactorial  #HFpEF Continues to have improvement in leg swelling with unna boot wraps Denies SHOB.  - Unna boots will need changed at follow up -Discharge home on home lasix  #Gout Pt with pain in right posterior elbow, very tender on palpation of the joint with redness, minimal swelling. Likely gout in the setting of diuresis. discharge on prednisone 30 mg daily for 1 week  #T2DM Controlled on current regimen -Continue Lantus 150 BID at home - Hgb A1c9.1  #Cirrhosis #Dysplasic Liverlesions Patient is well compensated without stigmata for cirrhosis. Will need OP follow up with GI.  - Discussed patient with GI PA Azucena Freed, recommending  patient follow up as an outpatient - Has appointments with Dr. Rush Landmark on 08/01/2020  #Possible AKI vs CKD Renal function improved today to. Cr of 1.46  and GFR of 45  Thrombocytopenia: Platelet count stable.  Diet: Carb-Modified IVF: None,None VTE: Enoxaparin Code: Full PT/OT recs: Home Health, none. TOC recs: none   Dispo: Anticipated discharge in 1 day   Iona Gose, MD Date 06/10/2020 Time 11:47 AM Pager: [phone] Please contact the on call pager after 5 pm and on weekends at [phone].

## 2020-06-11 ENCOUNTER — Other Ambulatory Visit: Payer: Self-pay

## 2020-06-11 MED ORDER — INSULIN GLARGINE 100 UNIT/ML ~~LOC~~ SOLN: 160.0000 [IU] | Freq: Two times a day (BID) | SUBCUTANEOUS | 3 refills | Status: DC

## 2020-06-11 NOTE — Telephone Encounter (Signed)
Erline Levine with [facility] requesting to speak with a nurse about insulin glargine (LANTUS) 100 UNIT/ML injection. Please call back.

## 2020-06-11 NOTE — Telephone Encounter (Signed)
Return call to [place], talked to [place]. Stated received rx yesterday for Lantus; needs clarification on dosage and qty. Stated qty of 10 ml will only last 4 days. And 160 units BID seems like a lot and insulin syring only go up to 100 units. Thanks

## 2020-06-11 NOTE — Telephone Encounter (Signed)
Clarified dosing with Dr. Leonides Cave who was the resident discharge provider. He confirms that the patient is taking lantus 160 units twice a day. 30d supply sent to pharmacy. Discussed with patient's pharmacy, [facility] in [place]. They note that his co-pay will be >$300.  Mitzi Hansen, MD Internal Medicine Resident PGY-2 [facility] Pager: [phone] 06/11/2020 11:53 AM

## 2020-06-12 NOTE — Telephone Encounter (Signed)
Tried calling this patient. Their voicemail box is not set up. I was unable to leave a message  

## 2020-06-13 DIAGNOSIS — I2510 Atherosclerotic heart disease of native coronary artery without angina pectoris: Secondary | ICD-10-CM | POA: Diagnosis not present

## 2020-06-13 DIAGNOSIS — M109 Gout, unspecified: Secondary | ICD-10-CM | POA: Diagnosis not present

## 2020-06-13 DIAGNOSIS — J188 Other pneumonia, unspecified organism: Secondary | ICD-10-CM | POA: Diagnosis not present

## 2020-06-13 DIAGNOSIS — K7460 Unspecified cirrhosis of liver: Secondary | ICD-10-CM | POA: Diagnosis not present

## 2020-06-13 DIAGNOSIS — I130 Hypertensive heart and chronic kidney disease with heart failure and stage 1 through stage 4 chronic kidney disease, or unspecified chronic kidney disease: Secondary | ICD-10-CM | POA: Diagnosis not present

## 2020-06-13 DIAGNOSIS — D631 Anemia in chronic kidney disease: Secondary | ICD-10-CM | POA: Diagnosis not present

## 2020-06-13 DIAGNOSIS — N183 Chronic kidney disease, stage 3 unspecified: Secondary | ICD-10-CM | POA: Diagnosis not present

## 2020-06-13 DIAGNOSIS — E1142 Type 2 diabetes mellitus with diabetic polyneuropathy: Secondary | ICD-10-CM | POA: Diagnosis not present

## 2020-06-13 DIAGNOSIS — I5030 Unspecified diastolic (congestive) heart failure: Secondary | ICD-10-CM | POA: Diagnosis not present

## 2020-06-13 DIAGNOSIS — E1122 Type 2 diabetes mellitus with diabetic chronic kidney disease: Secondary | ICD-10-CM | POA: Diagnosis not present

## 2020-06-16 ENCOUNTER — Encounter: Payer: Medicare HMO | Admitting: Dietician

## 2020-06-16 ENCOUNTER — Encounter: Payer: Medicare HMO | Admitting: Student

## 2020-06-17 ENCOUNTER — Ambulatory Visit (INDEPENDENT_AMBULATORY_CARE_PROVIDER_SITE_OTHER): Payer: Medicare HMO | Admitting: Cardiology

## 2020-06-17 ENCOUNTER — Encounter: Payer: Self-pay | Admitting: Cardiology

## 2020-06-17 ENCOUNTER — Other Ambulatory Visit: Payer: Self-pay

## 2020-06-17 VITALS — BP 120/52 | HR 90 | Ht 70.0 in | Wt 248.0 lb

## 2020-06-17 DIAGNOSIS — E118 Type 2 diabetes mellitus with unspecified complications: Secondary | ICD-10-CM | POA: Diagnosis not present

## 2020-06-17 DIAGNOSIS — I350 Nonrheumatic aortic (valve) stenosis: Secondary | ICD-10-CM

## 2020-06-17 DIAGNOSIS — E782 Mixed hyperlipidemia: Secondary | ICD-10-CM | POA: Diagnosis not present

## 2020-06-17 DIAGNOSIS — I5032 Chronic diastolic (congestive) heart failure: Secondary | ICD-10-CM

## 2020-06-17 HISTORY — DX: Nonrheumatic aortic (valve) stenosis: I35.0

## 2020-06-17 NOTE — Progress Notes (Signed)
Cardiology Office Note:    Date:  06/17/2020   ID:  Nicholas Rice, DOB 09-27-1956, MRN [id number]  PCP:  Marianna Payment, MD  Cardiologist:  Jenne Campus, MD    Referring MD: Cher Nakai, MD   Chief Complaint  Patient presents with  . Follow-up  Doing better  History of Present Illness:    Nicholas Rice is a 64 y.o. male who was referred to [place] initially because of shortness of breath and fatigue.  He does have history of poorly controlled diabetes with hemoglobin A1c more than 10, essential hypertension, dyslipidemia, he is an ex-smoker and he is also obese.  He did have a stress test done which showed no evidence of ischemia, he also had an echocardiogram done which showed preserved left ventricular ejection fraction, after that he check into hospital because of worsening of shortness of breath he was found to have atypical pneumonia.  He was managed appropriately with antibiotic and being discharged home a week ago.  He comes today to my office for follow-up he said he feels dramatically better he started also walking on the regular basis.  Swelling of lower extremities improved significantly.  He is very happy the way he feels.  Past Medical History:  Diagnosis Date  . Anemia   . Benign prostatic hyperplasia   . CAD in native artery   . Depression   . Diabetic neuropathy ([place])   . Diastolic heart failure ([place])   . Edema   . Enlarged prostate   . GERD (gastroesophageal reflux disease)   . Gout   . Hyperlipidemia   . Hypertension   . Hypogonadism in male   . Muscle cramp   . Osteoarthritis   . Pancytopenia ([place]) 01/02/2015  . Peripheral neuropathy   . 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 Renal insufficiency   . Stage 3 chronic kidney disease   . Type 2 diabetes with complication [facility])     Past Surgical History:  Procedure Laterality Date  . CATARACT EXTRACTION Bilateral   . CHOLECYSTECTOMY  1981  . [place]  . TONSILLECTOMY  1963     Current Medications: Current Meds  Medication Sig  . ACCU-CHEK GUIDE test strip   . Accu-Chek Softclix Lancets lancets   . albuterol (PROVENTIL) (2.5 MG/3ML) 0.083% nebulizer solution Take 3 mLs (2.5 mg total) by nebulization every 4 (four) hours as needed for wheezing or shortness of breath.  Marland Kitchen amLODipine (NORVASC) 5 MG tablet Take 5 mg by mouth daily.   . APPLE CIDER VINEGAR PO Take 1 tablet by mouth daily.   Marland Kitchen atorvastatin (LIPITOR) 40 MG tablet Take 40 mg by mouth daily.  Marland Kitchen b complex vitamins capsule Take 1 capsule by mouth daily.  . Blood Glucose Monitoring Suppl (ACCU-CHEK GUIDE) w/Device KIT   . ferrous sulfate 325 (65 FE) MG EC tablet Take 325 mg by mouth. One a day.  . furosemide (LASIX) 40 MG tablet Take 40 mg by mouth in the morning, at noon, and at bedtime.   . gabapentin (NEURONTIN) 600 MG tablet Take 600 mg by mouth at bedtime.   . insulin glargine (LANTUS) 100 UNIT/ML injection Inject 1.6 mLs (160 Units total) into the skin 2 (two) times daily.  Marland Kitchen lisinopril-hydrochlorothiazide (PRINZIDE,ZESTORETIC) 20-12.5 MG tablet Take 1 tablet by mouth in the morning and at bedtime.   . magnesium oxide (MAG-OX) 400 MG tablet Take 400 mg by mouth daily.   . meloxicam (MOBIC) 15 MG  tablet Take 15 mg by mouth daily.   . Misc Natural Products (JOINT HEALTH PO) Take 1 tablet by mouth daily.   . pantoprazole (PROTONIX) 40 MG tablet Take 40 mg by mouth daily.  . Potassium 99 MG TABS Take 1 tablet by mouth daily.   . predniSONE (DELTASONE) 10 MG tablet Take 3 tablets (30 mg total) by mouth daily for 7 days.  Marland Kitchen sertraline (ZOLOFT) 100 MG tablet Take 200 mg by mouth daily.   . tamsulosin (FLOMAX) 0.4 MG CAPS capsule Take 0.4 mg by mouth daily.  . traMADol (ULTRAM) 50 MG tablet Take 50 mg by mouth 4 (four) times daily as needed for moderate pain or severe pain.   . Vitamin D, Ergocalciferol, (DRISDOL) 1.25 MG (50000 UNIT) CAPS capsule Take 50,000 Units by mouth every 7 (seven) days.      Allergies:   Loratadine   Social History   Socioeconomic History  . Marital status: Single    Spouse name: Not on file  . Number of children: Not on file  . Years of education: Not on file  . Highest education level: Not on file  Occupational History  . Not on file  Tobacco Use  . Smoking status: Former Research scientist (life sciences)  . Smokeless tobacco: Never Used  Substance and Sexual Activity  . Alcohol use: Never  . Drug use: Never  . Sexual activity: Not on file  Other Topics Concern  . Not on file  Social History Narrative  . Not on file   Social Determinants of Health   Financial Resource Strain:   . Difficulty of Paying Living Expenses: Not on file  Food Insecurity:   . Worried About Charity fundraiser in the Last Year: Not on file  . Ran Out of Food in the Last Year: Not on file  Transportation Needs:   . Lack of Transportation (Medical): Not on file  . Lack of Transportation (Non-Medical): Not on file  Physical Activity:   . Days of Exercise per Week: Not on file  . Minutes of Exercise per Session: Not on file  Stress:   . Feeling of Stress : Not on file  Social Connections:   . Frequency of Communication with Friends and Family: Not on file  . Frequency of Social Gatherings with Friends and Family: Not on file  . Attends Religious Services: Not on file  . Active Member of Clubs or Organizations: Not on file  . Attends Archivist Meetings: Not on file  . Marital Status: Not on file     Family History: The patient's family history includes Congestive Heart Failure in his mother; Diabetes in his father and mother; Emphysema in his mother; Heart attack in his father; Heart disease in his father. ROS:   Please see the history of present illness.    All 14 point review of systems negative except as described per history of present illness  EKGs/Labs/Other Studies Reviewed:      Recent Labs: 05/19/2020: NT-Pro BNP 79 06/01/2020: B Natriuretic Peptide 184.7;  Magnesium 1.7 06/09/2020: Hemoglobin 8.9; Platelets 161 06/10/2020: ALT 79; BUN 53; Creatinine, Ser 1.46; Potassium 4.1; Sodium 135  Recent Lipid Panel    Component Value Date/Time   CHOL 111 04/26/2020 0000   TRIG 345 (A) 04/26/2020 0000   VLDL 50 04/26/2020 0000    Physical Exam:    VS:  BP (!) 120/52 (BP Location: Right Arm, Patient Position: Sitting, Cuff Size: Normal)   Pulse 90   Ht  '5\' 10"'  (1.778 m)   Wt 248 lb (112.5 kg)   SpO2 93%   BMI 35.58 kg/m     Wt Readings from Last 3 Encounters:  06/17/20 248 lb (112.5 kg)  06/10/20 258 lb 11.2 oz (117.3 kg)  05/22/20 267 lb (121.1 kg)     GEN:  Well nourished, well developed in no acute distress HEENT: Normal NECK: No JVD; No carotid bruits LYMPHATICS: No lymphadenopathy CARDIAC: RRR, systolic ejection murmur grade 1/6 best heard at the right upper portion of the sternum, no rubs, no gallops RESPIRATORY:  Clear to auscultation without rales, wheezing or rhonchi  ABDOMEN: Soft, non-tender, non-distended MUSCULOSKELETAL:  No edema; No deformity  SKIN: Warm and dry LOWER EXTREMITIES: no swelling NEUROLOGIC:  Alert and oriented x 3 PSYCHIATRIC:  Normal affect   ASSESSMENT:    1. Chronic diastolic heart failure ([place])   2. Type 2 diabetes with complication (HCC)   3. Mixed hyperlipidemia   4. Nonrheumatic aortic valve stenosis    PLAN:    In order of problems listed above:  1. Chronic diastolic congestive heart failure.  Seems to be compensated right now we'll not change any of his medication. 2. Type 2 diabetes followed by antimedicine team.  His hemoglobin A1c in September 5 was 9.1 this is better but still will long way to go.  Definitely we need to take care of this his follow-up by his primary care physician. 3. Mixed dyslipidemia he is taking Lipitor 40 which is high intense statin which I'll continue. 4. Nonrheumatic aortic valve stenosis: Only mild, continue present management he will require regular  follow-up. 5. Overall cardiac wise seems to be doing well the key is risk factors modification spent below of time talking about proper diet as well as need to exercise which he is trying to do. 6. He used to be on aspirin however that was discontinued because of anemia while he was in the hospital.  I my opinion him to back to be back on this medication. 7. I did review record from the hospital for this visit   Medication Adjustments/Labs and Tests Ordered: Current medicines are reviewed at length with the patient today.  Concerns regarding medicines are outlined above.  No orders of the defined types were placed in this encounter.  Medication changes: No orders of the defined types were placed in this encounter.   Signed, Park Liter, MD, [facility] 06/17/2020 2:58 PM    [place]

## 2020-06-17 NOTE — Patient Instructions (Signed)
Medication Instructions:  Your physician recommends that you continue on your current medications as directed. Please refer to the Current Medication list given to you today.  *If you need a refill on your cardiac medications before your next appointment, please call your pharmacy*   Lab Work: None If you have labs (blood work) drawn today and your tests are completely normal, you will receive your results only by: MyChart Message (if you have MyChart) OR A paper copy in the mail If you have any lab test that is abnormal or we need to change your treatment, we will call you to review the results.   Testing/Procedures: None   Follow-Up: At [facility], you and your health needs are our priority.  As part of our continuing mission to provide you with exceptional heart care, we have created designated Provider Care Teams.  These Care Teams include your primary Cardiologist (physician) and Advanced Practice Providers (APPs -  Physician Assistants and Nurse Practitioners) who all work together to provide you with the care you need, when you need it.  We recommend signing up for the patient portal called "MyChart".  Sign up information is provided on this After Visit Summary.  MyChart is used to connect with patients for Virtual Visits (Telemedicine).  Patients are able to view lab/test results, encounter notes, upcoming appointments, etc.  Non-urgent messages can be sent to your provider as well.   To learn more about what you can do with MyChart, go to [email or web address].    Your next appointment:   5 month(s)  The format for your next appointment:   In Person  Provider:   Ishmail Krasowski, MD{    Other Instructions    

## 2020-06-18 ENCOUNTER — Ambulatory Visit (INDEPENDENT_AMBULATORY_CARE_PROVIDER_SITE_OTHER): Payer: Medicare HMO | Admitting: Internal Medicine

## 2020-06-18 ENCOUNTER — Encounter: Payer: Self-pay | Admitting: Internal Medicine

## 2020-06-18 ENCOUNTER — Ambulatory Visit (INDEPENDENT_AMBULATORY_CARE_PROVIDER_SITE_OTHER): Payer: Medicare HMO | Admitting: Dietician

## 2020-06-18 ENCOUNTER — Encounter: Payer: Self-pay | Admitting: Dietician

## 2020-06-18 ENCOUNTER — Telehealth: Payer: Self-pay

## 2020-06-18 VITALS — BP 143/61 | HR 93 | Temp 97.9°F | Ht 70.0 in | Wt 249.6 lb

## 2020-06-18 DIAGNOSIS — E118 Type 2 diabetes mellitus with unspecified complications: Secondary | ICD-10-CM

## 2020-06-18 DIAGNOSIS — I5032 Chronic diastolic (congestive) heart failure: Secondary | ICD-10-CM | POA: Diagnosis not present

## 2020-06-18 DIAGNOSIS — R252 Cramp and spasm: Secondary | ICD-10-CM

## 2020-06-18 DIAGNOSIS — R253 Fasciculation: Secondary | ICD-10-CM

## 2020-06-18 DIAGNOSIS — K7460 Unspecified cirrhosis of liver: Secondary | ICD-10-CM

## 2020-06-18 DIAGNOSIS — Z794 Long term (current) use of insulin: Secondary | ICD-10-CM | POA: Diagnosis not present

## 2020-06-18 DIAGNOSIS — R69 Illness, unspecified: Secondary | ICD-10-CM | POA: Diagnosis not present

## 2020-06-18 DIAGNOSIS — E875 Hyperkalemia: Secondary | ICD-10-CM

## 2020-06-18 DIAGNOSIS — Z701 Counseling related to patient's sexual behavior and orientation: Secondary | ICD-10-CM | POA: Diagnosis not present

## 2020-06-18 DIAGNOSIS — J189 Pneumonia, unspecified organism: Secondary | ICD-10-CM

## 2020-06-18 DIAGNOSIS — I10 Essential (primary) hypertension: Secondary | ICD-10-CM

## 2020-06-18 DIAGNOSIS — I872 Venous insufficiency (chronic) (peripheral): Secondary | ICD-10-CM

## 2020-06-18 DIAGNOSIS — M109 Gout, unspecified: Secondary | ICD-10-CM | POA: Diagnosis not present

## 2020-06-18 DIAGNOSIS — G4733 Obstructive sleep apnea (adult) (pediatric): Secondary | ICD-10-CM

## 2020-06-18 DIAGNOSIS — E1169 Type 2 diabetes mellitus with other specified complication: Secondary | ICD-10-CM

## 2020-06-18 DIAGNOSIS — R400 Somnolence: Secondary | ICD-10-CM | POA: Diagnosis not present

## 2020-06-18 DIAGNOSIS — I110 Hypertensive heart disease with heart failure: Secondary | ICD-10-CM | POA: Diagnosis not present

## 2020-06-18 DIAGNOSIS — M10229 Drug-induced gout, unspecified elbow: Secondary | ICD-10-CM

## 2020-06-18 LAB — GLUCOSE, CAPILLARY: Glucose-Capillary: 257 mg/dL — ABNORMAL HIGH (ref 70–99)

## 2020-06-18 MED ORDER — LEVEMIR FLEXTOUCH 100 UNIT/ML ~~LOC~~ SOPN: 160.0000 [IU] | PEN_INJECTOR | Freq: Two times a day (BID) | SUBCUTANEOUS | 11 refills | Status: DC

## 2020-06-18 NOTE — Telephone Encounter (Signed)
TC from [place] with Tillie Rung who states they received hospital discharge orders to evaluate patient for PT.  She would like VO for: PT once/week for 6 weeks to work on strength, balance, and ambulation. VO given Will route to red team for approval or denial.  Also, patient has an appt today at 2:45 w/ Dr. Sharon Seller; Anda Kraft states he will need a nebulizer machine.  Was sent home from hospital with neb meds, no machine. Thank you, SChaplin, RN,BSN

## 2020-06-18 NOTE — Patient Instructions (Signed)
Nicholas Rice,  Thank you for your visit today. I think it might be helpful  to write down some of your meals/ snacks while you are wearing the monitor.  I enclosed a sheet to record you food intake on- do as well as you can and as much as you can- spelling and neatness DO NOT Count!  Please record the time, amount and what food drinks and activities you have while wearing the continuous glucose monitor (CGM).  Bring the food record and [employer] reader with you to follow up appointment.   Do not have a CT or an MRI while wearing the CGM.   Please schedule a visit in 2 weeks right before you meet with the doctor  to have your reader downloaded.  Butch Penny [phone]

## 2020-06-18 NOTE — Telephone Encounter (Signed)
Agree with plan 

## 2020-06-18 NOTE — Patient Instructions (Signed)
Thank you for allowing [place] to provide your care today. Today we discussed your type II diabetes and   I have ordered the following labs for you:    I will call if any are abnormal.    Today we made the following changes to your medications:   Please START taking  levemir - take 160 units twice per day   Please follow-up in two weeks with glucose readings.    Please call the internal medicine center clinic if you have any questions or concerns, we may be able to help and keep you from a long and expensive emergency room wait. Our clinic and after hours phone number is [phone], the best time to call is Monday through Friday 9 am to 4 pm but there is always someone available 24/7 if you have an emergency. If you need medication refills please notify your pharmacy one week in advance and they will send [place] a request.

## 2020-06-18 NOTE — Progress Notes (Signed)
Medical Nutrition Therapy:  Appt start time: 5638 end time:  1700. Total time: 90 Visit # 1  06/18/2020 Nicholas Rice [id number]  Assessment:  Primary concerns today: taking care of his diabetes.  [name] reports having diabetes for 40 year, the last 10 on insulin. He states his father was on insulin also by age 65. He does not think his diabetes has been well controlled. He lives alone and is on a limited budget, his son wife and newborn lives about 7 miles away. He is motivated to learn about his diabetes to get better control of it.  He would also like to decrease his weight and reduce his insulin doses. He recently cleaned out his pantry at home after readings the labels for salt. He eats out sometimes but limits this due to his budget. He cooks with an Information systems manager and buys lean meats. He was very interested in using a Continuous glucose monitor, so a sample was provided to him today and he was educated on how to use it.   Preferred Learning Style: auditory,Visual, Hands on  Learning Readiness: Ready and is making changes in progress  ANTHROPOMETRICS: Estimated body mass index is 35.81 kg/m as calculated from the following:   Height as of an earlier encounter on 06/18/20: 5\' 10"  (1.778 m).   Weight as of an earlier encounter on 06/18/20: 249 lb 9.6 oz (113.2 kg).  WEIGHT HISTORY:  Wt Readings from Last 5 Encounters:  06/18/20 249 lb 9.6 oz (113.2 kg)  06/17/20 248 lb (112.5 kg)  06/10/20 258 lb 11.2 oz (117.3 kg)  05/22/20 267 lb (121.1 kg)  05/19/20 267 lb (121.1 kg)   SLEEP:need to assess at future visit MEDICATIONS: has signed up for [employer] patient assistance for his lantus BLOOD SUGAR: he says it has been 200s and did not bring his meter Lab Results  Component Value Date   HGBA1C 9.1 (H) 06/01/2020     DIETARY INTAKE: Usual eating pattern includes 2 meals and 1-2 snacks per day. He loves fruits and vegetables but has limited them due to their affects on blood sugars Dining Out  (times/week): 1x 24-hr recall:  Arises at 5-12 Checks blood sugar and takes insulin B/L ( PM): 3-5 slices wh wheat bread, 2-4 eggs Snk ( PM): 1/2 peanut butter and jelly sandwich D ( 6-8PM): lean hamburger, 2 slices whole wheat bread nion, mustard and premade green salad Takes insulin and check sugar 11PM-12 am Snk ( 1-3 am): grahams and peanut butter Beverages: diet coke, diet dr pepper, diet ginger ale and diet 7 up , homemade tea sweetened with stevia  Usual physical activity: walks but not sure how often and how long   Progress Towards Goal(s):  In progress.   Nutritional Diagnosis:  NB-1.1 Food and nutrition-related knowledge deficit As related to lack of sufficient prior knowledge of diabetes self management.  As evidenced by his report and eagerness to learn today.    Intervention:  Nutrition education about carb counting and meal planning for diabetes, Continuous glucose monitoring and diabetes medicines. Action Goal: collect blood sugars on cgm and bring back in 2 weeks  Outcome goal: improved well being and blood sugars Coordination of care: discuss starting a GLP-1 to assist with weight and blood sugar control with his doctor's team  Teaching Method Utilized: Visual, Auditory,Hands on Handouts given during visit include:cgm sample with information about finding a supplier, carb counting and meal planning guide Barriers to learning/adherence to lifestyle change: material resources Demonstrated degree  of understanding via:  Teach Back   Monitoring/Evaluation:  Dietary intake, exercise, reader, and body weight in 2 week(s)  Nicholas Rice, RD 06/18/2020 5:52 PM. .

## 2020-06-19 ENCOUNTER — Telehealth: Payer: Self-pay

## 2020-06-19 ENCOUNTER — Other Ambulatory Visit: Payer: Self-pay | Admitting: Internal Medicine

## 2020-06-19 DIAGNOSIS — J189 Pneumonia, unspecified organism: Secondary | ICD-10-CM | POA: Diagnosis not present

## 2020-06-19 DIAGNOSIS — I872 Venous insufficiency (chronic) (peripheral): Secondary | ICD-10-CM | POA: Insufficient documentation

## 2020-06-19 DIAGNOSIS — R400 Somnolence: Secondary | ICD-10-CM | POA: Insufficient documentation

## 2020-06-19 DIAGNOSIS — R0602 Shortness of breath: Secondary | ICD-10-CM | POA: Diagnosis not present

## 2020-06-19 HISTORY — DX: Venous insufficiency (chronic) (peripheral): I87.2

## 2020-06-19 HISTORY — DX: Somnolence: R40.0

## 2020-06-19 LAB — CMP14 + ANION GAP
ALT: 47 IU/L — ABNORMAL HIGH (ref 0–44)
AST: 43 IU/L — ABNORMAL HIGH (ref 0–40)
Albumin/Globulin Ratio: 1.3 (ref 1.2–2.2)
Albumin: 4.1 g/dL (ref 3.8–4.8)
Alkaline Phosphatase: 184 IU/L — ABNORMAL HIGH (ref 44–121)
Anion Gap: 18 mmol/L (ref 10.0–18.0)
BUN/Creatinine Ratio: 30 — ABNORMAL HIGH (ref 10–24)
BUN: 34 mg/dL — ABNORMAL HIGH (ref 8–27)
Bilirubin Total: 0.4 mg/dL (ref 0.0–1.2)
CO2: 22 mmol/L (ref 20–29)
Calcium: 9.7 mg/dL (ref 8.6–10.2)
Chloride: 100 mmol/L (ref 96–106)
Creatinine, Ser: 1.14 mg/dL (ref 0.76–1.27)
GFR calc Af Amer: 78 mL/min/{1.73_m2} (ref >59)
GFR calc non Af Amer: 67 mL/min/{1.73_m2} (ref >59)
Globulin, Total: 3.1 g/dL (ref 1.5–4.5)
Glucose: 229 mg/dL — ABNORMAL HIGH (ref 65–99)
Potassium: 5.5 mmol/L — ABNORMAL HIGH (ref 3.5–5.2)
Sodium: 140 mmol/L (ref 134–144)
Total Protein: 7.2 g/dL (ref 6.0–8.5)

## 2020-06-19 LAB — CBC WITH DIFFERENTIAL/PLATELET
Basophils Absolute: 0 10*3/uL (ref 0.0–0.2)
Basos: 0 %
EOS (ABSOLUTE): 0 10*3/uL (ref 0.0–0.4)
Eos: 0 %
Hematocrit: 34.3 % — ABNORMAL LOW (ref 37.5–51.0)
Hemoglobin: 11.5 g/dL — ABNORMAL LOW (ref 13.0–17.7)
Immature Grans (Abs): 0.1 10*3/uL (ref 0.0–0.1)
Immature Granulocytes: 1 %
Lymphocytes Absolute: 0.8 10*3/uL (ref 0.7–3.1)
Lymphs: 8 %
MCH: 30 pg (ref 26.6–33.0)
MCHC: 33.5 g/dL (ref 31.5–35.7)
MCV: 90 fL (ref 79–97)
Monocytes Absolute: 0.3 10*3/uL (ref 0.1–0.9)
Monocytes: 3 %
Neutrophils Absolute: 8.2 10*3/uL — ABNORMAL HIGH (ref 1.4–7.0)
Neutrophils: 88 %
Platelets: 227 10*3/uL (ref 150–450)
RBC: 3.83 x10E6/uL — ABNORMAL LOW (ref 4.14–5.80)
RDW: 14.5 % (ref 11.6–15.4)
WBC: 9.4 10*3/uL (ref 3.4–10.8)

## 2020-06-19 LAB — MAGNESIUM: Magnesium: 1.7 mg/dL (ref 1.6–2.3)

## 2020-06-19 LAB — URIC ACID: Uric Acid: 8.6 mg/dL — ABNORMAL HIGH (ref 3.8–8.4)

## 2020-06-19 MED ORDER — ALLOPURINOL 100 MG PO TABS: 50.0000 mg | ORAL_TABLET | Freq: Every day | ORAL | 2 refills | Status: DC

## 2020-06-19 MED ORDER — LOKELMA 5 G PO PACK: 5.0000 g | PACK | Freq: Every day | ORAL | 0 refills | Status: DC

## 2020-06-19 NOTE — Telephone Encounter (Signed)
This makes way more sense. He told me he was taking NPH 160U bid, and that was it. Thanks for your help!

## 2020-06-19 NOTE — Assessment & Plan Note (Signed)
Thought to be secondary to HCTZ, treated with prednisone. He has one day of this left and pain has resolved. He was discharged with lisinopril, which may help lower uric acid level.   - complete prednisone  - uric acid  - uric acid slightly elevated. Will start allopurinol 50 mg per day. He may be able to come off of this eventually as ACEi can lower urate levels over time.

## 2020-06-19 NOTE — Assessment & Plan Note (Addendum)
Lower extremity swelling completed resolved. He is using his compression stockings daily. He is also on lasix and HCTZ for hypertension and HFpEF.   - continue compression stockings

## 2020-06-19 NOTE — Assessment & Plan Note (Signed)
Recently admitted for multifocal pneumonia and discharged on home supplemental oxygen. On ambulating into the clinic he checked his oxygen twice and was at 93% and 95%. He has been using the oxygen at home, which has been helping. He has no smoking history. He is asking for nebulizer as this was never delivered although all the parts that go with it were. He states the albuterol has helped his breathing significantly while admitted to the hospital.  On physical exam lungs are clear to auscultation. O2 saturation did initially drop to 81% but increased with deep breathing.   - cont. Supplemental O2 prn, will have him ambulate with o2 sats at follow-up in two weeks  - DME nebulizer ordered for albuterol nebulizer

## 2020-06-19 NOTE — Assessment & Plan Note (Signed)
He was tested for OSA in the past but is more worried about this now. He endorses daytime somnolence and frequent awakenings throughout the night.   - split night study

## 2020-06-19 NOTE — Assessment & Plan Note (Addendum)
He is having muscle cramps and spasming. These have been chronic in the past and can cause him a lot of pain sometimes. He is not currently having in pain. He does have muscle fasciculations currently.   - CMP   ADDENDUM: hyperkalemic to 5.5. He is taking an over the counter potassium supplement daily, which he was taking previously when he was on lasix 40mg  tid. This appears to only be about 4 mEq. Discussed the need to stop the potassium supplement for now. - Will give one time dose lokelma as this may be the cause of his symptoms, although is chronic  - f/u lab in three days. He may need to stop lisinopril if K still elevated.  - will need further work-up if no resolution

## 2020-06-19 NOTE — Telephone Encounter (Signed)
RE: nebulizer machine Received: Teryl Lucy; [place], [place], [place]; [place], [place]; Ankrum, Hulan Fess; 1 other   We can get this out to the patient quickly however, there isn't an order for a nebulizer in the system. Can you please place an order into the system so we can pull and dispense?    Thanks!       Previous Messages   ----- Message -----  From: Skeet Latch  Sent: 06/18/2020  4:06 PM EDT  To: Darlina Guys, Cletis [place], *  Subject: RE: nebulizer machine                 No problem!   ----- Message -----  From: Judge Stall, NT  Sent: 06/18/2020  3:18 PM EDT  To: Darlina Guys, [facility]  Subject: nebulizer machine                 Good afternoon ladies we have this patient who was discharge from the hospital, they brought him oxygen, concentrators hoses for his breathing machine but no nebulizer machine. This is our first time seeing him.can you help with this Nicholas Rice C9/22/20213:25 PM

## 2020-06-19 NOTE — Telephone Encounter (Signed)
I thought he told me yesterday that he has plenty of NPH insulin at home and plans to use that until his patient assistance for Lantus starts. I plan to call him to see if his CGM is working and can confirm this when I do.   I called Nicholas Rice and he conforms that he has plenty of NPH insulin and also has Toujeo pens at home that he can take at the prescribed doses for now. He says he cannot afford the [place] at 386$ and the 59.99 per month for lantus insulin is  going to stretch his finances. He is interested in getting help with patient assistance for continued source of insulin because he finds it hard to afford.  He says the personal CGM is workring very well with one alarm for high last night after eating and he is using it to help him control his blood sugars.

## 2020-06-19 NOTE — Telephone Encounter (Signed)
Pt states insulin detemir (LEVEMIR FLEXTOUCH) 100 UNIT/ML FlexPen cost $300.00 for 30 days supply.

## 2020-06-19 NOTE — Telephone Encounter (Signed)
[place], [place], [place]  Skeet Latch; [place], [place]; Ankrum, Elspeth Cho, Georges Mouse Cc: Tamalyn Wadsworth, Orvis Brill, RN; [place], [place] C, [place]  Order was placed on 06-18-2020 by Dr. Sharon Seller, patient discharge home but got equipment for nebulizer machine but no machine

## 2020-06-19 NOTE — Assessment & Plan Note (Signed)
Unable to pick up lantus 160 bid due to expense. He states he contacted the manufacturer directly, and can obtain lantus for $60 per month although will not be able to start receiving the medication until 10/22. He has been taking previous novolin 160 mg bid instead without symptoms of hypoglycemia. He checks glucose prior to taking medications and states this has been around 250. He has one dose of steroids left for treatment of gout flare.   - levemir 160 units bid - appears to be covered on Epic  - f/u in two weeks with glucose readings  - meeting with diabetes coordinator today  - referral to ophthalmology placed   ADDENDUM: levemir not covered, staff is working to obtain this medication for him. Otherwise he states he can get lantus directly from the manufacturer although this would not be sent until October 22nd.

## 2020-06-19 NOTE — Progress Notes (Signed)
   CC: hospital follow-up multifocal pneumonia   HPI:  Nicholas Rice is a 64 y.o. with PMH as below.   Please see A&P for assessment of the patient's acute and chronic medical conditions.   Recently admitted for multifocal pneumonia and discharged on home supplemental oxygen. On ambulating into the clinic he checked his oxygen twice and was at 93% and 95%. He has been using the oxygen at home, which has been helping. He has no smoking history. He is asking for nebulizer as this was never delivered although all the parts that go with it were. He states the albuterol has helped his breathing significantly while admitted to the hospital.   Lower extremity swelling completed resolved. He is using his compression stockings daily. He is also on lasix and HCTZ for hypertension and HFpEF  Unable to pick up lantus 160 bid due to expense. He states he contacted the manufacturer directly, and can obtain lantus for $60 per month although will not be able to start receiving the medication until 10/22. He has been taking previous novolin 160 mg bid instead without symptoms of hypoglycemia. He checks glucose prior to taking medications and states this has been around 250. He has one dose of steroids left for treatment of gout flare.   His elbow pain has not recurred but is having muscle cramps and muscle quivering, which he states has been chronic in the past.   Social History: Stopped smoking 18 years ago Lives in [place]  Past Medical History:  Diagnosis Date  . Anemia   . Benign prostatic hyperplasia   . CAD in native artery   . Depression   . Diabetic neuropathy ([place])   . Diastolic heart failure ([place])   . Edema   . Enlarged prostate   . GERD (gastroesophageal reflux disease)   . Gout   . Hyperlipidemia   . Hypertension   . Hypogonadism in male   . Muscle cramp   . Osteoarthritis   . Pancytopenia ([place]) 01/02/2015  . Peripheral neuropathy   . 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 Renal insufficiency   . Stage 3 chronic kidney disease   . Type 2 diabetes with complication (HCC)    Review of Systems:   10 point ROS negative except as noted in HPI  Physical Exam:   Constitution: NAD, appears stated age 1: RRR, no m/r/g, no LE edema  Respiratory: CTA, no w/r/r Abdominal: NTTP, soft, non-distended, +BS Neuro: normal affect, a&ox3, +muscle fasciculations  Skin: c/d/i   Vitals:   06/18/20 1436  BP: (!) 143/61  Pulse: 93  Temp: 97.9 F (36.6 C)  TempSrc: Oral  SpO2: 97%  Weight: 249 lb 9.6 oz (113.2 kg)  Height: 5\' 10"  (1.778 m)     Assessment & Plan:   See Encounters Tab for problem based charting.  Patient discussed with Dr. Daryll Drown

## 2020-06-19 NOTE — Telephone Encounter (Signed)
Patient was seen as a Hospital follow-up on 06-18-2020 by Dr. Sharon Seller, when patient was discharge he received medical equipment for  oxygen, concentator, hose and etc for a nebulizer machine.The patient got all the equipment but no nebulizer machine.An order was placed and a community message sent to [employer] about this.

## 2020-06-20 ENCOUNTER — Telehealth: Payer: Self-pay

## 2020-06-20 NOTE — Telephone Encounter (Signed)
Mailed patient application for [employer], and Lantus or Toujeo/[employer] medication assistance today.

## 2020-06-23 ENCOUNTER — Encounter: Payer: Self-pay | Admitting: Internal Medicine

## 2020-06-23 ENCOUNTER — Other Ambulatory Visit: Payer: Self-pay

## 2020-06-23 ENCOUNTER — Ambulatory Visit (INDEPENDENT_AMBULATORY_CARE_PROVIDER_SITE_OTHER): Payer: Medicare HMO | Admitting: Internal Medicine

## 2020-06-23 ENCOUNTER — Other Ambulatory Visit: Payer: Medicare HMO

## 2020-06-23 ENCOUNTER — Ambulatory Visit (HOSPITAL_COMMUNITY)
Admission: RE | Admit: 2020-06-23 | Discharge: 2020-06-23 | Disposition: A | Discharge status: Home / Self Care | Payer: Medicare HMO | Source: Ambulatory Visit | Attending: Internal Medicine | Admitting: Internal Medicine

## 2020-06-23 VITALS — BP 132/47 | HR 95 | Temp 97.9°F | Wt 254.8 lb

## 2020-06-23 DIAGNOSIS — M25561 Pain in right knee: Secondary | ICD-10-CM | POA: Insufficient documentation

## 2020-06-23 DIAGNOSIS — M25562 Pain in left knee: Secondary | ICD-10-CM | POA: Insufficient documentation

## 2020-06-23 DIAGNOSIS — M1129 Other chondrocalcinosis, multiple sites: Secondary | ICD-10-CM | POA: Diagnosis not present

## 2020-06-23 DIAGNOSIS — G8929 Other chronic pain: Secondary | ICD-10-CM

## 2020-06-23 DIAGNOSIS — I10 Essential (primary) hypertension: Secondary | ICD-10-CM

## 2020-06-23 DIAGNOSIS — N1832 Chronic kidney disease, stage 3b: Secondary | ICD-10-CM | POA: Diagnosis not present

## 2020-06-23 DIAGNOSIS — M1189 Other specified crystal arthropathies, multiple sites: Secondary | ICD-10-CM

## 2020-06-23 DIAGNOSIS — E875 Hyperkalemia: Secondary | ICD-10-CM

## 2020-06-23 DIAGNOSIS — I129 Hypertensive chronic kidney disease with stage 1 through stage 4 chronic kidney disease, or unspecified chronic kidney disease: Secondary | ICD-10-CM

## 2020-06-23 DIAGNOSIS — M2559 Pain in other specified joint: Secondary | ICD-10-CM

## 2020-06-23 DIAGNOSIS — I7090 Unspecified atherosclerosis: Secondary | ICD-10-CM | POA: Diagnosis not present

## 2020-06-23 IMAGING — DX DG KNEE 1-2V*L*
2 series · 2 of 2 positions shown · non-contrast
Comparison: None.

CLINICAL DATA: Chronic bilateral knee pain.

EXAM:
LEFT KNEE - 1-2 VIEW

[t knee ap left]
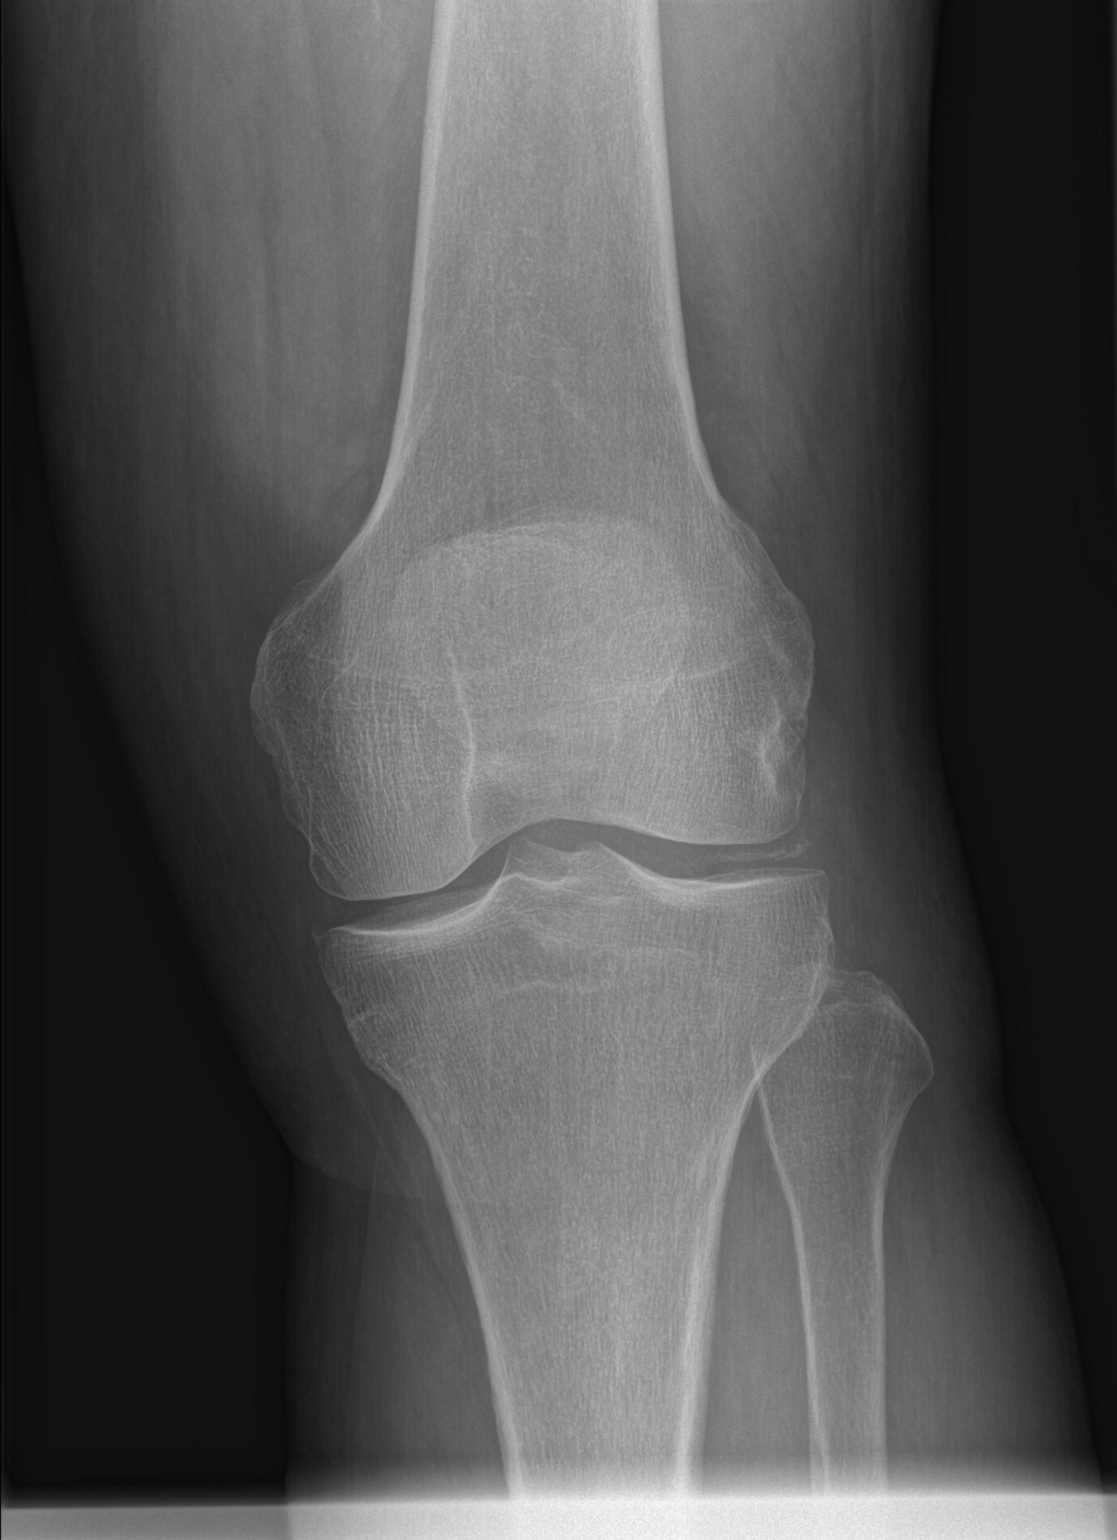

[t knee lat left]
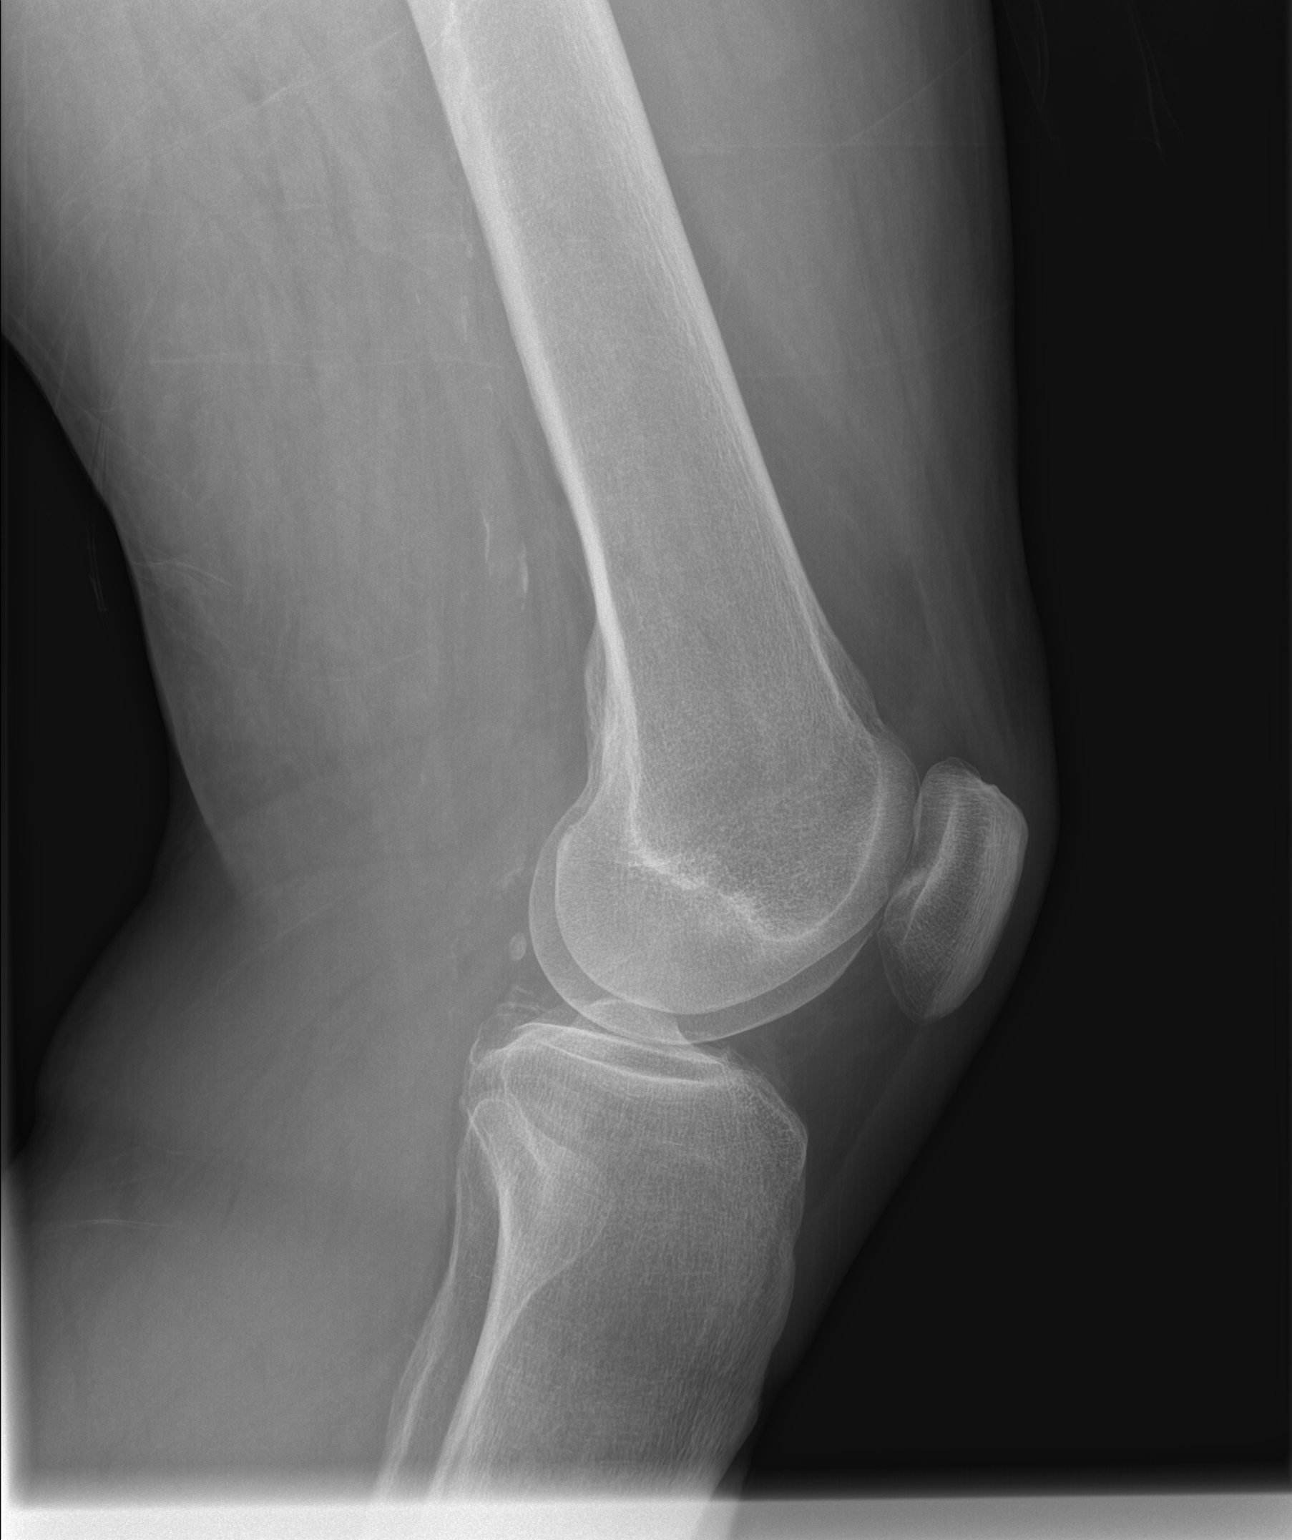

[2 of 2 positions shown; findings below may reference images not displayed]

FINDINGS: No acute fracture, dislocation, or knee joint effusion is
identified. There is mild chondrocalcinosis in the medial and
lateral compartments with at most mild medial compartment joint
space narrowing. No destructive osseous process is evident.
Atherosclerotic vascular calcification is noted.
IMPRESSION: Chondrocalcinosis without evidence of acute osseous abnormality.

## 2020-06-23 IMAGING — DX DG KNEE 1-2V*R*
2 series · 2 of 2 positions shown · non-contrast
Comparison: [DATE]

CLINICAL DATA: Chronic bilateral knee pain.

EXAM:
RIGHT KNEE - 1-2 VIEW

[t knee ap right]
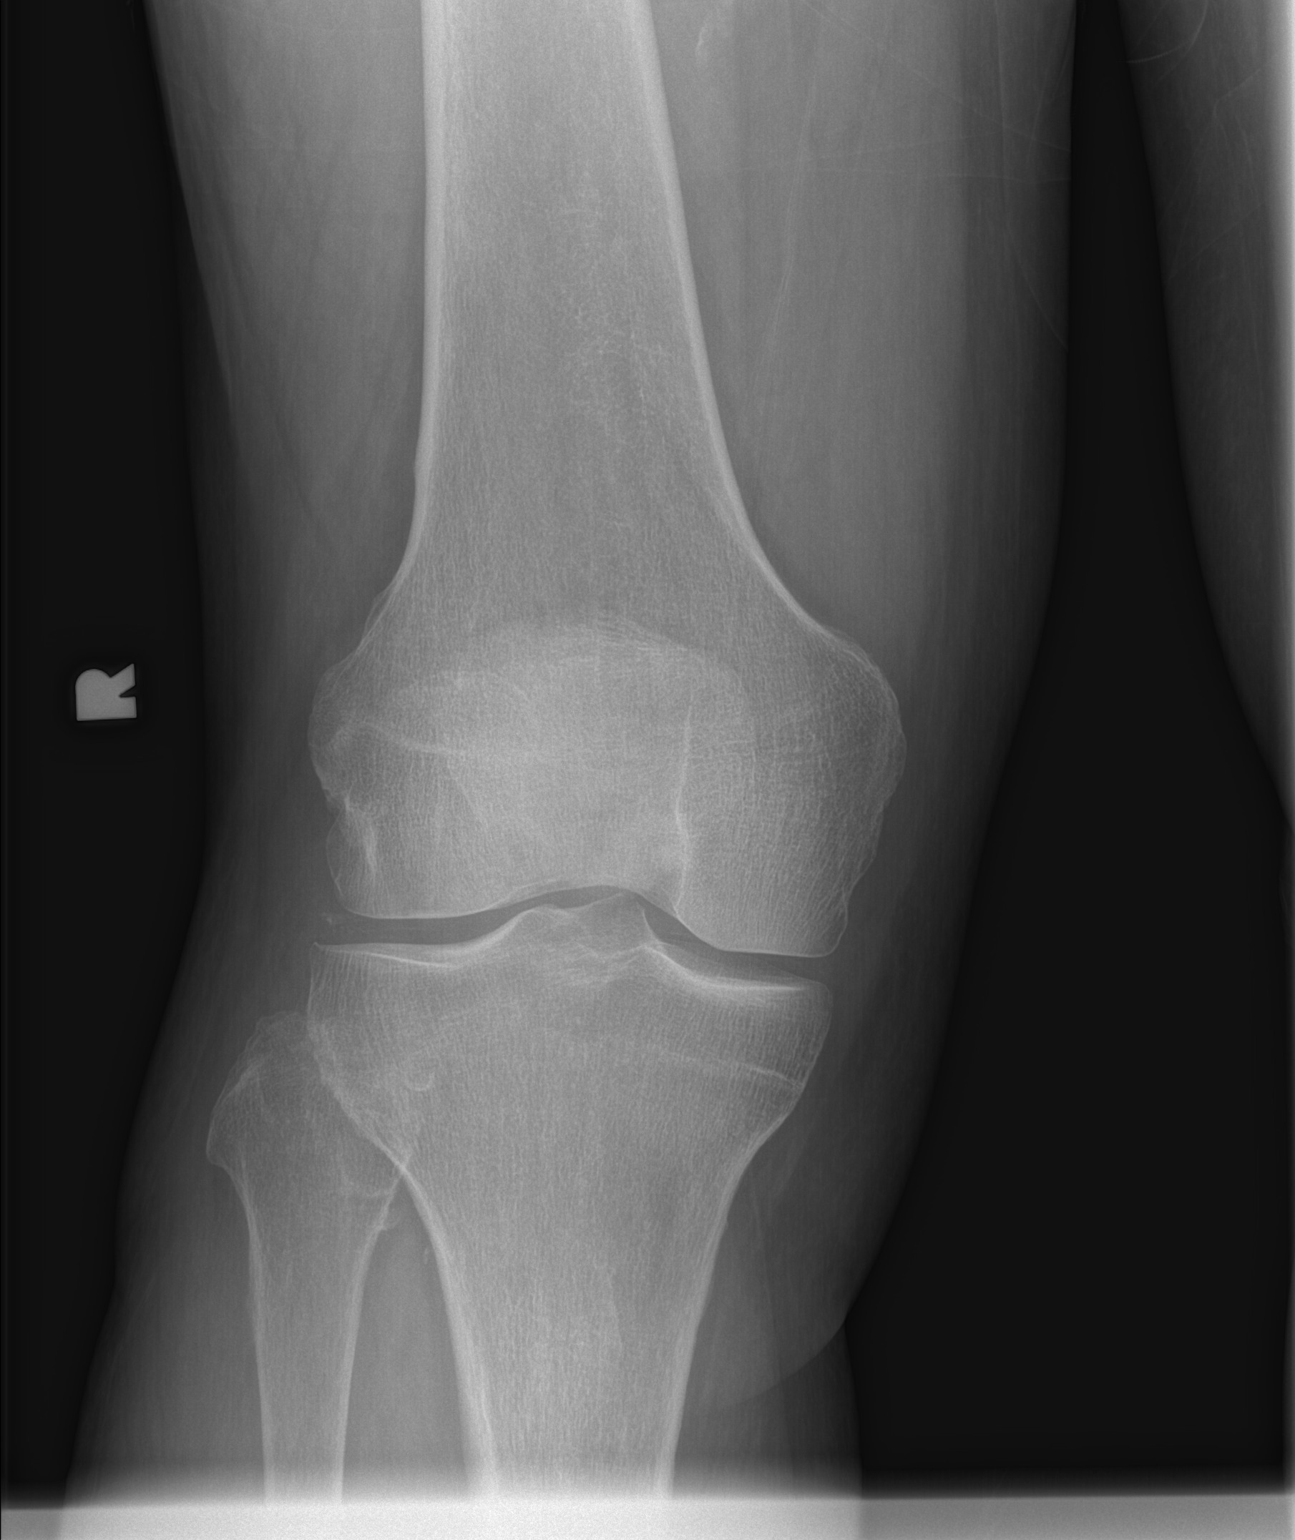

[t knee lat right]
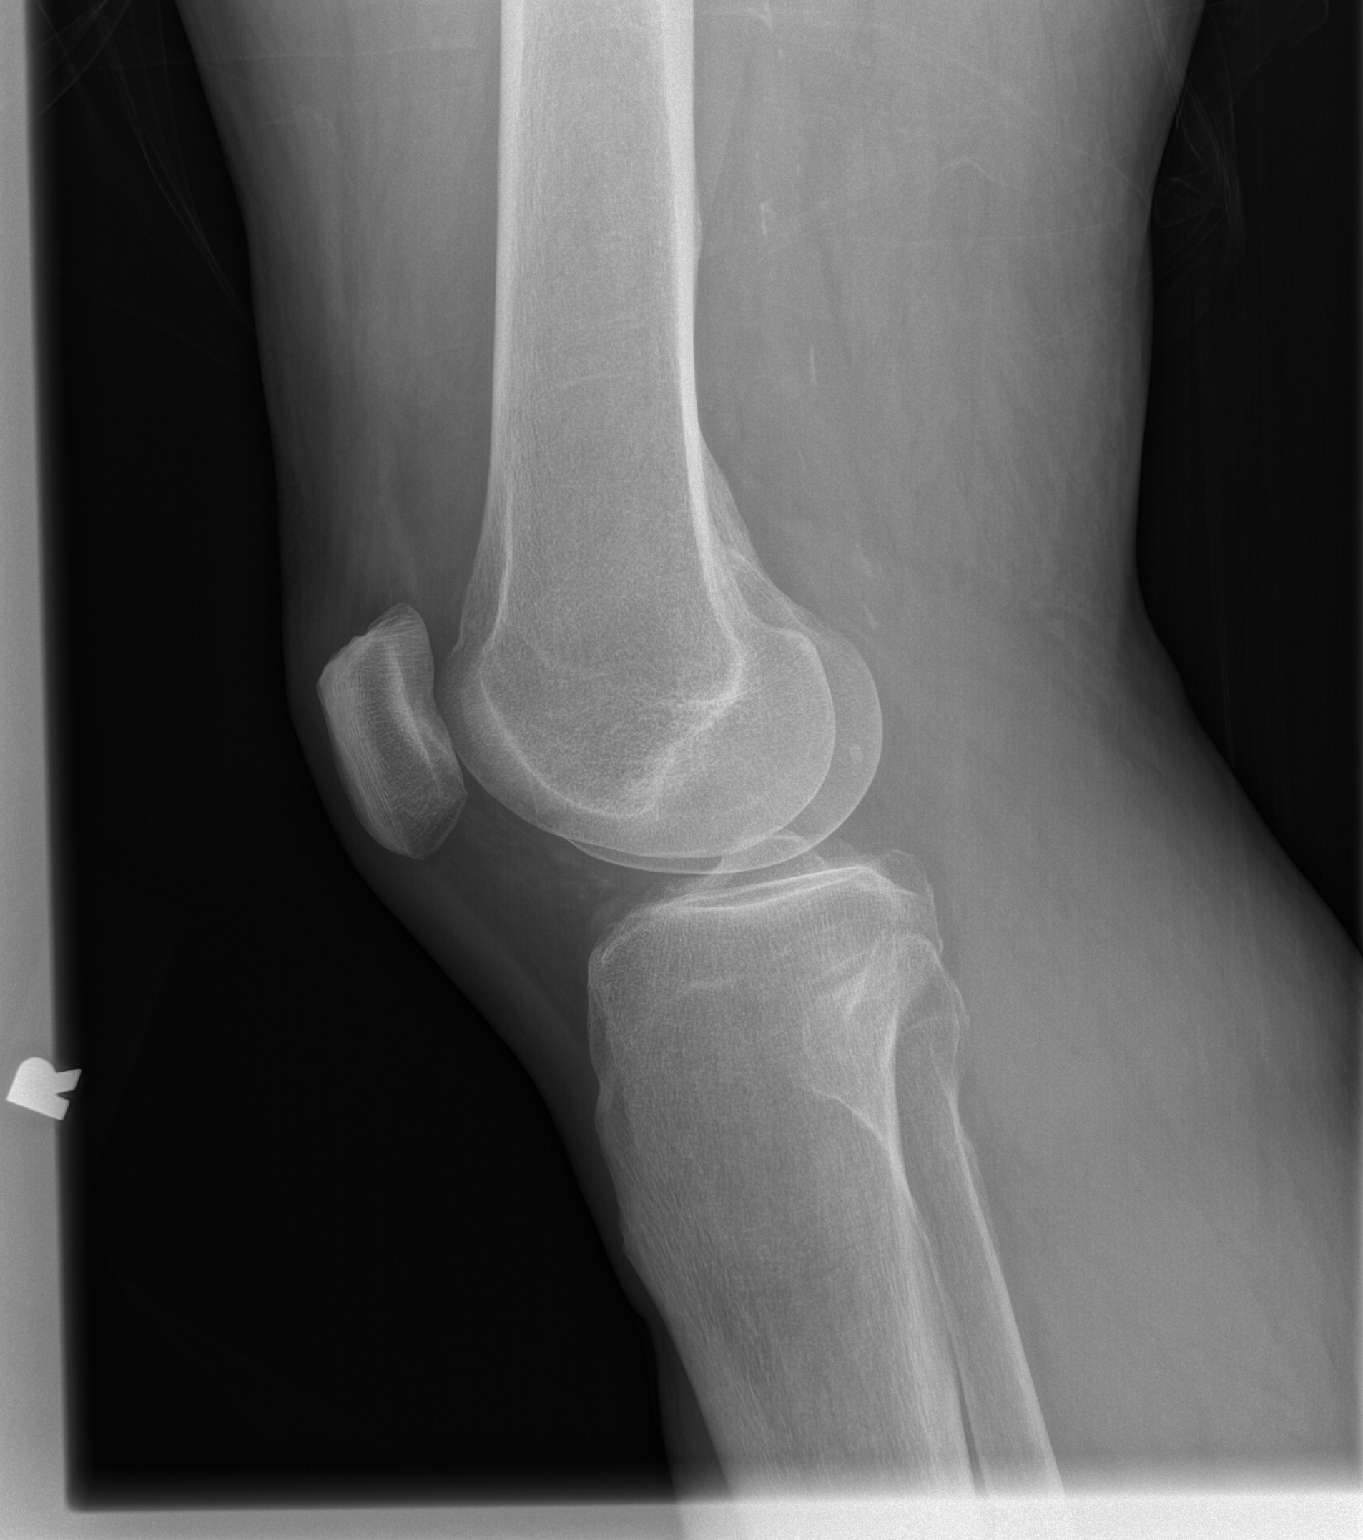

[2 of 2 positions shown; findings below may reference images not displayed]

FINDINGS: No acute fracture, dislocation, or knee joint effusion is
identified. There is mild chondrocalcinosis in the lateral
compartment, and there may also be faint medial compartment
chondrocalcinosis. No destructive osseous process is evident.
Femorotibial joint space widths are preserved. Atherosclerotic
vascular calcifications are noted.
IMPRESSION: Mild chondrocalcinosis without evidence of acute osseous
abnormality.

## 2020-06-23 MED ORDER — COLCHICINE 0.6 MG PO TABS: 0.6000 mg | ORAL_TABLET | Freq: Two times a day (BID) | ORAL | 5 refills | Status: AC

## 2020-06-23 NOTE — Patient Instructions (Signed)
Thank you for allowing [place] to provide your care today. Today we discussed your joint pain   I have ordered the following labs for you:    I will call if any are abnormal.    Today we made the following changes to your medications:   Please START taking  Colchicine .6mg  - take 1 tablet two times per day.   Please STOP taking   Allopurinol   Please follow-up on October 8th.    Please call the internal medicine center clinic if you have any questions or concerns, we may be able to help and keep you from a long and expensive emergency room wait. Our clinic and after hours phone number is [phone], the best time to call is Monday through Friday 9 am to 4 pm but there is always someone available 24/7 if you have an emergency. If you need medication refills please notify your pharmacy one week in advance and they will send [place] a request.

## 2020-06-23 NOTE — Telephone Encounter (Signed)
Glynis Smiles, [place], [place]; Skeet Latch; [place], [place]; [place], Leory Plowman; 2 others   This has already been taken care of.   Thanks

## 2020-06-23 NOTE — Progress Notes (Signed)
   CC:   HPI:  [name] is a 64 y.o. with PMH as below.   Please see A&P for assessment of the patient's acute and chronic medical conditions.  Past Medical History:  Diagnosis Date  . Anemia   . Benign prostatic hyperplasia   . CAD in native artery   . Depression   . Diabetic neuropathy ([place])   . Diastolic heart failure ([place])   . Edema   . Enlarged prostate   . GERD (gastroesophageal reflux disease)   . Gout   . Hyperlipidemia   . Hypertension   . Hypogonadism in male   . Muscle cramp   . Osteoarthritis   . Pancytopenia ([place]) 01/02/2015  . Peripheral neuropathy   . Proteinuria due to type 2 diabetes mellitus ([place])   . Renal insufficiency   . Stage 3 chronic kidney disease   . Type 2 diabetes with complication (HCC)    Review of Systems:   Review of Systems  Constitutional: Negative for chills, fever, malaise/fatigue and weight loss.  Respiratory: Negative for cough and shortness of breath.   Cardiovascular: Negative for chest pain and leg swelling.  Genitourinary: Negative for dysuria, frequency and urgency.  Musculoskeletal: Positive for back pain, joint pain and myalgias. Negative for falls and neck pain.  Neurological: Negative for dizziness, tremors, sensory change, speech change and weakness.   Physical Exam:   Constitution: NAD, appears stated age 10: RRR, no m/r/g, no LE edema  Respiratory: CTA, no w/r/r MSK: full active and passive ROM elbow and knees, no joint swelling or deformity, no erythema warmth large joints Neuro: normal affect, a&ox3 Skin: scattered scaly plaques face and arms    Vitals:   06/23/20 1545  BP: (!) 132/47  Pulse: 95  Temp: 97.9 F (36.6 C)  TempSrc: Oral  SpO2: 94%  Weight: 254 lb 12.8 oz (115.6 kg)    Assessment & Plan:   See Encounters Tab for problem based charting.  Patient discussed with Dr. Jimmye Norman

## 2020-06-24 DIAGNOSIS — E875 Hyperkalemia: Secondary | ICD-10-CM | POA: Insufficient documentation

## 2020-06-24 HISTORY — DX: Hyperkalemia: E87.5

## 2020-06-24 LAB — BMP8+ANION GAP
Anion Gap: 13 mmol/L (ref 10.0–18.0)
BUN/Creatinine Ratio: 35 — ABNORMAL HIGH (ref 10–24)
BUN: 56 mg/dL — ABNORMAL HIGH (ref 8–27)
CO2: 19 mmol/L — ABNORMAL LOW (ref 20–29)
Calcium: 9.1 mg/dL (ref 8.6–10.2)
Chloride: 103 mmol/L (ref 96–106)
Creatinine, Ser: 1.61 mg/dL — ABNORMAL HIGH (ref 0.76–1.27)
GFR calc Af Amer: 51 mL/min/{1.73_m2} — ABNORMAL LOW (ref >59)
GFR calc non Af Amer: 44 mL/min/{1.73_m2} — ABNORMAL LOW (ref >59)
Glucose: 197 mg/dL — ABNORMAL HIGH (ref 65–99)
Potassium: 5.8 mmol/L — ABNORMAL HIGH (ref 3.5–5.2)
Sodium: 135 mmol/L (ref 134–144)

## 2020-06-24 MED ORDER — HYDROCHLOROTHIAZIDE 12.5 MG PO TABS: 12.5000 mg | ORAL_TABLET | Freq: Every day | ORAL | 5 refills | Status: DC

## 2020-06-24 MED ORDER — LOKELMA 10 G PO PACK: 10.0000 g | PACK | Freq: Every day | ORAL | 0 refills | Status: AC

## 2020-06-24 NOTE — Assessment & Plan Note (Addendum)
See "ckd stage III"

## 2020-06-24 NOTE — Progress Notes (Signed)
Internal Medicine Clinic Attending  Case discussed with Dr. Seawell  At the time of the visit.  We reviewed the resident's history and exam and pertinent patient test results.  I agree with the assessment, diagnosis, and plan of care documented in the resident's note.  

## 2020-06-24 NOTE — Assessment & Plan Note (Addendum)
Here for recheck of potassium, which was 5.5 on 9/22. He is urinating well, has not noticed increased lower extremity swelling. He has not been able to weigh himself.   ADDENDUM: Potassium persistently elevated, 5.8 today, possibly due to CKD and reduced aldosterone secretion with lisinopril. He also has worsening renal function today. Renal function also worse.   - stop lisinopril, continue HCTZ - lokelma 10 g three days  - f/u on 5 days for repeat BMP  - discussed with patient, and repeated back

## 2020-06-24 NOTE — Assessment & Plan Note (Signed)
See CKD Stage III

## 2020-06-24 NOTE — Assessment & Plan Note (Signed)
Since stopping prednisone he has had increased pain in his right elbow and both lower knees. He also has diffuse joint pain but notes these are what hurt most. He states he has had these pains for years, but is not sure what causes them. The pain get worse with activity throughout the day.  No swelling, erythema, or warmth. Full active and passive ROM.  Looking over his chart, he has seen rheum in the past due to +RF in 2016. LDH, copper, anti-ccp, esr, and crp were all normal. He had thrombocytopenia at the time and blood smear showed morphologically normal leuks with moderate thrombocytopenia.  With his symptoms of larger joint pain that is more diffuse and less extreme tenderness, gout is lower on the differential although uric acid was slightly elevated. RA is still possible although symptoms less consistent with worsening throughout the day. X-ray of the right elbow showed calcification. RF is known to be elevated in pseudogout and anti-ccp was negative in the past.    - stop allopurinol, start colchicine .6 mg bid  - xr left and right knee

## 2020-06-25 NOTE — Telephone Encounter (Addendum)
Patient called in regarding patient assistance for Lantus. Explained that [employer] on 04/15/9105. Patient states he has not received them. He is advised to call back to speak to [facility] tomorrow if he does not receive them today. Patient is very Patent attorney. Does state he is taking the NPH at 160 units BID and Toujeo. Hubbard Hartshorn, BSN, RN-BC

## 2020-06-27 ENCOUNTER — Telehealth: Payer: Self-pay

## 2020-06-27 NOTE — Telephone Encounter (Signed)
Received message from [facility] at front desk that Nicholas Rice returned my call.   TC to patient, VM obtained and message left to call triage back [place], RN,BSN

## 2020-06-27 NOTE — Telephone Encounter (Signed)
Received TC from [place], [place] at [facility] who reports patient's b/p this morning was:  118/42 (at rest) 114/36 (after ambulation)  P- 106 She states patient was asymptomatic.  She has left the home.   Per chart , pt was seen in office on 06/23/20 and b/p in office was 132/47.  It was noted in office notes he was to stop his lisinopril (also noted to start lokelma).  PT at [facility] states she does not know anything about his medications.  TC to patient, VM obtained and message left for patient to call back nurse triage. Will forward to red team to make aware. SChaplin, RN,BSN

## 2020-06-27 NOTE — Telephone Encounter (Signed)
TC to patient.  He states he is "feeling good and getting stronger by the day".  He denies any symptoms of hypotension and confirms he stopped taking his lisinopril.  He also states he started taking his lokelma last night.  Pt states he has an appt on 07/04/20 w/ endocrinology and will be able to come be the [facility] lab afterwards for bloodwork.  Will forward to red team/Dr. Marianna Payment to advise since pt just started [facility] last night and was advised to have repeat BMP in 5 days per LOV note.  Thank you, SChaplin, RN,BSN

## 2020-06-30 NOTE — Telephone Encounter (Signed)
TC to patient, Lab only appt made for tomorrow, 07/01/20 at 1400 per Dr. Marianna Payment.  Pt verbalized understanding. SChaplin, RN,BSN

## 2020-06-30 NOTE — Telephone Encounter (Signed)
Hello,   Can we please schedule this patient for a follow up tomorrow to repeat his labs. He just needs a BMP. The order is already in.   Thank you,  Marianna Payment

## 2020-06-30 NOTE — Telephone Encounter (Signed)
Pt has been sch for 07/01/2020 for his labs.

## 2020-07-01 ENCOUNTER — Encounter: Payer: Medicare HMO | Admitting: Dietician

## 2020-07-01 ENCOUNTER — Other Ambulatory Visit: Payer: Self-pay

## 2020-07-01 ENCOUNTER — Other Ambulatory Visit: Payer: Medicare HMO

## 2020-07-01 ENCOUNTER — Encounter: Payer: Self-pay | Admitting: Internal Medicine

## 2020-07-01 ENCOUNTER — Ambulatory Visit (INDEPENDENT_AMBULATORY_CARE_PROVIDER_SITE_OTHER): Payer: Medicare HMO | Admitting: Internal Medicine

## 2020-07-01 VITALS — BP 136/73 | Temp 97.9°F | Ht 70.0 in | Wt 258.7 lb

## 2020-07-01 DIAGNOSIS — I10 Essential (primary) hypertension: Secondary | ICD-10-CM

## 2020-07-01 DIAGNOSIS — I872 Venous insufficiency (chronic) (peripheral): Secondary | ICD-10-CM | POA: Diagnosis not present

## 2020-07-01 DIAGNOSIS — E875 Hyperkalemia: Secondary | ICD-10-CM

## 2020-07-01 DIAGNOSIS — E118 Type 2 diabetes mellitus with unspecified complications: Secondary | ICD-10-CM

## 2020-07-01 LAB — BASIC METABOLIC PANEL
Anion gap: 12 (ref 5–15)
BUN: 23 mg/dL (ref 8–23)
CO2: 21 mmol/L — ABNORMAL LOW (ref 22–32)
Calcium: 8.5 mg/dL — ABNORMAL LOW (ref 8.9–10.3)
Chloride: 101 mmol/L (ref 98–111)
Creatinine, Ser: 1.34 mg/dL — ABNORMAL HIGH (ref 0.61–1.24)
GFR calc non Af Amer: 55 mL/min — ABNORMAL LOW (ref >60)
Glucose, Bld: 269 mg/dL — ABNORMAL HIGH (ref 70–99)
Potassium: 4 mmol/L (ref 3.5–5.1)
Sodium: 134 mmol/L — ABNORMAL LOW (ref 135–145)

## 2020-07-01 MED ORDER — METOPROLOL SUCCINATE ER 25 MG PO TB24: 25.0000 mg | ORAL_TABLET | Freq: Every day | ORAL | 11 refills | Status: DC

## 2020-07-01 MED ORDER — TRESIBA FLEXTOUCH 200 UNIT/ML ~~LOC~~ SOPN: 145.0000 [IU] | PEN_INJECTOR | Freq: Every day | SUBCUTANEOUS | 0 refills | Status: DC

## 2020-07-01 MED ORDER — FIASP 100 UNIT/ML ~~LOC~~ SOLN: 45.0000 [IU] | Freq: Three times a day (TID) | SUBCUTANEOUS | 0 refills | Status: DC

## 2020-07-01 NOTE — Patient Instructions (Signed)
Thank you, [name] for allowing [place] to provide your care today. Today we discussed Diabetes and lower extremity swelling.    I have ordered the following labs for you:   Lab Orders     BMP w Anion Gap (STAT/Sunquest-performed on-site)   I will call if any are abnormal. All of your labs can be accessed through "My Chart".  I have place a referrals to none.  I have ordered the following tests: none    I have ordered the following medication/changed the following medications:  1. discontinue Hydrochlorothiazide 2. begin Metoprolol 25 mg daily 3. begin Tresiba 145 units at bedtime and FIASP 45 units with meals three times daily.  Please follow-up in in 2 weeks.  Should you have any questions or concerns please call the internal medicine clinic at [phone].    Nicholas Rice, D.O. [facility]   My Chart Access: [email or web address]   If you have not already done so, please get your COVID 19 vaccine  To schedule an appointment for a COVID vaccine choice any of the following: Go to [email or web address]   Go to [email or web address]                  Call [phone]                                     Call [phone] and select Option 2

## 2020-07-01 NOTE — Progress Notes (Signed)
CC: Diabetes  HPI:  Nicholas Rice is a 64 y.o. male with a past medical history stated below and presents today for diabetes and LE edema. Please see problem based assessment and plan for additional details.  Past Medical History:  Diagnosis Date  . Anemia   . Benign prostatic hyperplasia   . CAD in native artery   . Depression   . Diabetic neuropathy ([place])   . Diastolic heart failure ([place])   . Edema   . Enlarged prostate   . GERD (gastroesophageal reflux disease)   . Gout   . Hyperlipidemia   . Hypertension   . Hypogonadism in male   . Muscle cramp   . Osteoarthritis   . Pancytopenia ([place]) 01/02/2015  . Peripheral neuropathy   . 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 Renal insufficiency   . Stage 3 chronic kidney disease ([place])   . Type 2 diabetes with complication [facility])     Current Outpatient Medications on File Prior to Visit  Medication Sig Dispense Refill  . ACCU-CHEK GUIDE test strip     . Accu-Chek Softclix Lancets lancets     . albuterol (PROVENTIL) (2.5 MG/3ML) 0.083% nebulizer solution Take 3 mLs (2.5 mg total) by nebulization every 4 (four) hours as needed for wheezing or shortness of breath. 75 mL 2  . amLODipine (NORVASC) 5 MG tablet Take 5 mg by mouth daily.     . APPLE CIDER VINEGAR PO Take 1 tablet by mouth daily.     Marland Kitchen atorvastatin (LIPITOR) 40 MG tablet Take 40 mg by mouth daily.    Marland Kitchen b complex vitamins capsule Take 1 capsule by mouth daily.    . Blood Glucose Monitoring Suppl (ACCU-CHEK GUIDE) w/Device KIT     . colchicine 0.6 MG tablet Take 1 tablet (0.6 mg total) by mouth 2 (two) times daily. 60 tablet 5  . ferrous sulfate 325 (65 FE) MG EC tablet Take 325 mg by mouth. One a day.    . furosemide (LASIX) 40 MG tablet Take 40 mg by mouth in the morning, at noon, and at bedtime.     . gabapentin (NEURONTIN) 600 MG tablet Take 600 mg by mouth at bedtime.     . magnesium oxide (MAG-OX) 400 MG tablet Take 400 mg by mouth daily.     .  meloxicam (MOBIC) 15 MG tablet Take 15 mg by mouth daily.     . Misc Natural Products (JOINT HEALTH PO) Take 1 tablet by mouth daily.     . pantoprazole (PROTONIX) 40 MG tablet Take 40 mg by mouth daily.    . sertraline (ZOLOFT) 100 MG tablet Take 200 mg by mouth daily.     . tamsulosin (FLOMAX) 0.4 MG CAPS capsule Take 0.4 mg by mouth daily.    . traMADol (ULTRAM) 50 MG tablet Take 50 mg by mouth 4 (four) times daily as needed for moderate pain or severe pain.     . Vitamin D, Ergocalciferol, (DRISDOL) 1.25 MG (50000 UNIT) CAPS capsule Take 50,000 Units by mouth every 7 (seven) days.     No current facility-administered medications on file prior to visit.    Family History  Problem Relation Age of Onset  . Diabetes Mother   . Emphysema Mother   . Congestive Heart Failure Mother   . Diabetes Father   . Heart disease Father   . Heart attack Father     Social History   Socioeconomic History  .  Marital status: Single    Spouse name: Not on file  . Number of children: Not on file  . Years of education: Not on file  . Highest education level: Not on file  Occupational History  . Not on file  Tobacco Use  . Smoking status: Former Research scientist (life sciences)  . Smokeless tobacco: Never Used  Substance and Sexual Activity  . Alcohol use: Never  . Drug use: Never  . Sexual activity: Not on file  Other Topics Concern  . Not on file  Social History Narrative  . Not on file   Social Determinants of Health   Financial Resource Strain:   . Difficulty of Paying Living Expenses: Not on file  Food Insecurity:   . Worried About Charity fundraiser in the Last Year: Not on file  . Ran Out of Food in the Last Year: Not on file  Transportation Needs:   . Lack of Transportation (Medical): Not on file  . Lack of Transportation (Non-Medical): Not on file  Physical Activity:   . Days of Exercise per Week: Not on file  . Minutes of Exercise per Session: Not on file  Stress:   . Feeling of Stress : Not on  file  Social Connections:   . Frequency of Communication with Friends and Family: Not on file  . Frequency of Social Gatherings with Friends and Family: Not on file  . Attends Religious Services: Not on file  . Active Member of Clubs or Organizations: Not on file  . Attends Archivist Meetings: Not on file  . Marital Status: Not on file  Intimate Partner Violence:   . Fear of Current or Ex-Partner: Not on file  . Emotionally Abused: Not on file  . Physically Abused: Not on file  . Sexually Abused: Not on file    Review of Systems: ROS negative except for what is noted on the assessment and plan.  Vitals:   07/01/20 1340  BP: 136/73  Temp: 97.9 F (36.6 C)  TempSrc: Oral  SpO2: 98%  Weight: 258 lb 11.2 oz (117.3 kg)  Height: _0  (1.778 m)     Physical Exam: Physical Exam Constitutional:      Appearance: Normal appearance.  HENT:     Head: Normocephalic and atraumatic.  Eyes:     Extraocular Movements: Extraocular movements intact.  Cardiovascular:     Rate and Rhythm: Normal rate.     Pulses: Normal pulses.     Heart sounds: Normal heart sounds.  Pulmonary:     Effort: Pulmonary effort is normal.     Breath sounds: Normal breath sounds.  Musculoskeletal:        General: Normal range of motion.     Cervical back: Normal range of motion.     Right lower leg: Edema present.     Left lower leg: Edema present.     Comments: Patient has minimal lower extremity edema with 1+ pitting edema to the knees.  This is substantially improved from prior exams.  There is no evidence of skin breakdown or wounds present at this time.  Skin:    General: Skin is warm and dry.  Neurological:     Mental Status: He is alert and oriented to person, place, and time. Mental status is at baseline.  Psychiatric:        Mood and Affect: Mood normal.      Assessment & Plan:   See Encounters Tab for problem based charting.  Patient seen  with Dr. Hilbert Odor,  D.O. [facility], PGY-2 Pager: [phone], Phone: [phone] Date 07/03/2020 Time 6:17 AM

## 2020-07-01 NOTE — Progress Notes (Signed)
DOS 06/23/20:  Internal Medicine Clinic Attending  Case discussed with Dr. Sharon Seller  At the time of the visit.  We reviewed the resident's history and exam and pertinent patient test results.  I agree with the assessment, diagnosis, and plan of care documented in the resident's note.

## 2020-07-02 ENCOUNTER — Ambulatory Visit: Payer: Self-pay | Admitting: Endocrinology

## 2020-07-02 ENCOUNTER — Encounter: Payer: Self-pay | Admitting: Endocrinology

## 2020-07-03 ENCOUNTER — Encounter: Payer: Self-pay | Admitting: Internal Medicine

## 2020-07-03 NOTE — Assessment & Plan Note (Signed)
Patient has a history of type 2 diabetes with significant insulin resistance.  Patient was previously on Levemir 160 units twice daily for glycemic control.  Patient states that he is almost completely run out of this medication will need refills.  We will start him on Tresiba 145 units daily at bedtime and FIASP 45 units 3 times daily with meals.  Patient will likely need further adjustments to his diabetic medications in the near future.  I will reassess his tolerance to this at his follow-up appointment and a week and a half.  Plan: -Start Tresiba 145 units daily at bedtime and FIASP 45 units 3 times daily with meals.

## 2020-07-03 NOTE — Assessment & Plan Note (Addendum)
Patient continues to have lower extremity edema, but significantly improved since being discharged form the hospital.  Patient has lower extremity TED hose in place and states that he has been using these consistently.  Patient may benefit from reapplication of Unna boots in the future.  We will hold off on this for now.   Notably, patient's mobility has improved since his lower extremity edema has been better controlled.  I am hopeful that his progress will continue as we managed his other comorbidities more closely.

## 2020-07-03 NOTE — Progress Notes (Signed)
DOS 07/01/20 Internal Medicine Clinic Attending  Case discussed with Dr. Marianna Payment  At the time of the visit.  We reviewed the resident's history and exam and pertinent patient test results.  I agree with the assessment, diagnosis, and plan of care documented in the resident's note.

## 2020-07-03 NOTE — Assessment & Plan Note (Signed)
Patient has a history of essential hypertension on hydrochlorothiazide 12.5 mg daily and amlodipine 5 mg daily.  His blood pressure today is 136/73.  He states that he is tolerating his medications well.  In the hospital he did have a significant history of polyarticular joint pain concerning for gout.  This is likely precipitated by his higher doses of diuretics such as furosemide and hydrochlorothiazide.  Therefore I will discontinue his hydrochlorothiazide today and start him on metoprolol succinate 25 mg daily.  Patient has a follow-up appointment in roughly a week and a half.  I will reevaluate his blood pressure at that time.   Plan: -Discontinue hydrochlorothiazide 12.5 mg -Start metoprolol succinate 25 mg daily

## 2020-07-04 ENCOUNTER — Encounter: Payer: Self-pay | Admitting: Dietician

## 2020-07-04 ENCOUNTER — Other Ambulatory Visit: Payer: Self-pay

## 2020-07-04 ENCOUNTER — Telehealth: Payer: Self-pay | Admitting: Dietician

## 2020-07-04 ENCOUNTER — Encounter: Payer: Self-pay | Admitting: Endocrinology

## 2020-07-04 ENCOUNTER — Ambulatory Visit (INDEPENDENT_AMBULATORY_CARE_PROVIDER_SITE_OTHER): Payer: Medicare HMO | Admitting: Endocrinology

## 2020-07-04 ENCOUNTER — Ambulatory Visit (INDEPENDENT_AMBULATORY_CARE_PROVIDER_SITE_OTHER): Payer: Medicare HMO | Admitting: Dietician

## 2020-07-04 VITALS — BP 172/84 | HR 98 | Ht 70.0 in | Wt 257.2 lb

## 2020-07-04 DIAGNOSIS — Z713 Dietary counseling and surveillance: Secondary | ICD-10-CM

## 2020-07-04 DIAGNOSIS — Z6837 Body mass index (BMI) 37.0-37.9, adult: Secondary | ICD-10-CM | POA: Diagnosis not present

## 2020-07-04 DIAGNOSIS — E118 Type 2 diabetes mellitus with unspecified complications: Secondary | ICD-10-CM

## 2020-07-04 LAB — POCT GLYCOSYLATED HEMOGLOBIN (HGB A1C): Hemoglobin A1C: 8.8 % — AB (ref 4.0–5.6)

## 2020-07-04 MED ORDER — TRESIBA FLEXTOUCH 200 UNIT/ML ~~LOC~~ SOPN: 130.0000 [IU] | PEN_INJECTOR | Freq: Every day | SUBCUTANEOUS | 0 refills | Status: DC

## 2020-07-04 MED ORDER — FIASP 100 UNIT/ML ~~LOC~~ SOLN: 60.0000 [IU] | Freq: Three times a day (TID) | SUBCUTANEOUS | 0 refills | Status: DC

## 2020-07-04 NOTE — Patient Instructions (Addendum)
Thank you for your visit today!  We suggest that you check your blood sugar 4 times a day before meals and bedtime  aim for <130 before meals and < 180 after meals   aim for 45-60 grams of carb per meal   1 servings = ~ 15 grams of carb  Use the book we looked at that you have or read the label to find out how many carbs foods contain.   Higher carb foods like fruit, starches( bread & cereal), starchy vegetables(sweet potatoes)  or milk/dairy usually have ~ 15 grams carb/serving  11/2 cups of non starchy vegetables like green beans =~ 15 grams carb  take fiasp with meals  I put in an appointment with me at 8:15 AM on October 18th . Please call me if this will not work for you.   Butch Penny [phone]

## 2020-07-04 NOTE — Progress Notes (Signed)
Medical Nutrition Therapy:  Appt start time: 0915 end time:  1015 Total time: 60 Visit # 2  Assessment:  Primary concerns today: taking care of his diabetes.  Nicholas Rice is here for follow up for carb counting, personal Continuous glucose monitor. He brought his reader which was downloaded, but has already removed the sensor as it was no longer working. He feels the new insulin regimen is working better. fasting blood sugar the past two days 197 and 167 with a low of 65 the morning after starting the new regimen. He was not aware of the importance of carbohydrates or matching the amount to the meal time insulin. He is working on getting personal cgm sensor from [employer] and insulin from patient assistance. He states he has enough insulin to last until his next appointment. He is seeing endocrinology today. He is food and medicine insecure and does not have money to put gas in his vehicle and thus is having friends drive him for his appoinments. He reports his daughter intends to help with food.   ANTHROPOMETRICS: Estimated body mass index is 37.32 kg/m as calculated from the following:   Height as of 07/01/20: 5\' 10"  (1.778 m).   Weight as of this encounter: 260 lb 1.6 oz (118 kg).  WEIGHT HISTORY:  Wt Readings from Last 5 Encounters:  07/04/20 260 lb 1.6 oz (118 kg)  07/01/20 258 lb 11.2 oz (117.3 kg)  06/23/20 254 lb 12.8 oz (115.6 kg)  06/18/20 249 lb 9.6 oz (113.2 kg)  06/17/20 248 lb (112.5 kg)   SLEEP:11-5 am for  years MEDICATIONS: 145 Tresiba once a day , 45 units of FIASP with meals three times a day BLOOD SUGAR: cgm sensor downloaded and he checked post large breakfast today and it was 278  CGM Results from download:   % Time CGM active:   99 %   (Goal >70%)  Average glucose:   258 mg/dL for 14 days  Glucose management indicator:   9.5 % (~1.5 days on new insulin regimen  Time in range (70-180 mg/dL):   27 %   (Goal >70%)  Time High (181-250 mg/dL):   20 %   (Goal < 25%)   Time Very High (>250 mg/dL):    53 %   (Goal < 5%)  Time Low (54-69 mg/dL):   0 %   (Goal <4%)  Time Very Low (<54 mg/dL):   0 %   (Goal <1%)  Coefficient of variation:   40.0 %   (Goal <36%)    DIETARY INTAKE: Usual eating pattern includes 2 meals and 1-2 snacks per day.  Dining Out (times/week): 1x 24-hr recall:  Arises at 5-12  Checks blood sugar and takes insulin B ( AM): 2 fried egg sandwiches on sprouted lite bread and a sausage cheese croissant this am that his friend brought him L ( PM): green beans and dried beans D ( PM): 2 sweet potatoes with butter and stevia Beverages: diet coke, diet dr pepper, diet ginger ale and diet 7 up , homemade tea sweetened with stevia  Usual physical activity: walks but not sure how often and how long   Progress Towards Goal(s):  In progress.   Nutritional Diagnosis:  NB-1.1 Food and nutrition-related knowledge deficit As related to lack of sufficient prior knowledge of diabetes self management.  As evidenced by his report and eagerness to learn today.    Intervention:  Nutrition education about carb counting/consistency and meal planning for diabetes,  Continuous glucose monitoring and diabetes medicines. Action Goal: check blood sugar 4 times a day aim for 45-60 grams of carb per meal, take fiasp with meals  Outcome goal: improved knowledge of diabetes self management and blood sugars Coordination of care: discuss starting a GLP-1 to assist with weight and blood sugar control with his doctor's team, needs rx for diabetes supplies  Teaching Method Utilized: Visual, Auditory,Hands on Handouts given during visit include: carb counting and meal planning guide Barriers to learning/adherence to lifestyle change: material resources Demonstrated degree of understanding via:  Teach Back   Monitoring/Evaluation:  Dietary intake, exercise, reader, and body weight in 10 day(s)  Debera Lat, RD 07/04/2020 10:39 AM. .

## 2020-07-04 NOTE — Patient Instructions (Addendum)
Your blood pressure is high today.  Please see your primary care provider soon, to have it rechecked good diet and exercise significantly improve the control of your diabetes.  please let me know if you wish to be referred to a dietician.  high blood sugar is very risky to your health.  you should see an eye doctor and dentist every year.  It is very important to get all recommended vaccinations.  Controlling your blood pressure and cholesterol drastically reduces the damage diabetes does to your body.  Those who smoke should quit.  Please discuss these with your doctor.  For now, please change the insulin to the numbers listed below (however, please reduce the [place] first, then 2 days later, make the increase in [place]). Please come back for a follow-up appointment in 6 weeks.  Please see a diabetes educator the same day, to consider a pump.

## 2020-07-04 NOTE — Telephone Encounter (Signed)
Left message about diabetes supplies.

## 2020-07-04 NOTE — Progress Notes (Signed)
Subjective:    Patient ID: Nicholas Rice, male    DOB: 1956-02-01, 64 y.o.   MRN: [id number]  HPI pt is referred by Dr Marianna Payment, for diabetes.  Pt states DM was dx'ed in 7106; it is complicated by stage 3 CRI, PN, and CAD; he has been on insulin since 1996; pt says his diet and exercise are not good; he has never had pancreatic surgery.  Last episode of severe hypoglycemia was in 2017.  He has never had DKA.  He had pancreatitis in approx 2000 (he says cause was unknown).  He is retired.  He eats meals at 9AM, 2PM, and 8PM (but he sometimes eats just twice per day).  he seldom has hypoglycemia (this happens when he takes [place], but does not eat).  He is trying to get meds for pt assist, but he gets samples for now.  I reviewed continuous glucose monitor data.  Glucose varies from 70-400.  It is in general higher as the day goes on, but not necessarily so. Past Medical History:  Diagnosis Date  . Anemia   . Benign prostatic hyperplasia   . CAD in native artery   . Depression   . Diabetic neuropathy ([place])   . Diastolic heart failure ([place])   . Edema   . Enlarged prostate   . GERD (gastroesophageal reflux disease)   . Gout   . Hyperlipidemia   . Hypertension   . Hypogonadism in male   . Muscle cramp   . Osteoarthritis   . Pancytopenia ([place]) 01/02/2015  . Peripheral neuropathy   . 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 Renal insufficiency   . Stage 3 chronic kidney disease ([place])   . Type 2 diabetes with complication [facility])     Past Surgical History:  Procedure Laterality Date  . CATARACT EXTRACTION Bilateral   . CHOLECYSTECTOMY  1981  . [place]  . TONSILLECTOMY  1963    Social History   Socioeconomic History  . Marital status: Single    Spouse name: Not on file  . Number of children: Not on file  . Years of education: Not on file  . Highest education level: Not on file  Occupational History  . Not on file  Tobacco Use  . Smoking status: Former  Research scientist (life sciences)  . Smokeless tobacco: Never Used  Substance and Sexual Activity  . Alcohol use: Never  . Drug use: Never  . Sexual activity: Not on file  Other Topics Concern  . Not on file  Social History Narrative  . Not on file   Social Determinants of Health   Financial Resource Strain:   . Difficulty of Paying Living Expenses: Not on file  Food Insecurity:   . Worried About Charity fundraiser in the Last Year: Not on file  . Ran Out of Food in the Last Year: Not on file  Transportation Needs:   . Lack of Transportation (Medical): Not on file  . Lack of Transportation (Non-Medical): Not on file  Physical Activity:   . Days of Exercise per Week: Not on file  . Minutes of Exercise per Session: Not on file  Stress:   . Feeling of Stress : Not on file  Social Connections:   . Frequency of Communication with Friends and Family: Not on file  . Frequency of Social Gatherings with Friends and Family: Not on file  . Attends Religious Services: Not on file  . Active Member  of Clubs or Organizations: Not on file  . Attends Archivist Meetings: Not on file  . Marital Status: Not on file  Intimate Partner Violence:   . Fear of Current or Ex-Partner: Not on file  . Emotionally Abused: Not on file  . Physically Abused: Not on file  . Sexually Abused: Not on file    Current Outpatient Medications on File Prior to Visit  Medication Sig Dispense Refill  . ACCU-CHEK GUIDE test strip     . Accu-Chek Softclix Lancets lancets     . albuterol (PROVENTIL) (2.5 MG/3ML) 0.083% nebulizer solution Take 3 mLs (2.5 mg total) by nebulization every 4 (four) hours as needed for wheezing or shortness of breath. 75 mL 2  . amLODipine (NORVASC) 5 MG tablet Take 5 mg by mouth daily.     . APPLE CIDER VINEGAR PO Take 1 tablet by mouth daily.     Marland Kitchen atorvastatin (LIPITOR) 40 MG tablet Take 40 mg by mouth daily.    Marland Kitchen b complex vitamins capsule Take 1 capsule by mouth daily.    . Blood Glucose  Monitoring Suppl (ACCU-CHEK GUIDE) w/Device KIT     . colchicine 0.6 MG tablet Take 1 tablet (0.6 mg total) by mouth 2 (two) times daily. 60 tablet 5  . ferrous sulfate 325 (65 FE) MG EC tablet Take 325 mg by mouth. One a day.    . furosemide (LASIX) 40 MG tablet Take 40 mg by mouth in the morning, at noon, and at bedtime.     . gabapentin (NEURONTIN) 600 MG tablet Take 600 mg by mouth at bedtime.     . magnesium oxide (MAG-OX) 400 MG tablet Take 400 mg by mouth daily.     . meloxicam (MOBIC) 15 MG tablet Take 15 mg by mouth daily.     . metoprolol succinate (TOPROL XL) 25 MG 24 hr tablet Take 1 tablet (25 mg total) by mouth daily. 30 tablet 11  . Misc Natural Products (JOINT HEALTH PO) Take 1 tablet by mouth daily.     . pantoprazole (PROTONIX) 40 MG tablet Take 40 mg by mouth daily.    . sertraline (ZOLOFT) 100 MG tablet Take 200 mg by mouth daily.     . tamsulosin (FLOMAX) 0.4 MG CAPS capsule Take 0.4 mg by mouth daily.    . traMADol (ULTRAM) 50 MG tablet Take 50 mg by mouth 4 (four) times daily as needed for moderate pain or severe pain.     . Vitamin D, Ergocalciferol, (DRISDOL) 1.25 MG (50000 UNIT) CAPS capsule Take 50,000 Units by mouth every 7 (seven) days.     No current facility-administered medications on file prior to visit.    Allergies  Allergen Reactions  . Loratadine Rash    Family History  Problem Relation Age of Onset  . Diabetes Mother   . Emphysema Mother   . Congestive Heart Failure Mother   . Diabetes Father   . Heart disease Father   . Heart attack Father     BP (!) 172/84   Pulse 98   Ht '5\' 10"'  (1.778 m)   Wt 257 lb 4 oz (116.7 kg)   SpO2 96%   BMI 36.91 kg/m     Review of Systems denies weight loss, blurry vision, n/v, and depression.  He has doe and mild memory loss.       Objective:   Physical Exam VITAL SIGNS:  See vs page GENERAL: no distress Pulses: dorsalis pedis intact  bilat.   MSK: no deformity of the feet CV: 2+ bilat leg  edema Skin:  no ulcer on the feet.  normal color and temp on the feet. Neuro: sensation is intact to touch on the feet Ext: there is bilateral onychomycosis of the toenails.     Lab Results  Component Value Date   HGBA1C 8.8 (A) 07/04/2020   Lab Results  Component Value Date   CREATININE 1.34 (H) 07/01/2020   BUN 23 07/01/2020   NA 134 (L) 07/01/2020   K 4.0 07/01/2020   CL 101 07/01/2020   CO2 21 (L) 07/01/2020   I have reviewed outside records, and summarized: Pt was noted to have elevated A1c, and referred here.  Venous insuff and HTN were also addressed.        Assessment & Plan:  Insulin-requiring type 2 DM, with stage 3 CRI: uncontrolled.   Memory loss: I am open to the idea of a pump, but I hesitate for this reason.   HTN: is noted today.    Patient Instructions  Your blood pressure is high today.  Please see your primary care provider soon, to have it rechecked good diet and exercise significantly improve the control of your diabetes.  please let me know if you wish to be referred to a dietician.  high blood sugar is very risky to your health.  you should see an eye doctor and dentist every year.  It is very important to get all recommended vaccinations.  Controlling your blood pressure and cholesterol drastically reduces the damage diabetes does to your body.  Those who smoke should quit.  Please discuss these with your doctor.  For now, please change the insulin to the numbers listed below (however, please reduce the [place] first, then 2 days later, make the increase in [place]). Please come back for a follow-up appointment in 6 weeks.  Please see a diabetes educator the same day, to consider a pump.

## 2020-07-07 ENCOUNTER — Telehealth: Payer: Self-pay | Admitting: *Deleted

## 2020-07-07 NOTE — Telephone Encounter (Signed)
Ok. Thank you.

## 2020-07-07 NOTE — Telephone Encounter (Signed)
Nicholas Rice, SW with [facility], called to say there would be a delay in [facility] till next week 2/2 to his location of [place]. Hubbard Hartshorn, BSN, RN-BC

## 2020-07-09 DIAGNOSIS — R531 Weakness: Secondary | ICD-10-CM | POA: Diagnosis not present

## 2020-07-10 ENCOUNTER — Encounter: Payer: Self-pay | Admitting: *Deleted

## 2020-07-10 NOTE — Telephone Encounter (Signed)
Received completed application and proof of income for [place].  Applications are in Dr. Sammie Bench 'team' box for signature.

## 2020-07-11 DIAGNOSIS — E1142 Type 2 diabetes mellitus with diabetic polyneuropathy: Secondary | ICD-10-CM | POA: Diagnosis not present

## 2020-07-11 DIAGNOSIS — E1151 Type 2 diabetes mellitus with diabetic peripheral angiopathy without gangrene: Secondary | ICD-10-CM | POA: Diagnosis not present

## 2020-07-11 DIAGNOSIS — R69 Illness, unspecified: Secondary | ICD-10-CM | POA: Diagnosis not present

## 2020-07-11 DIAGNOSIS — E785 Hyperlipidemia, unspecified: Secondary | ICD-10-CM | POA: Diagnosis not present

## 2020-07-11 DIAGNOSIS — G8929 Other chronic pain: Secondary | ICD-10-CM | POA: Diagnosis not present

## 2020-07-11 DIAGNOSIS — I110 Hypertensive heart disease with heart failure: Secondary | ICD-10-CM | POA: Diagnosis not present

## 2020-07-11 DIAGNOSIS — Z008 Encounter for other general examination: Secondary | ICD-10-CM | POA: Diagnosis not present

## 2020-07-11 DIAGNOSIS — I509 Heart failure, unspecified: Secondary | ICD-10-CM | POA: Diagnosis not present

## 2020-07-11 DIAGNOSIS — D649 Anemia, unspecified: Secondary | ICD-10-CM | POA: Diagnosis not present

## 2020-07-11 DIAGNOSIS — Z794 Long term (current) use of insulin: Secondary | ICD-10-CM | POA: Diagnosis not present

## 2020-07-12 DIAGNOSIS — R531 Weakness: Secondary | ICD-10-CM | POA: Diagnosis not present

## 2020-07-13 DIAGNOSIS — J188 Other pneumonia, unspecified organism: Secondary | ICD-10-CM | POA: Diagnosis not present

## 2020-07-14 ENCOUNTER — Ambulatory Visit (INDEPENDENT_AMBULATORY_CARE_PROVIDER_SITE_OTHER): Payer: Medicare HMO | Admitting: Dietician

## 2020-07-14 ENCOUNTER — Telehealth: Payer: Self-pay | Admitting: *Deleted

## 2020-07-14 ENCOUNTER — Encounter: Payer: Self-pay | Admitting: Diagnostic Neuroimaging

## 2020-07-14 ENCOUNTER — Encounter: Payer: Self-pay | Admitting: Dietician

## 2020-07-14 ENCOUNTER — Other Ambulatory Visit: Payer: Self-pay | Admitting: Student

## 2020-07-14 ENCOUNTER — Other Ambulatory Visit: Payer: Self-pay | Admitting: Dietician

## 2020-07-14 ENCOUNTER — Telehealth: Payer: Self-pay | Admitting: Diagnostic Neuroimaging

## 2020-07-14 ENCOUNTER — Ambulatory Visit (INDEPENDENT_AMBULATORY_CARE_PROVIDER_SITE_OTHER): Payer: Medicare HMO | Admitting: Internal Medicine

## 2020-07-14 ENCOUNTER — Ambulatory Visit: Payer: Medicare HMO | Admitting: Diagnostic Neuroimaging

## 2020-07-14 ENCOUNTER — Encounter: Payer: Self-pay | Admitting: Internal Medicine

## 2020-07-14 VITALS — BP 132/55 | HR 82 | Temp 98.2°F | Wt 259.9 lb

## 2020-07-14 VITALS — BP 127/58 | HR 88 | Ht 70.0 in | Wt 260.0 lb

## 2020-07-14 DIAGNOSIS — I10 Essential (primary) hypertension: Secondary | ICD-10-CM

## 2020-07-14 DIAGNOSIS — N1832 Chronic kidney disease, stage 3b: Secondary | ICD-10-CM

## 2020-07-14 DIAGNOSIS — E118 Type 2 diabetes mellitus with unspecified complications: Secondary | ICD-10-CM

## 2020-07-14 DIAGNOSIS — I5032 Chronic diastolic (congestive) heart failure: Secondary | ICD-10-CM

## 2020-07-14 DIAGNOSIS — Z713 Dietary counseling and surveillance: Secondary | ICD-10-CM

## 2020-07-14 DIAGNOSIS — J069 Acute upper respiratory infection, unspecified: Secondary | ICD-10-CM

## 2020-07-14 DIAGNOSIS — R413 Other amnesia: Secondary | ICD-10-CM | POA: Diagnosis not present

## 2020-07-14 DIAGNOSIS — E291 Testicular hypofunction: Secondary | ICD-10-CM

## 2020-07-14 DIAGNOSIS — Z23 Encounter for immunization: Secondary | ICD-10-CM | POA: Diagnosis not present

## 2020-07-14 DIAGNOSIS — G4733 Obstructive sleep apnea (adult) (pediatric): Secondary | ICD-10-CM | POA: Diagnosis not present

## 2020-07-14 DIAGNOSIS — K7460 Unspecified cirrhosis of liver: Secondary | ICD-10-CM | POA: Diagnosis not present

## 2020-07-14 MED ORDER — ACCU-CHEK GUIDE VI STRP: ORAL_STRIP | 11 refills | Status: DC

## 2020-07-14 MED ORDER — INSULIN PEN NEEDLE 32G X 4 MM MISC: 3 refills | Status: AC

## 2020-07-14 MED ORDER — LOSARTAN POTASSIUM 25 MG PO TABS: 25.0000 mg | ORAL_TABLET | Freq: Every day | ORAL | 2 refills | Status: AC

## 2020-07-14 MED ORDER — "INSULIN SYRINGE-NEEDLE U-100 31G X 15/64"" 1 ML MISC": 3 refills | Status: AC

## 2020-07-14 MED ORDER — METOPROLOL SUCCINATE ER 25 MG PO TB24: 12.5000 mg | ORAL_TABLET | Freq: Every day | ORAL | 2 refills | Status: AC

## 2020-07-14 MED ORDER — ACCU-CHEK SOFTCLIX LANCETS MISC: 7 refills | Status: DC

## 2020-07-14 NOTE — Telephone Encounter (Signed)
Patient was no show for new patient appointment today. 

## 2020-07-14 NOTE — Progress Notes (Signed)
    Medical Nutrition Therapy:  Appt start time: 5361 end time:  1115 Total time: 60 Visit # 3  Assessment:  Primary concerns today: taking care of his diabetes.  Nicholas Rice is here for follow up for carb counting, personal Continuous glucose monitor update and diabetes support. He is starting to read labels and is becoming more aware of portions sizes, is interested in an insulin pump,learning about his diabetes foot care,and how to best utilize his insurance coverage. He was abel to recall that his goal is 60 gram carb per meal.   ANTHROPOMETRICS: Estimated body mass index is 37.31 kg/m as calculated from the following:   Height as of an earlier encounter on 07/14/20: 5\' 10"  (1.778 m).   Weight as of an earlier encounter on 07/14/20: 260 lb (117.9 kg).  WEIGHT HISTORY:  Wt Readings from Last 5 Encounters:  07/14/20 260 lb (117.9 kg)  07/14/20 259 lb 14.4 oz (117.9 kg)  07/04/20 257 lb 4 oz (116.7 kg)  07/04/20 260 lb 1.6 oz (118 kg)  07/01/20 258 lb 11.2 oz (117.3 kg)   MEDICATIONS: 130 Tresiba once a day , 45 units of FIASP with meals three times a day BLOOD SUGAR:He shows me his handwritten log  that his fasting blood sugar are mostly in the 100s-. However,his meter download for past 6 days shows:  fastings 231/248/180/337/328/327/332 Later in the day: 136/124/187/206/301/119/172/436   DIETARY INTAKE: When asked about his is used what he leanred about carb counting, he replied that he Likes popcorn for snack and has recently changed to lower salt and fat brand. An that he realized he eats what they call "2 servings"  Bought herb seasoning to help decrease his salt intake.   Usual physical activity: mows lawn with ride mower, walks  Progress Towards Goal(s):  In progress.   Nutritional Diagnosis:  NB-1.1 Food and nutrition-related knowledge deficit As related to lack of sufficient prior knowledge of diabetes self management.  As evidenced by his report and eagerness to learn  today.    Intervention:  Nutrition education about meal planning for diabetes, insulin pumps, Continuous glucose monitoring and diabetes medicines. Action Goal: check blood sugar 4 times a day aim for 45-60 grams of carb per meal, take fiasp with meals  Outcome goal: improved knowledge of diabetes self management and blood sugars Coordination of care: consider GLP-1/SGLT2i to assist with weight and blood sugar control, heart failure   Teaching Method Utilized: Visual, Auditory,Hands on Handouts given during visit include: carb counting and meal planning guide Barriers to learning/adherence to lifestyle change: material resources Demonstrated degree of understanding via:  Teach Back   Monitoring/Evaluation:  Dietary intake, exercise, reader, and body weight in 3 week(s)  Debera Lat, RD 07/14/2020 5:20 PM. .

## 2020-07-14 NOTE — Progress Notes (Signed)
CC: Hypertension  HPI:  Nicholas Rice is a 64 y.o. male with a past medical history stated below and presents today for hypertension. Please see problem based assessment and plan for additional details.  Past Medical History:  Diagnosis Date  . Anemia   . Benign prostatic hyperplasia   . CAD in native artery   . Cancer ([place])    basal cell  . Depression   . Diabetic neuropathy ([place])   . Diastolic heart failure ([place])   . Edema   . Enlarged prostate   . GERD (gastroesophageal reflux disease)   . Gout   . Hyperlipidemia   . Hypertension   . Hypogonadism in male   . Muscle cramp   . Osteoarthritis   . Pancytopenia ([place]) 01/02/2015  . Peripheral neuropathy   . 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 Renal insufficiency   . Stage 3 chronic kidney disease ([place])   . Type 2 diabetes with complication [facility])     Current Outpatient Medications on File Prior to Visit  Medication Sig Dispense Refill  . albuterol (PROVENTIL) (2.5 MG/3ML) 0.083% nebulizer solution Take 3 mLs (2.5 mg total) by nebulization every 4 (four) hours as needed for wheezing or shortness of breath. 75 mL 2  . APPLE CIDER VINEGAR PO Take 1 tablet by mouth daily.     Marland Kitchen atorvastatin (LIPITOR) 40 MG tablet Take 40 mg by mouth daily.    Marland Kitchen b complex vitamins capsule Take 1 capsule by mouth daily.    . Blood Glucose Monitoring Suppl (ACCU-CHEK GUIDE) w/Device KIT     . colchicine 0.6 MG tablet Take 1 tablet (0.6 mg total) by mouth 2 (two) times daily. 60 tablet 5  . ferrous sulfate 325 (65 FE) MG EC tablet Take 325 mg by mouth. One a day.    . furosemide (LASIX) 40 MG tablet Take 40 mg by mouth in the morning, at noon, and at bedtime.     . gabapentin (NEURONTIN) 600 MG tablet Take 600 mg by mouth at bedtime.     . Insulin Aspart, w/Niacinamide, (FIASP) 100 UNIT/ML SOLN Inject 60 Units into the skin 3 (three) times daily before meals. 6 mL 0  . insulin degludec (TRESIBA FLEXTOUCH) 200 UNIT/ML FlexTouch  Pen Inject 130 Units into the skin daily. 12 mL 0  . magnesium oxide (MAG-OX) 400 MG tablet Take 400 mg by mouth daily.     . meloxicam (MOBIC) 15 MG tablet Take 15 mg by mouth daily.     . Misc Natural Products (JOINT HEALTH PO) Take 1 tablet by mouth daily.     . pantoprazole (PROTONIX) 40 MG tablet Take 40 mg by mouth daily.    . sertraline (ZOLOFT) 100 MG tablet Take 200 mg by mouth daily.     . tamsulosin (FLOMAX) 0.4 MG CAPS capsule Take 0.4 mg by mouth daily.    . traMADol (ULTRAM) 50 MG tablet Take 50 mg by mouth 4 (four) times daily as needed for moderate pain or severe pain.     . Vitamin D, Ergocalciferol, (DRISDOL) 1.25 MG (50000 UNIT) CAPS capsule Take 50,000 Units by mouth every 7 (seven) days.     No current facility-administered medications on file prior to visit.    Family History  Problem Relation Age of Onset  . Diabetes Mother   . Emphysema Mother   . Congestive Heart Failure Mother   . Diabetes Father   . Heart disease Father   .  Heart attack Father     Social History   Socioeconomic History  . Marital status: Single    Spouse name: Not on file  . Number of children: 2  . Years of education: Not on file  . Highest education level: 10th grade  Occupational History  . Not on file  Tobacco Use  . Smoking status: Former Research scientist (life sciences)  . Smokeless tobacco: Never Used  Substance and Sexual Activity  . Alcohol use: Never  . Drug use: Never  . Sexual activity: Not on file  Other Topics Concern  . Not on file  Social History Narrative   07/14/20 Lives alone   Caffeine- sodas 3 daily   Social Determinants of Health   Financial Resource Strain:   . Difficulty of Paying Living Expenses: Not on file  Food Insecurity:   . Worried About Charity fundraiser in the Last Year: Not on file  . Ran Out of Food in the Last Year: Not on file  Transportation Needs:   . Lack of Transportation (Medical): Not on file  . Lack of Transportation (Non-Medical): Not on file    Physical Activity:   . Days of Exercise per Week: Not on file  . Minutes of Exercise per Session: Not on file  Stress:   . Feeling of Stress : Not on file  Social Connections:   . Frequency of Communication with Friends and Family: Not on file  . Frequency of Social Gatherings with Friends and Family: Not on file  . Attends Religious Services: Not on file  . Active Member of Clubs or Organizations: Not on file  . Attends Archivist Meetings: Not on file  . Marital Status: Not on file  Intimate Partner Violence:   . Fear of Current or Ex-Partner: Not on file  . Emotionally Abused: Not on file  . Physically Abused: Not on file  . Sexually Abused: Not on file    Review of Systems: ROS negative except for what is noted on the assessment and plan.  Vitals:   07/14/20 0854 07/14/20 0923  BP: (!) 152/59 (!) 132/55  Pulse: 90 82  Temp: 98.2 F (36.8 C)   TempSrc: Oral   SpO2: 97%   Weight: 259 lb 14.4 oz (117.9 kg)      Physical Exam: Physical Exam Constitutional:      Appearance: Normal appearance.  HENT:     Head: Normocephalic and atraumatic.  Eyes:     Extraocular Movements: Extraocular movements intact.  Cardiovascular:     Rate and Rhythm: Normal rate.     Pulses: Normal pulses.     Heart sounds: Normal heart sounds.  Pulmonary:     Effort: Pulmonary effort is normal.     Breath sounds: Normal breath sounds.  Abdominal:     General: Bowel sounds are normal.     Palpations: Abdomen is soft.     Tenderness: There is no abdominal tenderness.  Musculoskeletal:        General: Normal range of motion.     Cervical back: Normal range of motion.     Comments: Trace to 1+ edema in the lower extremities bilaterally.  Skin:    General: Skin is warm and dry.  Neurological:     Mental Status: He is alert and oriented to person, place, and time. Mental status is at baseline.  Psychiatric:        Mood and Affect: Mood normal.      Assessment & Plan:  See Encounters Tab for problem based charting.  Patient discussed with Dr. Lars Mage, D.O. [facility], PGY-2 Pager: [phone], Phone: [phone] Date 07/15/2020 Time 6:21 AM

## 2020-07-14 NOTE — Telephone Encounter (Signed)
Patient request prescriptions for diabetes supplies

## 2020-07-14 NOTE — Telephone Encounter (Signed)
[employer] medicare order sent to [facility]. They will obtain the auth and reach out to the patient to schedule.  °

## 2020-07-14 NOTE — Patient Instructions (Addendum)
Thank you, Mr.Zaccheus Dastrup for allowing [place] to provide your care today. Today we discussed blood pressure, diabetes, cirrhosis, sleep apnea.    I have ordered the following labs for you:   Lab Orders     BMP8+Anion Gap   Please get tested for COVID (I gave you a hand out with testing locations)  I will call if any are abnormal. All of your labs can be accessed through "My Chart".  Referrals and tests ordered today:    Referral Orders     Ambulatory referral to Gastroenterology     Ambulatory referral to Sleep Studies   I have ordered the following medication/changed the following medications:   Medications Discontinued During This Encounter  Medication Reason   amLODipine (NORVASC) 5 MG tablet Change in therapy   metoprolol succinate (TOPROL XL) 25 MG 24 hr tablet      Meds ordered this encounter  Medications   losartan (COZAAR) 25 MG tablet    Sig: Take 1 tablet (25 mg total) by mouth daily.    Dispense:  30 tablet    Refill:  2   metoprolol succinate (TOPROL XL) 25 MG 24 hr tablet    Sig: Take 0.5 tablets (12.5 mg total) by mouth daily.    Dispense:  15 tablet    Refill:  2     Follow up: 2 weeks    Remember:   Should you have any questions or concerns please call the internal medicine clinic at [phone].     Marianna Payment, D.O. [place]

## 2020-07-14 NOTE — Patient Instructions (Addendum)
Hi Nicholas Rice,   I am sorry we did not get to cover much about diabetes meal planning today! I included some light reading about  Carb counting  below. :)  It sounds like you are doing a good job cutting back on salt and salty foods.   I'd like to follow up in about 3 weeks. If you get your Continuous glucose monitor- please bring it with you.   Happy to answer any questions.  Butch Penny [phone]   Carbohydrate Counting for Diabetes Mellitus, Adult  Carbohydrate counting is a method of keeping track of how many carbohydrates you eat. Eating carbohydrates naturally increases the amount of sugar (glucose) in the blood. Counting how many carbohydrates you eat helps keep your blood glucose within normal limits, which helps you manage your diabetes (diabetes mellitus). It is important to know how many carbohydrates you can safely have in each meal. This is different for every person. A diet and nutrition specialist (registered dietitian) can help you make a meal plan and calculate how many carbohydrates you should have at each meal and snack. Carbohydrates are found in the following foods:  Grains, such as breads and cereals.  Dried beans and soy products.  Starchy vegetables, such as potatoes, peas, and corn.  Fruit and fruit juices.  Milk and yogurt.  Sweets and snack foods, such as cake, cookies, candy, chips, and soft drinks. How do I count carbohydrates? There are two ways to count carbohydrates in food. You can use either of the methods or a combination of both.  Reading "Nutrition Facts" on packaged food The "Nutrition Facts" list is included on the labels of almost all packaged foods and beverages in the [place] It includes:  The serving size.  Information about nutrients in each serving, including the grams (g) of carbohydrate per serving. To use the "Nutrition Facts":  Decide how many servings you will have.  Multiply the number of servings by the number of carbohydrates  per serving.  The resulting number is the total amount of carbohydrates that you will be having.  Example of carbohydrate counting Sample meal  3 oz (85 g) chicken breast.  6 oz (170 g) brown rice.  4 oz (113 g) corn.  8 oz (240 mL) milk.  8 oz (170 g) strawberries with sugar-free whipped topping. Carbohydrate calculation 1. Identify the foods that contain carbohydrates: ? Rice. ? Corn. ? Milk. ? Strawberries. 2. Calculate how many servings you have of each food: ? 2 servings rice. ? 1 serving corn. ? 1 serving milk. ? 1 serving strawberries. 3. Multiply each number of servings by 15 g: ? 2 servings rice x 15 g = 30 g. ? 1 serving corn x 15 g = 15 g. ? 1 serving milk x 15 g = 15 g. ? 1 serving strawberries x 15 g = 15 g. 4. Add together all of the amounts to find the total grams of carbohydrates eaten: ? 30 g + 15 g + 15 g + 15 g = 75 g of carbohydrates total. Summary  Carbohydrate counting is a method of keeping track of how many carbohydrates you eat.  Eating carbohydrates naturally increases the amount of sugar (glucose) in the blood.  Counting how many carbohydrates you eat helps keep your blood glucose within normal limits, which helps you manage your diabetes.Marland Kitchen

## 2020-07-14 NOTE — Patient Instructions (Signed)
MILD MEMORY LOSS (possible mild cognitive impairment) - check MRI brain  - safety / supervision issues reviewed - daily physical activity / exercise (at least 15-30 minutes) - eat more plants / vegetables - increase social activities, brain stimulation, games, puzzles, hobbies, crafts, arts, music - aim for at least 7-8 hours sleep per night (or more) - avoid smoking and alcohol - caution with driving and finances

## 2020-07-14 NOTE — Progress Notes (Signed)
[facility]  PATIENT: Nicholas Rice DOB: 01/30/1956  REFERRING CLINICIAN: Cher Nakai, MD HISTORY FROM: patient  REASON FOR VISIT: new consult    HISTORICAL  CHIEF COMPLAINT:  Chief Complaint  Patient presents with  . Memory Loss    rm 7 New Pt MMSE 25    HISTORY OF PRESENT ILLNESS:   64 year old male here for evaluation of short-term memory loss.  History of hypertension, diabetes, depression, obesity, fatty liver disease.  Symptoms have been noticed by patient slightly.  His son also has been concerned.  Symptoms have been going on about 1 to 2 years.  Symptoms are gradual onset progressive.  No major changes in ADLs.  Patient is retired.  He lives alone.  He takes care of all of his activities himself.  Sometimes he has trouble focusing and paying attention.  Sometimes he has some word finding difficulties.    REVIEW OF SYSTEMS: Full 14 system review of systems performed and negative with exception of: As per HPI.  ALLERGIES: Allergies  Allergen Reactions  . Loratadine Rash    HOME MEDICATIONS: Outpatient Medications Prior to Visit  Medication Sig Dispense Refill  . ACCU-CHEK GUIDE test strip     . Accu-Chek Softclix Lancets lancets     . albuterol (PROVENTIL) (2.5 MG/3ML) 0.083% nebulizer solution Take 3 mLs (2.5 mg total) by nebulization every 4 (four) hours as needed for wheezing or shortness of breath. 75 mL 2  . APPLE CIDER VINEGAR PO Take 1 tablet by mouth daily.     Marland Kitchen atorvastatin (LIPITOR) 40 MG tablet Take 40 mg by mouth daily.    Marland Kitchen b complex vitamins capsule Take 1 capsule by mouth daily.    . Blood Glucose Monitoring Suppl (ACCU-CHEK GUIDE) w/Device KIT     . colchicine 0.6 MG tablet Take 1 tablet (0.6 mg total) by mouth 2 (two) times daily. 60 tablet 5  . ferrous sulfate 325 (65 FE) MG EC tablet Take 325 mg by mouth. One a day.    . furosemide (LASIX) 40 MG tablet Take 40 mg by mouth in the morning, at noon, and at bedtime.     .  gabapentin (NEURONTIN) 600 MG tablet Take 600 mg by mouth at bedtime.     . Insulin Aspart, w/Niacinamide, (FIASP) 100 UNIT/ML SOLN Inject 60 Units into the skin 3 (three) times daily before meals. 6 mL 0  . insulin degludec (TRESIBA FLEXTOUCH) 200 UNIT/ML FlexTouch Pen Inject 130 Units into the skin daily. 12 mL 0  . losartan (COZAAR) 25 MG tablet Take 1 tablet (25 mg total) by mouth daily. 30 tablet 2  . magnesium oxide (MAG-OX) 400 MG tablet Take 400 mg by mouth daily.     . meloxicam (MOBIC) 15 MG tablet Take 15 mg by mouth daily.     . metoprolol succinate (TOPROL XL) 25 MG 24 hr tablet Take 0.5 tablets (12.5 mg total) by mouth daily. 15 tablet 2  . Misc Natural Products (JOINT HEALTH PO) Take 1 tablet by mouth daily.     . pantoprazole (PROTONIX) 40 MG tablet Take 40 mg by mouth daily.    . sertraline (ZOLOFT) 100 MG tablet Take 200 mg by mouth daily.     . tamsulosin (FLOMAX) 0.4 MG CAPS capsule Take 0.4 mg by mouth daily.    . traMADol (ULTRAM) 50 MG tablet Take 50 mg by mouth 4 (four) times daily as needed for moderate pain or severe pain.     Marland Kitchen  Vitamin D, Ergocalciferol, (DRISDOL) 1.25 MG (50000 UNIT) CAPS capsule Take 50,000 Units by mouth every 7 (seven) days.     No facility-administered medications prior to visit.    PAST MEDICAL HISTORY: Past Medical History:  Diagnosis Date  . Anemia   . Benign prostatic hyperplasia   . CAD in native artery   . Cancer ([place])    basal cell  . Depression   . Diabetic neuropathy ([place])   . Diastolic heart failure ([place])   . Edema   . Enlarged prostate   . GERD (gastroesophageal reflux disease)   . Gout   . Hyperlipidemia   . Hypertension   . Hypogonadism in male   . Muscle cramp   . Osteoarthritis   . Pancytopenia ([place]) 01/02/2015  . Peripheral neuropathy   . 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 Renal insufficiency   . Stage 3 chronic kidney disease ([place])   . Type 2 diabetes with complication ([place])     PAST  SURGICAL HISTORY: Past Surgical History:  Procedure Laterality Date  . CATARACT EXTRACTION Bilateral   . CHOLECYSTECTOMY  1981  . [place]  . TONSILLECTOMY  1963    FAMILY HISTORY: Family History  Problem Relation Age of Onset  . Diabetes Mother   . Emphysema Mother   . Congestive Heart Failure Mother   . Diabetes Father   . Heart disease Father   . Heart attack Father     SOCIAL HISTORY: Social History   Socioeconomic History  . Marital status: Single    Spouse name: Not on file  . Number of children: 2  . Years of education: Not on file  . Highest education level: 10th grade  Occupational History  . Not on file  Tobacco Use  . Smoking status: Former Research scientist (life sciences)  . Smokeless tobacco: Never Used  Substance and Sexual Activity  . Alcohol use: Never  . Drug use: Never  . Sexual activity: Not on file  Other Topics Concern  . Not on file  Social History Narrative   07/14/20 Lives alone   Caffeine- sodas 3 daily   Social Determinants of Health   Financial Resource Strain:   . Difficulty of Paying Living Expenses: Not on file  Food Insecurity:   . Worried About Charity fundraiser in the Last Year: Not on file  . Ran Out of Food in the Last Year: Not on file  Transportation Needs:   . Lack of Transportation (Medical): Not on file  . Lack of Transportation (Non-Medical): Not on file  Physical Activity:   . Days of Exercise per Week: Not on file  . Minutes of Exercise per Session: Not on file  Stress:   . Feeling of Stress : Not on file  Social Connections:   . Frequency of Communication with Friends and Family: Not on file  . Frequency of Social Gatherings with Friends and Family: Not on file  . Attends Religious Services: Not on file  . Active Member of Clubs or Organizations: Not on file  . Attends Archivist Meetings: Not on file  . Marital Status: Not on file  Intimate Partner Violence:   . Fear of Current or Ex-Partner: Not on  file  . Emotionally Abused: Not on file  . Physically Abused: Not on file  . Sexually Abused: Not on file     PHYSICAL EXAM  GENERAL EXAM/CONSTITUTIONAL: Vitals:  Vitals:   07/14/20 1143  BP: (!) 127/58  Pulse: 88  Weight: 260 lb (117.9 kg)  Height: _0  (1.778 m)     Body mass index is 37.31 kg/m. Wt Readings from Last 3 Encounters:  07/14/20 260 lb (117.9 kg)  07/14/20 259 lb 14.4 oz (117.9 kg)  07/04/20 257 lb 4 oz (116.7 kg)     Patient is in no distress; well developed, nourished and groomed; neck is supple  CARDIOVASCULAR:  Examination of carotid arteries is normal; no carotid bruits  Regular rate and rhythm, no murmurs  Examination of peripheral vascular system by observation and palpation is normal  EYES:  Ophthalmoscopic exam of optic discs and posterior segments is normal; no papilledema or hemorrhages  No exam data present  MUSCULOSKELETAL:  Gait, strength, tone, movements noted in Neurologic exam below  NEUROLOGIC: MENTAL STATUS:  MMSE - [place] Exam 07/14/2020  Orientation to time 5  Orientation to Place 5  Registration 3  Attention/ Calculation 3  Recall 1  Language- name 2 objects 2  Language- repeat 0  Language- follow 3 step command 3  Language- read & follow direction 1  Write a sentence 1  Copy design 1  Total score 25    awake, alert, oriented to person, place and time  recent and remote memory intact  normal attention and concentration  language fluent, comprehension intact, naming intact  fund of knowledge appropriate  CRANIAL NERVE:   2nd - no papilledema on fundoscopic exam  2nd, 3rd, 4th, 6th - pupils equal and reactive to light, visual fields full to confrontation, extraocular muscles intact, no nystagmus  5th - facial sensation symmetric  7th - facial strength symmetric  8th - hearing intact  9th - palate elevates symmetrically, uvula midline  11th - shoulder shrug symmetric  12th -  tongue protrusion midline  MOTOR:   normal bulk and tone, full strength in the BUE, BLE  SENSORY:   normal and symmetric to light touch, temperature, vibration; EXCEPT DECR IN FEET  COORDINATION:   finger-nose-finger, fine finger movements normal  REFLEXES:   deep tendon reflexes TRACE and symmetric  GAIT/STATION:   narrow based gait     DIAGNOSTIC DATA (LABS, IMAGING, TESTING) - I reviewed patient records, labs, notes, testing and imaging myself where available.  Lab Results  Component Value Date   WBC 9.4 06/18/2020   HGB 11.5 (L) 06/18/2020   HCT 34.3 (L) 06/18/2020   MCV 90 06/18/2020   PLT 227 06/18/2020      Component Value Date/Time   NA 134 (L) 07/01/2020 1322   NA 135 06/23/2020 1437   K 4.0 07/01/2020 1322   CL 101 07/01/2020 1322   CO2 21 (L) 07/01/2020 1322   GLUCOSE 269 (H) 07/01/2020 1322   BUN 23 07/01/2020 1322   BUN 56 (H) 06/23/2020 1437   CREATININE 1.34 (H) 07/01/2020 1322   CALCIUM 8.5 (L) 07/01/2020 1322   PROT 7.2 06/18/2020 1541   ALBUMIN 4.1 06/18/2020 1541   AST 43 (H) 06/18/2020 1541   ALT 47 (H) 06/18/2020 1541   ALKPHOS 184 (H) 06/18/2020 1541   BILITOT 0.4 06/18/2020 1541   GFRNONAA 55 (L) 07/01/2020 1322   GFRAA 51 (L) 06/23/2020 1437   Lab Results  Component Value Date   CHOL 111 04/26/2020   TRIG 345 (A) 04/26/2020   Lab Results  Component Value Date   HGBA1C 8.8 (A) 07/04/2020   Lab Results  Component Value Date   VITAMINB12 287 06/01/2020  No results found for: TSH     ASSESSMENT AND PLAN  64 y.o. year old male here with mild short-term memory loss, MMSE 25/30, no major changes in ADLs.   Dx:  1. Memory loss     PLAN:  MILD MEMORY LOSS (possible mild cognitive impairment) - check MRI brain  - safety / supervision issues reviewed - daily physical activity / exercise (at least 15-30 minutes) - eat more plants / vegetables - increase social activities, brain stimulation, games, puzzles,  hobbies, crafts, arts, music - aim for at least 7-8 hours sleep per night (or more) - avoid smoking and alcohol - caution with driving and finances  Orders Placed This Encounter  Procedures  . MR BRAIN WO CONTRAST   Return for pending if symptoms worsen or fail to improve, return to PCP.    Penni Bombard, MD 67/73/7366, 81:59 AM Certified in Neurology, Neurophysiology and Neuroimaging  [facility] [address] [place], [place] [address] [phone]

## 2020-07-15 ENCOUNTER — Encounter: Payer: Self-pay | Admitting: Internal Medicine

## 2020-07-15 DIAGNOSIS — J069 Acute upper respiratory infection, unspecified: Secondary | ICD-10-CM

## 2020-07-15 DIAGNOSIS — G4733 Obstructive sleep apnea (adult) (pediatric): Secondary | ICD-10-CM | POA: Insufficient documentation

## 2020-07-15 HISTORY — DX: Obstructive sleep apnea (adult) (pediatric): G47.33

## 2020-07-15 HISTORY — DX: Acute upper respiratory infection, unspecified: J06.9

## 2020-07-15 LAB — BMP8+ANION GAP
Anion Gap: 17 mmol/L (ref 10.0–18.0)
BUN/Creatinine Ratio: 24 (ref 10–24)
BUN: 38 mg/dL — ABNORMAL HIGH (ref 8–27)
CO2: 19 mmol/L — ABNORMAL LOW (ref 20–29)
Calcium: 8.8 mg/dL (ref 8.6–10.2)
Chloride: 99 mmol/L (ref 96–106)
Creatinine, Ser: 1.57 mg/dL — ABNORMAL HIGH (ref 0.76–1.27)
GFR calc Af Amer: 53 mL/min/{1.73_m2} — ABNORMAL LOW (ref >59)
GFR calc non Af Amer: 46 mL/min/{1.73_m2} — ABNORMAL LOW (ref >59)
Glucose: 376 mg/dL — ABNORMAL HIGH (ref 65–99)
Potassium: 4.6 mmol/L (ref 3.5–5.2)
Sodium: 135 mmol/L (ref 134–144)

## 2020-07-15 NOTE — Assessment & Plan Note (Signed)
Patient has poorly controlled diabetes and has been on Tresiba 130 units daily and FIASP 60 units 3 times daily.  Patient is seeing an endocrinologist who is helping to manage his diabetic medications.  He will likely need further assistance with GLP-1 agonist or SGLT2 inhibitor in the future.  Patient has expressed desire to lose weight but is not currently exercising.  He states that he will get an eye exam this year at [place].  Foot exam performed today.  Plan: 1.  We gave him samples of his insulin today.  He should continue his insulin regimen until changes are made by his endocrinologist.

## 2020-07-15 NOTE — Assessment & Plan Note (Signed)
Patient's weight today was 257 pounds and is tolerating furosemide 40 mg 3 times daily.

## 2020-07-15 NOTE — Assessment & Plan Note (Signed)
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  Patient was diagnosis most recent admission on imaging.  I refer him to gastroenterologist today for upper endoscopy to screen for esophageal varices.  Patient does not have any stigmata of cirrhosis on exam.  Plan: 1.  Refer to gastroenterology for EGD.

## 2020-07-15 NOTE — Assessment & Plan Note (Addendum)
Patient presents today for further evaluation and management of hypertension.  Patient has been on amlodipine 5 mg and metoprolol 25 mg with a blood pressure of 132/55.  Patient does state that he is not sure if he is taking metoprolol 25 mg.  Regardless I will change his blood pressure medication today to better manage his comorbidities.  I will start him on losartan 25 mg due to his past history of microalbuminuria and history of heart failure.  Similarly, patient has a history of CAD would benefit from remaining on metoprolol and therefore, I will decrease his dose to 12.5 mg today. I specifically counseled this patient regarding the changes to his medications.  Plan: 1.  Start losartan 25 mg daily 2.  Continue metoprolol 12.5 mg daily 3.  2-week follow-up to test kidney function electrolytes after starting losartan.

## 2020-07-15 NOTE — Assessment & Plan Note (Signed)
Patient presents with a 1 week history of productive cough he denies any fevers, chills, myalgias sick contacts, shortness of breath.  Patient is not having any changes in taste or smell.  I counseled him regarding conservative managements with nasal steroid, antihistamine.  Informed the patient that if symptoms continue to worsen that he will need to get tested for Covid.  I gave him a pamphlet for how he can get tested.  Patient misunderstanding

## 2020-07-15 NOTE — Assessment & Plan Note (Signed)
Patient had positive stop bang screening for obstructive sleep apnea.  I will put in a sleep study to get this patient further evaluated.  He would likely benefit from a CPAP machine.

## 2020-07-15 NOTE — Telephone Encounter (Signed)
[employer] medicare Josem Kaufmann: [id number] (exp. 07/15/20 to 01/11/21)

## 2020-07-16 ENCOUNTER — Telehealth: Payer: Self-pay | Admitting: *Deleted

## 2020-07-16 NOTE — Telephone Encounter (Signed)
Alois Cliche, SW with [facility] called to say she has not been able to reach patient to schedule visit. States HH PT has also been unsuccessful in reaching patient. HH RN was able to see patient last week. One of her colleagues will do a drive by today or tomorrow. Informed Catalina Lunger that patient was in office 2 days ago. Confirmed she had the correct phone number for patient. They will continue reaching out to patient. Hubbard Hartshorn, BSN, RN-BC

## 2020-07-16 NOTE — Telephone Encounter (Signed)
Thank you for letting me know

## 2020-07-17 ENCOUNTER — Telehealth: Payer: Self-pay | Admitting: Dietician

## 2020-07-17 NOTE — Progress Notes (Signed)
Internal Medicine Clinic Attending  Case discussed with Dr. Coe  At the time of the visit.  We reviewed the resident's history and exam and pertinent patient test results.  I agree with the assessment, diagnosis, and plan of care documented in the resident's note.  

## 2020-07-17 NOTE — Telephone Encounter (Signed)
He got a call from [place] and he has been approved for CGM sensors. They will cost him 35$/month. He is also trying to speak with Claiborne Billings. He will try to call her next Thursday.

## 2020-07-18 ENCOUNTER — Other Ambulatory Visit: Payer: Self-pay

## 2020-07-18 ENCOUNTER — Ambulatory Visit
Admission: RE | Admit: 2020-07-18 | Discharge: 2020-07-18 | Disposition: A | Discharge status: Home / Self Care | Payer: Medicare HMO | Source: Ambulatory Visit | Attending: Diagnostic Neuroimaging | Admitting: Diagnostic Neuroimaging

## 2020-07-18 DIAGNOSIS — R413 Other amnesia: Secondary | ICD-10-CM | POA: Diagnosis not present

## 2020-07-21 ENCOUNTER — Telehealth: Payer: Self-pay

## 2020-07-21 ENCOUNTER — Other Ambulatory Visit: Payer: Self-pay | Admitting: Internal Medicine

## 2020-07-21 DIAGNOSIS — E118 Type 2 diabetes mellitus with unspecified complications: Secondary | ICD-10-CM

## 2020-07-21 MED ORDER — ONETOUCH VERIO VI STRP: ORAL_STRIP | 3 refills | Status: DC

## 2020-07-21 MED ORDER — ONETOUCH DELICA PLUS LANCET33G MISC: 3 refills | Status: DC

## 2020-07-21 MED ORDER — ONETOUCH VERIO W/DEVICE KIT: PACK | 0 refills | Status: AC

## 2020-07-21 NOTE — Telephone Encounter (Signed)
Received following fax from [place]:  "His insurance prefers One Touch supplies.  Can you send in RX's for One Touch Meter and supplies"?  Forwarding to PCP/red team. Laurence Compton, RN,BSN

## 2020-07-21 NOTE — Telephone Encounter (Signed)
Perfect! I really appreciate it!

## 2020-07-21 NOTE — Telephone Encounter (Signed)
Does he have a one touch meter?

## 2020-07-21 NOTE — Telephone Encounter (Signed)
Looks like he has the [employer]. So he will need a one touch meter, lancets, and strips.  I will copy Butch Penny on this message as well, as she is familiar with which pharmacies like which specific supplies. Thank you! Zamyra Allensworth

## 2020-07-21 NOTE — Telephone Encounter (Signed)
Done

## 2020-07-24 ENCOUNTER — Telehealth: Payer: Self-pay | Admitting: Diagnostic Neuroimaging

## 2020-07-24 NOTE — Telephone Encounter (Signed)
LVM informing patient his MRI brain results are unremarkable. Reviewed Dr Gladstone Lighter plan per office note and left # for questions.

## 2020-07-24 NOTE — Telephone Encounter (Signed)
Spoke to patient on the phone this morning and advised that I will call him back Monday, 07-28-20, with an update.  Patient assistance application was faxed to [employer] today for processing.  All paperwork and proof of income was received from patient.

## 2020-07-24 NOTE — Telephone Encounter (Signed)
Pt is asking to be called once the MRI results are available

## 2020-07-28 ENCOUNTER — Other Ambulatory Visit: Payer: Self-pay | Admitting: Internal Medicine

## 2020-07-28 ENCOUNTER — Telehealth: Payer: Self-pay | Admitting: Dietician

## 2020-07-28 DIAGNOSIS — I10 Essential (primary) hypertension: Secondary | ICD-10-CM

## 2020-07-28 DIAGNOSIS — E118 Type 2 diabetes mellitus with unspecified complications: Secondary | ICD-10-CM

## 2020-07-28 MED ORDER — ONETOUCH DELICA PLUS LANCET33G MISC: 3 refills | Status: AC

## 2020-07-28 MED ORDER — ONETOUCH VERIO VI STRP: ORAL_STRIP | 3 refills | Status: AC

## 2020-07-28 NOTE — Progress Notes (Signed)
Lab orders placed for 11/5 lab visit only.   -f/u bmp

## 2020-07-28 NOTE — Telephone Encounter (Signed)
Requests new prescription for testing supplies

## 2020-07-30 NOTE — Telephone Encounter (Signed)
Also, patient was notified, I spoke to him over the phone this morning.

## 2020-07-30 NOTE — Telephone Encounter (Signed)
Spoke with patient about his insulin costs and suggested he schedule with a doctor to discuss this. He has a lab appointment tomorrow and agreed to try to get an appointment with a doctor tomorrow as well.

## 2020-07-30 NOTE — Telephone Encounter (Signed)
I finally got through to Nicholas Rice today and Nicholas Rice application for Nicholas Rice and Nicholas Rice was denied based on the income that was submitted with application.  Patient's income exceeds program guidelines.

## 2020-08-01 ENCOUNTER — Ambulatory Visit (INDEPENDENT_AMBULATORY_CARE_PROVIDER_SITE_OTHER): Payer: Medicare HMO | Admitting: Gastroenterology

## 2020-08-01 ENCOUNTER — Encounter: Payer: Self-pay | Admitting: Internal Medicine

## 2020-08-01 ENCOUNTER — Other Ambulatory Visit (INDEPENDENT_AMBULATORY_CARE_PROVIDER_SITE_OTHER): Payer: Medicare HMO

## 2020-08-01 ENCOUNTER — Encounter: Payer: Self-pay | Admitting: Gastroenterology

## 2020-08-01 ENCOUNTER — Ambulatory Visit (INDEPENDENT_AMBULATORY_CARE_PROVIDER_SITE_OTHER): Payer: Medicare HMO | Admitting: Internal Medicine

## 2020-08-01 ENCOUNTER — Other Ambulatory Visit: Payer: Medicare HMO

## 2020-08-01 VITALS — BP 125/64 | HR 77 | Temp 98.5°F | Ht 70.0 in | Wt 265.7 lb

## 2020-08-01 VITALS — BP 118/60 | HR 78 | Ht 70.0 in | Wt 268.0 lb

## 2020-08-01 DIAGNOSIS — K7460 Unspecified cirrhosis of liver: Secondary | ICD-10-CM

## 2020-08-01 DIAGNOSIS — I10 Essential (primary) hypertension: Secondary | ICD-10-CM | POA: Diagnosis not present

## 2020-08-01 DIAGNOSIS — Z86010 Personal history of colonic polyps: Secondary | ICD-10-CM

## 2020-08-01 DIAGNOSIS — E118 Type 2 diabetes mellitus with unspecified complications: Secondary | ICD-10-CM | POA: Diagnosis not present

## 2020-08-01 DIAGNOSIS — M159 Polyosteoarthritis, unspecified: Secondary | ICD-10-CM | POA: Diagnosis not present

## 2020-08-01 DIAGNOSIS — R152 Fecal urgency: Secondary | ICD-10-CM | POA: Diagnosis not present

## 2020-08-01 DIAGNOSIS — D509 Iron deficiency anemia, unspecified: Secondary | ICD-10-CM

## 2020-08-01 DIAGNOSIS — K769 Liver disease, unspecified: Secondary | ICD-10-CM | POA: Diagnosis not present

## 2020-08-01 DIAGNOSIS — K625 Hemorrhage of anus and rectum: Secondary | ICD-10-CM

## 2020-08-01 DIAGNOSIS — M2559 Pain in other specified joint: Secondary | ICD-10-CM

## 2020-08-01 LAB — IBC + FERRITIN
Ferritin: 36.3 ng/mL (ref 22.0–322.0)
Iron: 64 ug/dL (ref 42–165)
Saturation Ratios: 17.9 % — ABNORMAL LOW (ref 20.0–50.0)
Transferrin: 256 mg/dL (ref 212.0–360.0)

## 2020-08-01 LAB — PROTIME-INR
INR: 1.1 ratio — ABNORMAL HIGH (ref 0.8–1.0)
Prothrombin Time: 12.7 s (ref 9.6–13.1)

## 2020-08-01 LAB — GAMMA GT: GGT: 123 U/L — ABNORMAL HIGH (ref 7–51)

## 2020-08-01 MED ORDER — CHOLESTYRAMINE 4 G PO PACK: 4.0000 g | PACK | Freq: Three times a day (TID) | ORAL | 12 refills | Status: AC

## 2020-08-01 MED ORDER — DICLOFENAC SODIUM 1 % EX GEL: 2.0000 g | Freq: Four times a day (QID) | CUTANEOUS | 2 refills | Status: AC

## 2020-08-01 MED ORDER — GABAPENTIN 600 MG PO TABS: 600.0000 mg | ORAL_TABLET | Freq: Every day | ORAL | 5 refills | Status: AC

## 2020-08-01 MED ORDER — SUPREP BOWEL PREP KIT 17.5-3.13-1.6 GM/177ML PO SOLN: 1.0000 | ORAL | 0 refills | Status: AC

## 2020-08-01 NOTE — Patient Instructions (Addendum)
[name],  It was a pleasure to see you today. Thank you for coming in.   Today we discussed your joint pains. In regards to this please stop taking the meloxicam, we can do short prescriptions for when you have flares of your pain. You can start using voltaren gel on your joints. I have referred you to physical therapy. You can use tylenol as needed for pain.   We also discussed your blood pressure. This was a little high today, this could be from the meloxicam use. We will repeat your blood pressure on your next visit.  In regards to your diabetes, please increase your [place] Aas to 150 unit daily. Continue the [place] as prescribed. Please make an appointment to see Butch Penny. Continue following with endocrinology.    Please return to clinic in 1 month or sooner if needed.   Thank you again for coming in.   Asencion Noble.D.

## 2020-08-01 NOTE — Progress Notes (Signed)
   CC: HTN, joint pains, and diabetes  HPI:  [name] is a 64 y.o. with history listed below presenting for hypertension, generalized joint pains, and diabetes.  Past Medical History:  Diagnosis Date  . Anemia   . Benign prostatic hyperplasia   . CAD in native artery   . Cancer ([place])    basal cell  . Depression   . Diabetic neuropathy ([place])   . Diastolic heart failure ([place])   . Edema   . Enlarged prostate   . GERD (gastroesophageal reflux disease)   . Gout   . Hyperlipidemia   . Hypertension   . Hypogonadism in male   . Muscle cramp   . Osteoarthritis   . Pancytopenia ([place]) 01/02/2015  . Peripheral neuropathy   . 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 Renal insufficiency   . Stage 3 chronic kidney disease ([place])   . Type 2 diabetes with complication ([facility])    Review of Systems:   Constitutional: Negative for chills and fever.  Respiratory: Negative for shortness of breath.   Cardiovascular: Negative for chest pain and leg swelling.  Gastrointestinal: Negative for abdominal pain, nausea and vomiting.  Musculoskeletal: Positive for pain in the shoulders, elbows, wrists, hands, hips, and knees. Neurological: Negative for dizziness and headaches.   Physical Exam:  Vitals:   08/01/20 1313  BP: (!) 147/64  Pulse: 82  Temp: 98.5 F (36.9 C)  TempSrc: Oral  SpO2: 97%  Weight: 265 lb 11.2 oz (120.5 kg)  Height: 5\' 10"  (1.778 m)   Physical Exam Constitutional:      Appearance: Normal appearance.  HENT:     Mouth/Throat:     Mouth: Mucous membranes are moist.     Pharynx: Oropharynx is clear.  Eyes:     Extraocular Movements: Extraocular movements intact.     Pupils: Pupils are equal, round, and reactive to light.  Cardiovascular:     Rate and Rhythm: Normal rate and regular rhythm.  Pulmonary:     Effort: Pulmonary effort is normal.     Breath sounds: Normal breath sounds.  Abdominal:     General: Abdomen is flat. Bowel sounds are normal.       Palpations: Abdomen is soft.  Musculoskeletal:        General: Tenderness present. No swelling or deformity. Normal range of motion.     Cervical back: Normal range of motion and neck supple.     Right lower leg: No edema.     Left lower leg: No edema.  Skin:    General: Skin is warm and dry.     Capillary Refill: Capillary refill takes less than 2 seconds.  Neurological:     General: No focal deficit present.     Mental Status: He is alert and oriented to person, place, and time.  Psychiatric:        Mood and Affect: Mood normal.        Behavior: Behavior normal.     Assessment & Plan:   See Encounters Tab for problem based charting.  Patient discussed with Dr. [place]

## 2020-08-01 NOTE — Progress Notes (Signed)
[place] VISIT   Primary Care Provider Marianna Payment, MD [address] [place] [address] [phone]  Referring Provider Cher Nakai, MD [address] [place],  [place] [address] [phone]  Patient Profile: Nicholas Rice is a 64 y.o. male with a pmh significant for hypertension, hyperlipidemia, diabetes, CHF, CAD, gout, osteoarthritis, chronic renal insufficiency, BCC of the skin, MDD, status post cholecystectomy, GERD, recent clinical diagnosis of cirrhosis (manifested by cirrhotic appearing liver, as well as splenomegaly and thrombocytopenia and portal hypertension changes noted on imaging), liver lesions NOS, reported colon polyps.  The patient presents to the [facility] for an evaluation and management of problem(s) noted below:  Problem List 1. Cirrhosis of liver without ascites, unspecified hepatic cirrhosis type ([place])   2. Liver lesion   3. Rectal bleeding   4. Rectal urgency   5. Iron deficiency anemia, unspecified iron deficiency anemia type   6. History of colonic polyps     History of Present Illness This is the patient's first visit to the outpatient [place] clinic.  He states he has previously seen gastroenterology in [place] years ago.  He has a history of colon polyps per his report.  The patient was admitted in September to the hospital in the setting of atypical pneumonia, anasarca, gout flare, diabetes control, and findings of imaging concerning for underlying cirrhosis and a new liver lesion.  He had an elevated AFP in September.  He is followed up with his primary care provider and being monitored closely.  He presents by himself today.  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  He is feeling much better than his admission in September.  He does note that he is very sick overall but has plans to do better.  He had never  been told he had liver disease prior to his admission.  Patient states he is a social alcohol consumer if that.  He has never been a heavy drinker.  He has had longstanding issues of obesity and diabetes and hypertension however.  The patient denies any issues with jaundice, scleral icterus, pruritus, darkened/amber urine, clay-colored stools, hematemesis, coffee-ground emesis, abdominal distention, confusion.  The patient has had a previous history of an upper and lower endoscopy though it has been greater than 10 years per his report.  These were done on [place].  Patient has had some bright red blood per rectum when he wipes this is been ongoing for years and is thought to be hemorrhoidal per his report.  He has urgency to use the restroom and has had this for quite a while really since his cholecystectomy years ago.  GI Review of Systems Positive as above including pyrosis at times, infrequent pill dysphagia Negative for odynophagia, nausea, vomiting, pain, change in bowel habits, melena  Review of Systems General: Denies fevers/chills/weight loss unintentionally HEENT: Denies oral lesions Cardiovascular: Denies chest pain Pulmonary: Shortness of breath is improved dramatically since his hospitalization he is no longer on oxygen  Gastroenterological: See HPI Genitourinary: Denies darkened urine or hematuria Hematological: Positive for mildly easy bruising/bleeding Endocrine: Denies temperature intolerance Dermatological: Denies jaundice Psychological: Mood is stable   Medications Current Outpatient Medications  Medication Sig Dispense Refill  . albuterol (PROVENTIL) (2.5 MG/3ML) 0.083% nebulizer solution Take 3 mLs (2.5 mg total) by nebulization every 4 (four) hours as needed for wheezing or shortness of breath. 75 mL  2  . APPLE CIDER VINEGAR PO Take 1 tablet by mouth daily.     Marland Kitchen atorvastatin (LIPITOR) 40 MG tablet Take 40 mg by mouth daily.    Marland Kitchen b complex vitamins capsule Take 1  capsule by mouth daily.    . Blood Glucose Monitoring Suppl (ONETOUCH VERIO) w/Device KIT Check blood sugar 4 times daily 1 kit 0  . colchicine 0.6 MG tablet Take 1 tablet (0.6 mg total) by mouth 2 (two) times daily. 60 tablet 5  . diclofenac Sodium (VOLTAREN) 1 % GEL Apply 2 g topically 4 (four) times daily. 150 g 2  . ferrous sulfate 325 (65 FE) MG EC tablet Take 325 mg by mouth. One a day.    . furosemide (LASIX) 40 MG tablet Take 40 mg by mouth in the morning, at noon, and at bedtime.     . gabapentin (NEURONTIN) 600 MG tablet Take 1 tablet (600 mg total) by mouth at bedtime. 30 tablet 5  . glucose blood (ONETOUCH VERIO) test strip Check blood sugar 3 times a day as instructed 270 each 3  . Insulin Aspart, w/Niacinamide, (FIASP) 100 UNIT/ML SOLN Inject 60 Units into the skin 3 (three) times daily before meals. 6 mL 0  . insulin degludec (TRESIBA FLEXTOUCH) 200 UNIT/ML FlexTouch Pen Inject 130 Units into the skin daily. 12 mL 0  . Insulin Pen Needle 32G X 4 MM MISC Use to inject insulin 4 times a day 400 each 3  . Insulin Syringe-Needle U-100 31G X 15/64" 1 ML MISC Use to inject insulin 1 time a day 100 each 3  . Lancets (ONETOUCH DELICA PLUS [id number]) MISC Check blood sugar 3 times per day. 270 each 3  . losartan (COZAAR) 25 MG tablet Take 1 tablet (25 mg total) by mouth daily. 30 tablet 2  . magnesium oxide (MAG-OX) 400 MG tablet Take 400 mg by mouth daily.     . metoprolol succinate (TOPROL XL) 25 MG 24 hr tablet Take 0.5 tablets (12.5 mg total) by mouth daily. 15 tablet 2  . Misc Natural Products (JOINT HEALTH PO) Take 1 tablet by mouth daily.     . pantoprazole (PROTONIX) 40 MG tablet Take 40 mg by mouth daily.    . sertraline (ZOLOFT) 100 MG tablet Take 200 mg by mouth daily.     . tamsulosin (FLOMAX) 0.4 MG CAPS capsule Take 0.4 mg by mouth daily.    . traMADol (ULTRAM) 50 MG tablet Take 50 mg by mouth 4 (four) times daily as needed for moderate pain or severe pain.     . Vitamin D,  Ergocalciferol, (DRISDOL) 1.25 MG (50000 UNIT) CAPS capsule Take 50,000 Units by mouth every 7 (seven) days.    . cholestyramine (QUESTRAN) 4 g packet Take 1 packet (4 g total) by mouth 3 (three) times daily with meals. 60 each 12  . Na Sulfate-K Sulfate-Mg Sulf (SUPREP BOWEL PREP KIT) 17.5-3.13-1.6 GM/177ML SOLN Take 1 kit by mouth as directed. For colonoscopy prep 354 mL 0   No current facility-administered medications for this visit.    Allergies Allergies  Allergen Reactions  . Loratadine Rash    Histories Past Medical History:  Diagnosis Date  . Anemia   . Benign prostatic hyperplasia   . CAD in native artery   . Cancer ([place])    basal cell  . Depression   . Diabetic neuropathy ([place])   . Diastolic heart failure ([place])   . Edema   . Enlarged prostate   .  GERD (gastroesophageal reflux disease)   . Gout   . Hyperlipidemia   . Hypertension   . Hypogonadism in male   . Muscle cramp   . Osteoarthritis   . Pancytopenia ([place]) 01/02/2015  . Peripheral neuropathy   . 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 Renal insufficiency   . Stage 3 chronic kidney disease ([place])   . Type 2 diabetes with complication [facility])    Past Surgical History:  Procedure Laterality Date  . CATARACT EXTRACTION Bilateral   . CHOLECYSTECTOMY  1981  . [place]  . TONSILLECTOMY  1963   Social History   Socioeconomic History  . Marital status: Single    Spouse name: Not on file  . Number of children: 2  . Years of education: Not on file  . Highest education level: 10th grade  Occupational History  . Not on file  Tobacco Use  . Smoking status: Former Research scientist (life sciences)  . Smokeless tobacco: Never Used  Vaping Use  . Vaping Use: Never used  Substance and Sexual Activity  . Alcohol use: Never  . Drug use: Never  . Sexual activity: Not on file  Other Topics Concern  . Not on file  Social History Narrative   07/14/20 Lives alone   Caffeine- sodas 3 daily   Social  Determinants of Health   Financial Resource Strain:   . Difficulty of Paying Living Expenses: Not on file  Food Insecurity:   . Worried About Charity fundraiser in the Last Year: Not on file  . Ran Out of Food in the Last Year: Not on file  Transportation Needs:   . Lack of Transportation (Medical): Not on file  . Lack of Transportation (Non-Medical): Not on file  Physical Activity:   . Days of Exercise per Week: Not on file  . Minutes of Exercise per Session: Not on file  Stress:   . Feeling of Stress : Not on file  Social Connections:   . Frequency of Communication with Friends and Family: Not on file  . Frequency of Social Gatherings with Friends and Family: Not on file  . Attends Religious Services: Not on file  . Active Member of Clubs or Organizations: Not on file  . Attends Archivist Meetings: Not on file  . Marital Status: Not on file  Intimate Partner Violence:   . Fear of Current or Ex-Partner: Not on file  . Emotionally Abused: Not on file  . Physically Abused: Not on file  . Sexually Abused: Not on file   Family History  Problem Relation Age of Onset  . Diabetes Mother   . Emphysema Mother   . Congestive Heart Failure Mother   . Breast cancer Mother   . Diabetes Father   . Heart disease Father   . Heart attack Father   . Colon cancer Neg Hx   . Stomach cancer Neg Hx   . Pancreatic cancer Neg Hx   . Esophageal cancer Neg Hx   . Inflammatory bowel disease Neg Hx   . Liver disease Neg Hx   . Rectal cancer Neg Hx    I have reviewed his medical, social, and family history in detail and updated the electronic medical record as necessary.    PHYSICAL EXAMINATION  BP 118/60   Pulse 78   Ht '5\' 10"'  (1.778 m)   Wt 268 lb (121.6 kg)   BMI 38.45 kg/m  Wt Readings from Last 3  Encounters:  08/01/20 268 lb (121.6 kg)  08/01/20 265 lb 11.2 oz (120.5 kg)  07/14/20 260 lb (117.9 kg)  GEN: NAD, appears stated age, doesn't appear chronically ill PSYCH:  Cooperative, without pressured speech EYE: Conjunctivae pink, sclerae anicteric ENT: MMM CV: Nontachycardic RESP: Decreased breath sounds at the bases bilaterally but no audible wheezing GI: NABS, soft, protuberant abdomen, nontender, unable to appreciate hepatosplenomegaly as a result of body habitus  MSK/EXT: Bilateral lower extremity edema present SKIN: No jaundice NEURO:  Alert & Oriented x 3, no focal deficits, no evidence of asterixis   REVIEW OF DATA  I reviewed the following data at the time of this encounter:  GI Procedures and Studies  He reports prior upper and lower endoscopy greater than 10 to 15 years prior in [place] does not know the provider however  Laboratory Studies  Reviewed those in epic  Imaging Studies  September 2021 CT abdomen with contrast IMPRESSION: 1. Hepatic cirrhosis with hepatic steatosis with 2 small liver lesions, 1 of which is compatible with a small simple cyst, while the other lesion in segment 7 is indeterminate on today's examination. This lesion may represent a cavernous hemangioma, however, continued attention on follow-up studies is recommended. This is not a hypervascular lesion and is therefore unlikely to represent hepatocellular carcinoma. However, given the patient's cirrhosis, future follow-up examination with abdominal MRI with and without IV gadolinium is recommended in 3-6 months to ensure the stability of this finding and to better monitor for potential HCC. 2. Evidence of portal hypertension as demonstrated by mild dilatation of the portal vein and the presence of splenomegaly.  September 2021 MRI abdomen with contrast IMPRESSION: 1. Hepatic cirrhosis with signs of portal hypertension. 2. Lesions in the liver two displaying "fatty metamorphosis" likely dysplastic nodules, characterized based on LI-RADS criteria as LR category 3, indeterminate. Three to six-month follow-up MRI with multiphase evaluation is suggested.  Current exam quality is compromised by respiratory motion induced by parenchymal lung disease. 3. Pattern of pulmonary abnormalities could be seen in the setting of viral or atypical infection including COVID-19 infection. 4. Patchy bilateral interstitial and airspace disease better demonstrated on recent CT assessment.   ASSESSMENT  [name] is a 64 y.o. male with a pmh significant for hypertension, hyperlipidemia, diabetes, CHF, CAD, gout, osteoarthritis, chronic renal insufficiency, BCC of the skin, MDD, status post cholecystectomy, GERD, recent clinical diagnosis of cirrhosis (manifested by cirrhotic appearing liver, as well as splenomegaly and thrombocytopenia and portal hypertension changes noted on imaging), liver lesions NOS, reported colon polyps.   The patient is seen today for evaluation and management of:  1. Cirrhosis of liver without ascites, unspecified hepatic cirrhosis type ([place])   2. Liver lesion   3. Rectal bleeding   4. Rectal urgency   5. Iron deficiency anemia, unspecified iron deficiency anemia type   6. History of colonic polyps    The patient is hemodynamically stable.  Clinically he also seems to be doing much better than how he was during his hospitalization in September.  With that being said I do have some significant concerns.  We discussed in great detail what cirrhosis is a means for patient.  The implications of his current meld score (as calculated by his last set of numbers in the hospital).  The lesion on the liver although it was considered a light RADS 3, the patient has an elevated AFP.  I would like to discuss his case at [facility] in the next few weeks to decide  if we plan an earlier follow-up image study versus possibility of needing to obtain a biopsy to better clarify if this patient has true hepatoma or HCC.  We will begin a further evaluation for underlying cause/etiology of cirrhosis though I suspect this will be metabolic associated fatty liver  disease/NASH/NAFLD as the most likely cause.  We discussed the role of alcohol abatement of any sort to try and minimize continued risk.  We will check him for his vaccine need for hepatitis A and hepatitis B immunization.  Although he has some lower extremity edema he does not look to have significant ascites.  We will keep him on his current medications and monitor closely.  Diagnostic upper endoscopy to further clarify potential need for nonselective beta-blockade versus esophageal variceal band ligation is reasonable.  We also need to get him up-to-date from a colon cancer screening perspective in the setting of his iron deficiency better understand the etiology of this.  I suspect his bleeding is most likely related to hemorrhoidal disease but we will try to bulk his stools with some fiber supplementation and give him some toileting techniques to optimize him.  A diagnostic/screening colonoscopy will be performed in addition to the diagnostic endoscopy.  The risks and benefits of endoscopic evaluation were discussed with the patient; these include but are not limited to the risk of perforation, infection, bleeding, missed lesions, lack of diagnosis, severe illness requiring hospitalization, as well as anesthesia and sedation related illnesses.  The patient is agreeable to proceed.  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  He will be available however on weekends if necessary to try and complete any additional work-up.  He understands potential need for liver biopsy depending on final [place] discussion.  All patient questions were answered to the best of my ability, and the patient agrees to the aforementioned plan of action with follow-up as indicated.   PLAN  Laboratories as outlined below Proceed with scheduling EGD and colonoscopy Initiate FiberCon  once daily to bulk stools may increase to twice daily Initiate cholestyramine 3 times daily to see if he could have underlying postcholecystectomy diarrhea/bile salt diarrhea Discuss case at [facility] in the next 2 to 3 weeks to decide on need for liver biopsy or repeat imaging for LiRADS3 lesion with elevated AFP  #ESLD Management Volume -Lasix as per PCP dosing and being monitored; consider spironolactone if additional anasarca develops or need for further antihypertensive -Monitor weight 2-3 times weekly -1500-2000 mg Na diet Infection -No evidence of significant ascites so SBP unlikely Bleeding -We will get up-to-date with an EGD soon Encephalopathy -None Screening -Liver lesion noted as a LiRads 3 on recent CT and MRI with elevated AFP so will need discussion at [facility] Transplant -Briefly discussed though too early for [place] to know whether this is something he will need and he has a low meld from last evaluation Vaccination -HAV Immunity, HBV Immunity to be checked -PCP should have patient undergo Influenza, Pneumococcal vaccinations --Be sure to give Prevnar-13 and in 8-weeks Pneumovax-23 Other -Promote intake of 1.5 g/kg/day of Protein (Ensures/Boosts at each meal)   Orders Placed This Encounter  Procedures  . IBC + Ferritin  . INR/PT  . AFP tumor marker  . Hepatitis A antibody, total  . Hepatitis B Surface AntiBODY  . Hepatitis B core  antibody, total  . Gamma GT  . Ambulatory referral to Gastroenterology    New Prescriptions   CHOLESTYRAMINE Lucrezia Starch) 4 G PACKET    Take 1 packet (4 g total) by mouth 3 (three) times daily with meals.   NA SULFATE-K SULFATE-MG SULF (SUPREP BOWEL PREP KIT) 17.5-3.13-1.6 GM/177ML SOLN    Take 1 kit by mouth as directed. For colonoscopy prep   Modified Medications   No medications on file    Planned Follow Up No follow-ups on file.   Total Time in Face-to-Face and in Coordination of Care for patient including independent/personal  interpretation/review of prior testing, medical history, examination, medication adjustment, communicating results with the patient directly, and documentation with the EHR is 60 minutes.   Justice Britain, MD [facility] Advanced [facility]oscopy Office # [phone]

## 2020-08-01 NOTE — Patient Instructions (Addendum)
Your provider has requested that you go to the basement level for lab work before leaving today. Press "B" on the elevator. The lab is located at the first door on the left as you exit the elevator.  Continue Pantoprazole as directed.   We have sent the following medications to your pharmacy for you to pick up at your convenience: Suprep , Questran   START: Fiber con once daily. (over the counter)   START: Questran 4grams three times daily. ( prescription sent to pharmacy)      _x  _   INSULIN (LONG ACTING) MEDICATION INSTRUCTIONS (Lantus, NPH, 70/30, Humulin, Novolin-N, Levemir, Toujeo, Tresiba )   The day before your procedure:  Take  your regular evening dose    The day of your procedure:  Do not take your morning dose     You have been scheduled for an endoscopy and colonoscopy. Please follow the written instructions given to you at your visit today. Please pick up your prep supplies at the pharmacy within the next 1-3 days. If you use inhalers (even only as needed), please bring them with you on the day of your procedure.   Due to recent changes in healthcare laws, you may see the results of your imaging and laboratory studies on MyChart before your provider has had a chance to review them.  We understand that in some cases there may be results that are confusing or concerning to you. Not all laboratory results come back in the same time frame and the provider may be waiting for multiple results in order to interpret others.  Please give [place] 48 hours in order for your provider to thoroughly review all the results before contacting the office for clarification of your results.   Thank you for choosing me and [facility].  Dr. Rush Landmark

## 2020-08-02 ENCOUNTER — Encounter: Payer: Self-pay | Admitting: Gastroenterology

## 2020-08-02 DIAGNOSIS — R152 Fecal urgency: Secondary | ICD-10-CM

## 2020-08-02 DIAGNOSIS — Z860100 Personal history of colon polyps, unspecified: Secondary | ICD-10-CM

## 2020-08-02 DIAGNOSIS — K769 Liver disease, unspecified: Secondary | ICD-10-CM

## 2020-08-02 DIAGNOSIS — K625 Hemorrhage of anus and rectum: Secondary | ICD-10-CM

## 2020-08-02 HISTORY — DX: Liver disease, unspecified: K76.9

## 2020-08-02 HISTORY — DX: Hemorrhage of anus and rectum: K62.5

## 2020-08-02 HISTORY — DX: Personal history of colon polyps, unspecified: Z86.0100

## 2020-08-02 HISTORY — DX: Fecal urgency: R15.2

## 2020-08-02 HISTORY — DX: Personal history of colonic polyps: Z86.010

## 2020-08-02 LAB — BMP8+ANION GAP
Anion Gap: 15 mmol/L (ref 10.0–18.0)
BUN/Creatinine Ratio: 23 (ref 10–24)
BUN: 28 mg/dL — ABNORMAL HIGH (ref 8–27)
CO2: 25 mmol/L (ref 20–29)
Calcium: 9.3 mg/dL (ref 8.6–10.2)
Chloride: 99 mmol/L (ref 96–106)
Creatinine, Ser: 1.24 mg/dL (ref 0.76–1.27)
GFR calc Af Amer: 70 mL/min/{1.73_m2} (ref >59)
GFR calc non Af Amer: 61 mL/min/{1.73_m2} (ref >59)
Glucose: 268 mg/dL — ABNORMAL HIGH (ref 65–99)
Potassium: 5.2 mmol/L (ref 3.5–5.2)
Sodium: 139 mmol/L (ref 134–144)

## 2020-08-03 ENCOUNTER — Encounter: Payer: Self-pay | Admitting: Gastroenterology

## 2020-08-04 ENCOUNTER — Other Ambulatory Visit (INDEPENDENT_AMBULATORY_CARE_PROVIDER_SITE_OTHER): Payer: Medicare HMO

## 2020-08-04 DIAGNOSIS — D649 Anemia, unspecified: Secondary | ICD-10-CM

## 2020-08-04 DIAGNOSIS — K7460 Unspecified cirrhosis of liver: Secondary | ICD-10-CM

## 2020-08-04 LAB — HEPATIC FUNCTION PANEL
ALT: 43 U/L (ref 0–53)
AST: 46 U/L — ABNORMAL HIGH (ref 0–37)
Albumin: 4.1 g/dL (ref 3.5–5.2)
Alkaline Phosphatase: 110 U/L (ref 39–117)
Bilirubin, Direct: 0.2 mg/dL (ref 0.0–0.3)
Total Bilirubin: 0.5 mg/dL (ref 0.2–1.2)
Total Protein: 7 g/dL (ref 6.0–8.3)

## 2020-08-04 LAB — HEPATITIS B CORE ANTIBODY, TOTAL: Hep B Core Total Ab: NONREACTIVE

## 2020-08-04 LAB — HEPATITIS B SURFACE ANTIBODY,QUALITATIVE: Hep B S Ab: NONREACTIVE

## 2020-08-04 LAB — AFP TUMOR MARKER: AFP-Tumor Marker: 33.9 ng/mL — ABNORMAL HIGH (ref <6.1)

## 2020-08-04 LAB — HEPATITIS A ANTIBODY, TOTAL: Hepatitis A AB,Total: NONREACTIVE

## 2020-08-04 NOTE — Assessment & Plan Note (Signed)
Patient reports that he continues to have diffuse joint stiffness in multiple joints including his hands, elbows, shoulders, hips, and knees.  States that it is worse in the morning, last for 3 to 4 hours at a time.  Denies any trauma.  Reports that this has been going on for years and is worse with activity throughout the day.  He states that he is currently on meloxicam daily, gabapentin, and colchicine. On chart review it appears that he has seen rheumatology in the past for positive RF factor.  LDH, copper, anti-CCP, ESR, and CRP were all normal at that time.  Also noted to have thrombocytopenia at that time.  He was then seen on 9/28 with complaints of worsening joint pain of the larger joints.  He was started on colchicine 0.6 mg twice daily at that time.  X-ray of the right knee and left knee showed mild chondrocalcinosis without evidence of acute osteoabnormality.  X-ray of the elbow showed mild elbow joint degenerative changes, extra-articular calcification. He reports being on the meloxicam daily for a few years now and is on gabapentin for neuropathy.  Patient has a history of hypertension, CAD, stenosis, diabetes, and CKD stage III.  We discussed the risk of continuing meloxicam daily, including GI bleeding, worsening coronary disease, worsening kidney function, and other complications.  Differential for his diffuse joint pains still remain broad, RA still possible however he also may have had a false positive RF.  Osteoarthritis and pseudogout remain on the differential.  Discussed trialing off meloxicam, starting Voltaren gel, and physical therapy to see if this helps.  Discussed that we can use meloxicam as needed for flares of his pain.  -Discontinue meloxicam, can use an acute flares -Continue gabapentin for neuropathy -Continue colchicine for possible pseudogout -Start Voltaren gel as needed -Physical therapy referral

## 2020-08-04 NOTE — Assessment & Plan Note (Signed)
Patient is currently on Tresiba 130 units daily and Fiasp 60 units 3 times daily with meals.  Currently follows with endocrinology states that they are trying to get a insulin pump for him.  Glucometer readings showed he is above target 88.9% of the time, within range 11.1% of the time, under target 0% of the time.  He does not have a follow-up with endocrinology until later this month.  We discussed increasing his [place].  Blood sugar still not at goal.  -Increase Tresiba 150 units daily -Continue Fiasp 60 units TID WC -Continue following up with endocrinology

## 2020-08-04 NOTE — Assessment & Plan Note (Signed)
Patient is currently on losartan 25 mg daily, metoprolol 25 mg twice daily.  He denies any issues taking his medications.  Initial blood pressure 147/64, repeats were 105/56, and 125/64.  Blood pressure appears well controlled at this time.  No changes for now. -Continue losartan 25 mg daily -Continue metoprolol 12.5 mg daily -Repeat BMP today

## 2020-08-05 NOTE — Progress Notes (Signed)
Internal Medicine Clinic Attending ° °Case discussed with Dr. Krienke  At the time of the visit.  We reviewed the resident’s history and exam and pertinent patient test results.  I agree with the assessment, diagnosis, and plan of care documented in the resident’s note.  °

## 2020-08-09 DIAGNOSIS — R531 Weakness: Secondary | ICD-10-CM | POA: Diagnosis not present

## 2020-08-11 ENCOUNTER — Encounter: Payer: Self-pay | Admitting: *Deleted

## 2020-08-12 ENCOUNTER — Telehealth: Payer: Self-pay | Admitting: Dietician

## 2020-08-12 DIAGNOSIS — R531 Weakness: Secondary | ICD-10-CM | POA: Diagnosis not present

## 2020-08-12 NOTE — Telephone Encounter (Signed)
Would like an appointment on 08/18/20. Needs refills- he will call [facility]. He says he was recently awarded [employer].

## 2020-08-13 ENCOUNTER — Other Ambulatory Visit: Payer: Self-pay

## 2020-08-13 ENCOUNTER — Telehealth: Payer: Self-pay | Admitting: Gastroenterology

## 2020-08-13 NOTE — Telephone Encounter (Signed)
Patient's case was discussed at [facility] this morning. Consensus is that the lesions remain indeterminate. With the elevation in AFP significantly over the last 2 months my degree of concern for potential underlying [place] has increased. Consensus was that we should move forward with repeat imaging before potential biopsy. If repeat imaging is definite in regards to LiRads criteria 1 where the other would may not need biopsy but if it remains indeterminate then biopsy by interventional radiology would likely be next step. Patient appreciative for the call back. 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 He is appreciative for all the care is received through [facility] health.  Plan will be the following: 1) MRI abdomen with and without contrast to better define liver lesions and rule out [place], please move forward with getting this scheduled. FYI Dr. Quin Hoop, MD [facility] Gastroenterology Advanced Endoscopy Office # [phone]

## 2020-08-13 NOTE — Telephone Encounter (Signed)
Left message on machine to call back  

## 2020-08-14 ENCOUNTER — Other Ambulatory Visit: Payer: Self-pay

## 2020-08-14 DIAGNOSIS — K769 Liver disease, unspecified: Secondary | ICD-10-CM

## 2020-08-14 NOTE — Telephone Encounter (Signed)
Mail box full will try later  

## 2020-08-14 NOTE — Telephone Encounter (Signed)
MRI scheduled at [facility] radiology on 08/25/20 at 8 am arrive at 730 am nothing to eat or drink after midnight.

## 2020-08-14 NOTE — Telephone Encounter (Signed)
The patient has been notified of this information and all questions answered.

## 2020-08-17 ENCOUNTER — Telehealth: Payer: Self-pay | Admitting: Internal Medicine

## 2020-08-17 NOTE — Telephone Encounter (Signed)
Internal [facility] Line Telephone Encounter  Time: 1:45 PM   Pt noting severe interscapular pain since last evening while getting ready for bed. He notes that he has had worsening shortness of breath since that time. He reports that his skin is more pale than usual. He does not feel lightheaded but notes "just not feeling good" He attributes this to a pneumonia that he thinks he might have and is requesting an appointment with his PCP, Dr. Marianna Payment, tomorrow in the clinic.  Discussed with patient that his symptoms can be seen in a broad number of processes, some of which are medical emergencies and that . Encouraged pt to seek immediate evaluation in the ED for ongoing evaluation.  Mitzi Hansen, MD Internal Medicine Resident PGY-2 [facility] Pager: [phone] 08/17/2020 1:51 PM

## 2020-08-18 ENCOUNTER — Encounter: Payer: Self-pay | Admitting: Neurology

## 2020-08-18 ENCOUNTER — Ambulatory Visit: Payer: Medicare HMO | Admitting: Dietician

## 2020-08-18 ENCOUNTER — Encounter: Payer: Self-pay | Admitting: Dietician

## 2020-08-18 ENCOUNTER — Ambulatory Visit: Payer: Medicare HMO | Admitting: Neurology

## 2020-08-18 ENCOUNTER — Telehealth: Payer: Self-pay | Admitting: *Deleted

## 2020-08-18 ENCOUNTER — Other Ambulatory Visit: Payer: Self-pay

## 2020-08-18 ENCOUNTER — Telehealth: Payer: Self-pay | Admitting: Dietician

## 2020-08-18 ENCOUNTER — Ambulatory Visit (INDEPENDENT_AMBULATORY_CARE_PROVIDER_SITE_OTHER): Payer: Medicare HMO | Admitting: Internal Medicine

## 2020-08-18 ENCOUNTER — Encounter: Payer: Self-pay | Admitting: Internal Medicine

## 2020-08-18 VITALS — BP 160/71 | HR 78 | Ht 71.0 in | Wt 262.0 lb

## 2020-08-18 VITALS — BP 147/65 | HR 82 | Temp 98.2°F | Ht 71.0 in | Wt 265.0 lb

## 2020-08-18 DIAGNOSIS — G4719 Other hypersomnia: Secondary | ICD-10-CM

## 2020-08-18 DIAGNOSIS — R0681 Apnea, not elsewhere classified: Secondary | ICD-10-CM

## 2020-08-18 DIAGNOSIS — Z8701 Personal history of pneumonia (recurrent): Secondary | ICD-10-CM

## 2020-08-18 DIAGNOSIS — E669 Obesity, unspecified: Secondary | ICD-10-CM | POA: Diagnosis not present

## 2020-08-18 DIAGNOSIS — K7460 Unspecified cirrhosis of liver: Secondary | ICD-10-CM

## 2020-08-18 DIAGNOSIS — R600 Localized edema: Secondary | ICD-10-CM

## 2020-08-18 DIAGNOSIS — R351 Nocturia: Secondary | ICD-10-CM | POA: Diagnosis not present

## 2020-08-18 DIAGNOSIS — R0683 Snoring: Secondary | ICD-10-CM | POA: Diagnosis not present

## 2020-08-18 DIAGNOSIS — R519 Headache, unspecified: Secondary | ICD-10-CM

## 2020-08-18 LAB — PROTIME-INR
INR: 1.1 (ref 0.8–1.2)
Prothrombin Time: 13.8 seconds (ref 11.4–15.2)

## 2020-08-18 MED ORDER — SPIRONOLACTONE 100 MG PO TABS: 100.0000 mg | ORAL_TABLET | Freq: Every day | ORAL | 5 refills | Status: AC

## 2020-08-18 MED ORDER — FUROSEMIDE 40 MG PO TABS: 40.0000 mg | ORAL_TABLET | Freq: Every day | ORAL | 5 refills | Status: AC

## 2020-08-18 NOTE — Patient Instructions (Signed)

## 2020-08-18 NOTE — Patient Instructions (Addendum)
.  [name],  It was a pleasure to see you today. Thank you for coming in.   Today we discussed your shortness of breath, abdominal bloating, and leg swelling. This seems like it may be related to your liver disease. In regards to this please resume the lasix 40 mg daily and spironolactone 100 mg daily. I am checking some labs and will contact you if they are abnormal. Please return to clinic in 1 week.    Thank you again for coming in.   Asencion Noble.D.

## 2020-08-18 NOTE — Progress Notes (Signed)
Subjective:    Patient ID: Nicholas Rice is a 64 y.o. male.  HPI     Star Age, MD, PhD [facility] [address] [place], [place] [address]  Dear Drs. Jacelyn Grip,   I saw your patient, Nicholas Rice, upon your kind request, in my Clinic clinic today for initial consultation on his sleep disorder, in particular, concern for underlying obstructive sleep apnea.  The patient is unaccompanied today.  As you know, Nicholas Rice is a 64 year old right-handed gentleman with an underlying medical history of anemia, coronary artery disease, basal cell cancer, depression, edema, enlarged prostate, reflux disease, gout, hypertension, hyperlipidemia, hypogonadism, arthritis, renal insufficiency, type 2 diabetes, neuropathy, memory loss (followed by my colleague, Dr. Leta Baptist), and obesity, who reports snoring and excessive daytime somnolence.  I reviewed your office note from 07/14/2020.  His Epworth sleepiness score is 15/24, fatigue severity score is 20 out of 63.  His girlfriend has noted pauses in his breathing while he is asleep.  He had a tonsillectomy as a child, he does not have a family history of sleep apnea.  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  He is a restless sleeper.  He is widowed and lives alone.  He has 2 grown children, 1 son, 1 daughter.  He drinks caffeine in the form of soda, about 3 cans/day and occasional tea sweetened with Stevia.  He has had nasal congestion.  He is a mouth breather.  He does not have a TV in the bedroom.  He reports sleep disruption, has nocturia about once per average night and has had occasional morning headaches.  Bedtime is generally between 11 and midnight.  He does not currently have a pet but is looking into getting a puppy.  He has had lower extremity swelling.  He was hospitalized in early September for pneumonia.  He quit smoking in 2001.  His weight has been fluctuating up  and down about 5 pounds in general.  His Past Medical History Is Significant For: Past Medical History:  Diagnosis Date  . Anemia   . Benign prostatic hyperplasia   . CAD in native artery   . Cancer ([place])    basal cell  . Depression   . Diabetic neuropathy ([place])   . Diastolic heart failure ([place])   . Edema   . Enlarged prostate   . GERD (gastroesophageal reflux disease)   . Gout   . Hyperlipidemia   . Hypertension   . Hypogonadism in male   . Muscle cramp   . Osteoarthritis   . Pancytopenia ([place]) 01/02/2015  . Peripheral neuropathy   . Proteinuria due to type 2 diabetes mellitus ([place])   . Renal insufficiency   . Stage 3 chronic kidney disease ([place])   . Type 2 diabetes with complication (HCC)     His Past Surgical History Is Significant For: Past Surgical History:  Procedure Laterality Date  . CATARACT EXTRACTION Bilateral   . CHOLECYSTECTOMY  1981  . [place]  . TONSILLECTOMY  1963    His Family History Is Significant For: Family History  Problem Relation Age of Onset  . Diabetes Mother   . Emphysema Mother   . Congestive Heart Failure Mother   . Breast cancer Mother   . Diabetes Father   . Heart disease Father   . Heart attack Father   . Colon cancer Neg Hx   .  Stomach cancer Neg Hx   . Pancreatic cancer Neg Hx   . Esophageal cancer Neg Hx   . Inflammatory bowel disease Neg Hx   . Liver disease Neg Hx   . Rectal cancer Neg Hx     His Social History Is Significant For: Social History   Socioeconomic History  . Marital status: Single    Spouse name: Not on file  . Number of children: 2  . Years of education: Not on file  . Highest education level: 10th grade  Occupational History  . Not on file  Tobacco Use  . Smoking status: Former Research scientist (life sciences)  . Smokeless tobacco: Never Used  Vaping Use  . Vaping Use: Never used  Substance and Sexual Activity  . Alcohol use: Never  . Drug use: Never  . Sexual activity: Not on file  Other Topics  Concern  . Not on file  Social History Narrative   07/14/20 Lives alone   Caffeine- sodas 3 daily   Social Determinants of Health   Financial Resource Strain:   . Difficulty of Paying Living Expenses: Not on file  Food Insecurity:   . Worried About Charity fundraiser in the Last Year: Not on file  . Ran Out of Food in the Last Year: Not on file  Transportation Needs:   . Lack of Transportation (Medical): Not on file  . Lack of Transportation (Non-Medical): Not on file  Physical Activity:   . Days of Exercise per Week: Not on file  . Minutes of Exercise per Session: Not on file  Stress:   . Feeling of Stress : Not on file  Social Connections:   . Frequency of Communication with Friends and Family: Not on file  . Frequency of Social Gatherings with Friends and Family: Not on file  . Attends Religious Services: Not on file  . Active Member of Clubs or Organizations: Not on file  . Attends Archivist Meetings: Not on file  . Marital Status: Not on file    His Allergies Are:  Allergies  Allergen Reactions  . Loratadine Rash  :   His Current Medications Are:  Outpatient Encounter Medications as of 08/18/2020  Medication Sig  . albuterol (PROVENTIL) (2.5 MG/3ML) 0.083% nebulizer solution Take 3 mLs (2.5 mg total) by nebulization every 4 (four) hours as needed for wheezing or shortness of breath.  . APPLE CIDER VINEGAR PO Take 1 tablet by mouth daily.   Marland Kitchen atorvastatin (LIPITOR) 40 MG tablet Take 40 mg by mouth daily.  Marland Kitchen b complex vitamins capsule Take 1 capsule by mouth daily.  . Blood Glucose Monitoring Suppl (ONETOUCH VERIO) w/Device KIT Check blood sugar 4 times daily  . cholestyramine (QUESTRAN) 4 g packet Take 1 packet (4 g total) by mouth 3 (three) times daily with meals.  . colchicine 0.6 MG tablet Take 1 tablet (0.6 mg total) by mouth 2 (two) times daily.  . diclofenac Sodium (VOLTAREN) 1 % GEL Apply 2 g topically 4 (four) times daily.  . furosemide (LASIX)  40 MG tablet Take 40 mg by mouth in the morning, at noon, and at bedtime.   . gabapentin (NEURONTIN) 600 MG tablet Take 1 tablet (600 mg total) by mouth at bedtime.  Marland Kitchen glucose blood (ONETOUCH VERIO) test strip Check blood sugar 3 times a day as instructed  . Insulin Aspart, w/Niacinamide, (FIASP) 100 UNIT/ML SOLN Inject 60 Units into the skin 3 (three) times daily before meals.  . insulin degludec (TRESIBA FLEXTOUCH)  200 UNIT/ML FlexTouch Pen Inject 130 Units into the skin daily.  . Insulin Pen Needle 32G X 4 MM MISC Use to inject insulin 4 times a day  . Insulin Syringe-Needle U-100 31G X 15/64" 1 ML MISC Use to inject insulin 1 time a day  . Lancets (ONETOUCH DELICA PLUS [id number]) MISC Check blood sugar 3 times per day.  . losartan (COZAAR) 25 MG tablet Take 1 tablet (25 mg total) by mouth daily.  . magnesium oxide (MAG-OX) 400 MG tablet Take 400 mg by mouth daily.   . metoprolol succinate (TOPROL XL) 25 MG 24 hr tablet Take 0.5 tablets (12.5 mg total) by mouth daily.  . Misc Natural Products (JOINT HEALTH PO) Take 1 tablet by mouth daily.   . pantoprazole (PROTONIX) 40 MG tablet Take 40 mg by mouth daily.  . sertraline (ZOLOFT) 100 MG tablet Take 200 mg by mouth daily.   . tamsulosin (FLOMAX) 0.4 MG CAPS capsule Take 0.4 mg by mouth daily.  . traMADol (ULTRAM) 50 MG tablet Take 50 mg by mouth 4 (four) times daily as needed for moderate pain or severe pain.   . [DISCONTINUED] ferrous sulfate 325 (65 FE) MG EC tablet Take 325 mg by mouth. One a day.  . [DISCONTINUED] Vitamin D, Ergocalciferol, (DRISDOL) 1.25 MG (50000 UNIT) CAPS capsule Take 50,000 Units by mouth every 7 (seven) days.  . Na Sulfate-K Sulfate-Mg Sulf (SUPREP BOWEL PREP KIT) 17.5-3.13-1.6 GM/177ML SOLN Take 1 kit by mouth as directed. For colonoscopy prep (Patient not taking: Reported on 08/18/2020)   No facility-administered encounter medications on file as of 08/18/2020.  :  Review of Systems:  Out of a complete 14 point  review of systems, all are reviewed and negative with the exception of these symptoms as listed below: Review of Systems  Neurological:       Pt presents today to discuss his sleep. Pt has had a sleep study in the past but has never used a cpap. Pt does endorse snoring.  Epworth Sleepiness Scale 0= would never doze 1= slight chance of dozing 2= moderate chance of dozing 3= high chance of dozing  Sitting and reading: 3 Watching TV: 2 Sitting inactive in a public place (ex. Theater or meeting): 2 As a passenger in a car for an hour without a break: 1 Lying down to rest in the afternoon: 3 Sitting and talking to someone: 1 Sitting quietly after lunch (no alcohol): 2 In a car, while stopped in traffic: 1 Total: 15     Objective:  Neurological Exam  Physical Exam Physical Examination:   Vitals:   08/18/20 1118  BP: (!) 160/71  Pulse: 78    General Examination: The patient is a very pleasant 64 y.o. male in no acute distress. He appears well-developed and well-nourished and well groomed.   HEENT: Normocephalic, atraumatic, pupils are equal, round and reactive to light, extraocular tracking is good without limitation to gaze excursion or nystagmus noted. Hearing is impaired.  Face is symmetric with normal facial animation. Speech is clear with no dysarthria noted. There is no hypophonia. There is no lip, neck/head, jaw or voice tremor. Neck is supple with full range of passive and active motion. There are no carotid bruits on auscultation. Oropharynx exam reveals: moderate mouth dryness, edentulous on top and several missing teeth on the bottom, he has a partial plate but does not use it.  He has mild airway crowding secondary to small airway entry and slightly wider uvula, Mallampati class  II, neck circumference of 19-1/2 inches.  Tongue protrudes centrally in palate elevates symmetrically.   Chest: Clear to auscultation without wheezing, rhonchi or crackles noted.  Heart: S1+S2+0,  regular and normal without murmurs, rubs or gallops noted.   Abdomen: Soft, non-tender and non-distended with normal bowel sounds appreciated on auscultation.  Extremities: There is 2+ pitting edema in the distal lower extremities bilaterally.   Skin: Warm and dry without trophic changes noted.   Musculoskeletal: exam reveals no obvious joint deformities, tenderness or joint swelling or erythema.   Neurologically:  Mental status: The patient is awake, alert and oriented in all 4 spheres. His immediate and remote memory, attention, language skills and fund of knowledge are appropriate. There is no evidence of aphasia, agnosia, apraxia or anomia. Speech is clear with normal prosody and enunciation. Thought process is linear. Mood is normal and affect is normal.  Cranial nerves II - XII are as described above under HEENT exam.  Motor exam: Normal bulk, strength and tone is noted. There is no tremor, Romberg is not tested due to safety concerns, he feels somewhat insecure standing narrow based, fine motor skills grossly intact.  Cerebellar testing: No dysmetria or intention tremor. There is no truncal or gait ataxia.  Sensory exam: intact to light touch in the upper and lower extremities.  Gait, station and balance: He stands with mild difficulty.  He stands slightly wide-based.  He walks slightly slowly and cautiously, no limp, has preserved arm swing.    Assessment and Plan:  In summary, Nicholas Rice is a very pleasant 64 y.o.-year old male with an underlying medical history of anemia, coronary artery disease, basal cell cancer, depression, edema, enlarged prostate, reflux disease, gout, hypertension, hyperlipidemia, hypogonadism, arthritis, renal insufficiency, type 2 diabetes, neuropathy, memory loss (followed by my colleague, Dr. Leta Baptist), and obesity, whose history and physical exam are concerning for obstructive sleep apnea (OSA). I had a long chat with the patient about my findings and the  diagnosis of OSA, its prognosis and treatment options. We talked about medical treatments, surgical interventions and non-pharmacological approaches. I explained in particular the risks and ramifications of untreated moderate to severe OSA, especially with respect to developing cardiovascular disease down the Road, including congestive heart failure, difficult to treat hypertension, cardiac arrhythmias, or stroke. Even type 2 diabetes has, in part, been linked to untreated OSA. Symptoms of untreated OSA include daytime sleepiness, memory problems, mood irritability and mood disorder such as depression and anxiety, lack of energy, as well as recurrent headaches, especially morning headaches. We talked about trying to maintain a healthy lifestyle in general, as well as the importance of weight control. We also talked about the importance of good sleep hygiene. I recommended the following at this time: sleep study.  I explained the sleep test procedure to the patient and also outlined possible surgical and non-surgical treatment options of OSA. I also explained the CPAP treatment option to the patient, who indicated that he would be willing to try CPAP if the need arises. I explained the importance of being compliant with PAP treatment, not only for insurance purposes but primarily to improve His symptoms, and for the patient's long term health benefit, including to reduce His cardiovascular risks. I answered all his questions today and the patient was in agreement. I plan to see him back after the sleep study is completed and encouraged him to call with any interim questions, concerns, problems or updates.   Thank you very much for allowing me to  participate in the care of this nice patient. If I can be of any further assistance to you please do not hesitate to call me at [phone].  Sincerely,   Star Age, MD, PhD

## 2020-08-18 NOTE — Telephone Encounter (Signed)
Nicholas Rice presents asking for insulin samples. He did not qualify for Novo or [employer] patient assistance to help with his cost of insulin. He states his insulin copays are too expensive, but does not know how much they cost. He states he recently was awarded [facility], however our front office states it only covers his Medicare part B. He also states he has Medicare extra help. However, his pharmacy was not aware of his [place] Medicaid.  I asked him to find out how much his new insulin will cost him and I would route this to his doctor.  He is seeing endocrinology for his diabetes and has an appointment next week.

## 2020-08-18 NOTE — Progress Notes (Signed)
    Medical Nutrition Therapy:  Appt start time: 5643 end time:  1345 Total time: 30 Visit # 4  Assessment:  Primary concerns today: taking care of his diabetes.  Mr. Pandit is here for follow up for carb counting, diabetes meal planning, low sodium and high protein for his liver cirrhosis. Mr. Panas states: he is gaining weight.legs are swollen and he does not know why he is gaining as he is eating no added salt diet. Had severe pain between his should blades and base of neck.  ANTHROPOMETRICS: Estimated body mass index is 36.92 kg/m as calculated from the following:   Height as of an earlier encounter on 08/18/20: 5\' 11"  (1.803 m).   Weight as of this encounter: 264 lb 11.2 oz (120.1 kg).  WEIGHT HISTORY:  Wt Readings from Last 5 Encounters:  08/18/20 265 lb (120.2 kg)  08/18/20 264 lb 11.2 oz (120.1 kg)  08/18/20 262 lb (118.8 kg)  08/01/20 268 lb (121.6 kg)  08/01/20 265 lb 11.2 oz (120.5 kg)   MEDICATIONS: asking for samples of insulin saying his insulin is not affordable;has about 3 days of both FIASP and Tresiba left. He also states he got extra help and recently was awarded [facility].   BLOOD SUGAR: he did not bring a meter today. He states his highest blood sugars are it the 200s and they are better on his new insulin regimen.   DIETARY INTAKE: reports eating at home 90% of the time, he doe snot add any salt to his foods 24-hr recall:  B (10-11 AM)- homemade sausage patties x2, 2-3 eggs, glass of 2% milk  L ( 2-4 PM)- [place] & cheese sandwich on light bread   D ( PM)- white rice, beef strips, hot dog, guacamole with hot peppers Snk ( PM)- microwave popcorn  Typical day? Yes.     Usual physical activity: mows lawn with ride mower, walks  Progress Towards Goal(s):  Some progress.   Nutritional Diagnosis:  NB-1.1 Food and nutrition-related knowledge deficit As related to lack of sufficient prior knowledge of diabetes self management is improving gradually  As evidenced  by his report and eagerness to learn today.    Intervention:  Nutrition education limited to encouraging a low saodium today as patient was more concerned about insulin samples and his swelling.  Action Goal: check blood sugar 4 times a day aim for 45-60 grams of carb per meal, 2 gram sodium per day  Outcome goal: improved knowledge of diabetes self management and blood sugars Coordination of care: Patient was discussed with triage and transferred to his doctor's appointment.  Teaching Method Utilized: Visual, Auditory,Hands on Handouts given during visit include: carb counting and meal planning guide Barriers to learning/adherence to lifestyle change: material resources Demonstrated degree of understanding via:  Teach Back   Monitoring/Evaluation:  Dietary intake, exercise, reader, and body weight in 3 week(s)  Debera Lat, RD 08/18/2020 4:15 PM.

## 2020-08-18 NOTE — Telephone Encounter (Signed)
Pt came for appt w/ donnap., he tells her his lower legs are swollen, harder to breathe, pain in neck, gaining weight. Ask to see doctor appt [phone] dr Sherry Ruffing

## 2020-08-18 NOTE — Assessment & Plan Note (Signed)
This is a 64 year old male with history of hypertension, diabetes, CAD, and recently diagnosed hepatic cirrhosis currently being worked up for concern of [facility] presenting with concerns of bilateral lower extremity swelling, shortness of breath, and weight gain over the past 2 weeks. He also endorses some abdominal bloating and a productive cough with whitish/beige sputum production. He denies any fevers, chills, nausea, vomiting, headaches, lightheadedness, dizziness, chest pain, palpitations, or other symptoms. Vitals appear stable, mildly hypertensive at 147/65, O2 saturation 98%. On exam he appears comfortable, lungs are CTA BL, cardiac exam unremarkable, abdominal exam showed a diffuse abdomen, no obvious fluid wave or skin lesions. He has 1+ bilateral pitting edema up to the knees.  Patient states that he is not on Lasix, states that it was discontinued on a prior visit. On chart review it does not appear that this was supposed to be stopped. Patient has a history of diastolic heart failure and hepatic cirrhosis, his lower extremity swelling and abdominal distention could be from fluid overload from his HF vs cirrhosis. His recent MRI abdomen in Sept 21 showed no signs of portal hypertension. Patient is also hemodynamically stable at this time. No signs of infection at this time, and pulmonary exam is reassuring. We will restart the Lasix and add spironolactone and have close follow-up.  -Lasix 40 mg daily -Spinal lactone 100 mg daily -Check CMP and PT-INR -RTC in 1 week

## 2020-08-18 NOTE — Progress Notes (Signed)
   CC: LE swelling, SOB, weight gain  HPI:  [name] is a 64 y.o. with the history listed below presenting for LE swelling, SOB, and weight gain.   Past Medical History:  Diagnosis Date  . Anemia   . Benign prostatic hyperplasia   . CAD in native artery   . Cancer ([place])    basal cell  . Depression   . Diabetic neuropathy ([place])   . Diastolic heart failure ([place])   . Edema   . Enlarged prostate   . GERD (gastroesophageal reflux disease)   . Gout   . Hyperlipidemia   . Hypertension   . Hypogonadism in male   . Muscle cramp   . Osteoarthritis   . Pancytopenia ([place]) 01/02/2015  . Peripheral neuropathy   . Proteinuria due to type 2 diabetes mellitus ([place])   . Renal insufficiency   . Stage 3 chronic kidney disease ([place])   . Type 2 diabetes with complication (HCC)    Review of Systems:   Constitutional: Negative for chills and fever.  Respiratory: Positive for shortness of breath and cough.   Cardiovascular: Negative for chest pain. Positive for leg swelling.  Gastrointestinal: Negative for abdominal pain, nausea and vomiting. Positive for abdominal bloating.  Neurological: Negative for dizziness and headaches.    Physical Exam:  Vitals:   08/18/20 1405  BP: (!) 147/65  Pulse: 82  Temp: 98.2 F (36.8 C)  SpO2: 98%  Weight: 265 lb (120.2 kg)  Height: 5\' 11"  (1.803 m)   Physical Exam Constitutional:      General: He is not in acute distress.    Appearance: He is obese.  HENT:     Head: Normocephalic.     Mouth/Throat:     Mouth: Mucous membranes are moist.     Pharynx: Oropharynx is clear.  Eyes:     Extraocular Movements: Extraocular movements intact.     Conjunctiva/sclera: Conjunctivae normal.  Cardiovascular:     Rate and Rhythm: Normal rate.     Pulses: Normal pulses.     Heart sounds: Normal heart sounds.  Pulmonary:     Effort: Pulmonary effort is normal. No respiratory distress.     Breath sounds: Normal breath sounds. No wheezing, rhonchi or  rales.  Abdominal:     General: Abdomen is flat. Bowel sounds are normal. There is distension.     Palpations: Abdomen is soft.     Tenderness: There is no abdominal tenderness. There is no guarding or rebound.  Musculoskeletal:        General: Swelling (1+ BL LE swelling up to knee) present. Normal range of motion.     Cervical back: Normal range of motion and neck supple.  Skin:    General: Skin is warm and dry.     Capillary Refill: Capillary refill takes less than 2 seconds.     Coloration: Skin is not jaundiced.  Neurological:     General: No focal deficit present.     Mental Status: He is alert and oriented to person, place, and time.  Psychiatric:        Mood and Affect: Mood normal.        Behavior: Behavior normal.      Assessment & Plan:   See Encounters Tab for problem based charting.  Patient discussed with Dr. Philipp Ovens

## 2020-08-19 LAB — CMP14 + ANION GAP
ALT: 30 IU/L (ref 0–44)
AST: 36 IU/L (ref 0–40)
Albumin/Globulin Ratio: 1.6 (ref 1.2–2.2)
Albumin: 4.2 g/dL (ref 3.8–4.8)
Alkaline Phosphatase: 143 IU/L — ABNORMAL HIGH (ref 44–121)
Anion Gap: 15 mmol/L (ref 10.0–18.0)
BUN/Creatinine Ratio: 20 (ref 10–24)
BUN: 21 mg/dL (ref 8–27)
Bilirubin Total: 0.7 mg/dL (ref 0.0–1.2)
CO2: 22 mmol/L (ref 20–29)
Calcium: 9.4 mg/dL (ref 8.6–10.2)
Chloride: 101 mmol/L (ref 96–106)
Creatinine, Ser: 1.06 mg/dL (ref 0.76–1.27)
GFR calc Af Amer: 85 mL/min/{1.73_m2} (ref >59)
GFR calc non Af Amer: 73 mL/min/{1.73_m2} (ref >59)
Globulin, Total: 2.6 g/dL (ref 1.5–4.5)
Glucose: 311 mg/dL — ABNORMAL HIGH (ref 65–99)
Potassium: 4 mmol/L (ref 3.5–5.2)
Sodium: 138 mmol/L (ref 134–144)
Total Protein: 6.8 g/dL (ref 6.0–8.5)

## 2020-08-19 NOTE — Progress Notes (Signed)
Internal Medicine Clinic Attending ° °Case discussed with Dr. Krienke  At the time of the visit.  We reviewed the resident’s history and exam and pertinent patient test results.  I agree with the assessment, diagnosis, and plan of care documented in the resident’s note.  °

## 2020-08-25 ENCOUNTER — Other Ambulatory Visit: Payer: Self-pay

## 2020-08-25 ENCOUNTER — Ambulatory Visit (HOSPITAL_COMMUNITY)
Admission: RE | Admit: 2020-08-25 | Discharge: 2020-08-25 | Disposition: A | Discharge status: Home / Self Care | Payer: Medicare HMO | Source: Ambulatory Visit | Attending: Gastroenterology | Admitting: Gastroenterology

## 2020-08-25 DIAGNOSIS — I700 Atherosclerosis of aorta: Secondary | ICD-10-CM | POA: Diagnosis not present

## 2020-08-25 DIAGNOSIS — K769 Liver disease, unspecified: Secondary | ICD-10-CM | POA: Insufficient documentation

## 2020-08-25 DIAGNOSIS — Z8505 Personal history of malignant neoplasm of liver: Secondary | ICD-10-CM | POA: Diagnosis not present

## 2020-08-25 DIAGNOSIS — K7460 Unspecified cirrhosis of liver: Secondary | ICD-10-CM | POA: Diagnosis not present

## 2020-08-25 DIAGNOSIS — K7689 Other specified diseases of liver: Secondary | ICD-10-CM | POA: Diagnosis not present

## 2020-08-25 IMAGING — MR MR ABDOMEN WO/W CM
19 of 20 series · 46 of 48 positions shown · IV contrast (gadavist)
Comparison: [DATE]

CLINICAL DATA: History of hepatocellular carcinoma. Follow-up of
liver lesions.

EXAM:
MRI ABDOMEN WITHOUT AND WITH CONTRAST
TECHNIQUE: Multiplanar multisequence MR imaging of the abdomen was performed
both before and after the administration of intravenous contrast.
CONTRAST:  10mL GADAVIST GADOBUTROL 1 MMOL/ML IV SOLN

[Series 2: haste_cor_mbh · coronal · 6.0mm · 1.70mm/px · 1 of 50 slices shown]
[im 1/50]
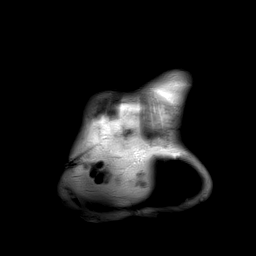

[Series 3: ax_trufi_mbh · axial · 6.0mm · 1.20mm/px · 1 of 52 slices shown]
[im 1/52]
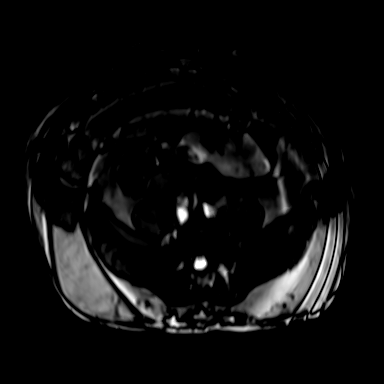

[Series 4: T2 fat-sat · axial · 6.0mm · 1.41mm/px · 1 of 44 slices shown]
[im 1/44]
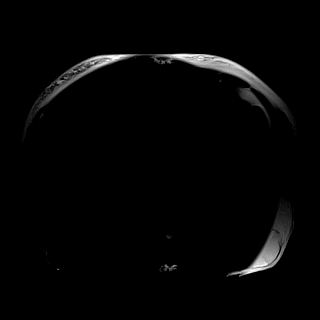

[Series 6: ax_diff_fb_tracew_dfc_mix · axial · 6.0mm · 1.78mm/px · z∈[-267,+122]mm · 3 of 110 slices shown]
[im 1/110]
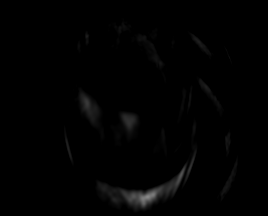
[im 55/110]
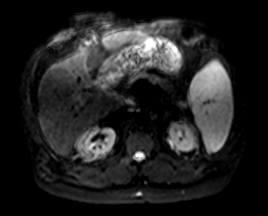
[im 110/110]
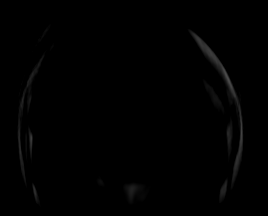

[Series 7: ax_diff_fb_adc_dfc_mix · axial · 6.0mm · 1.78mm/px · 1 of 55 slices shown]
[im 1/55]
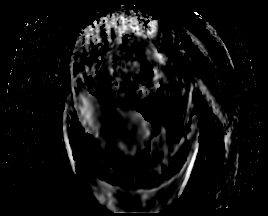

[Series 8: t1_vibe_e-dixon_tra_bh_pre_opp · axial · 3.0mm · 2.53mm/px · z∈[-239,+94]mm · 3 of 112 slices shown]
[im 1/112]
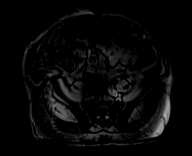
[im 56/112]
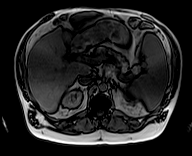
[im 112/112]
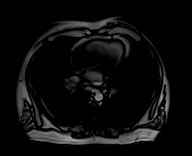

[Series 9: t1_vibe_e-dixon_tra_bh_pre_in · axial · 3.0mm · 2.53mm/px · z∈[-239,+94]mm · 3 of 112 slices shown]
[im 1/112]
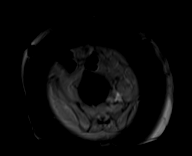
[im 56/112]
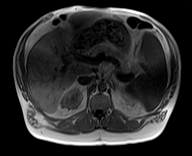
[im 112/112]
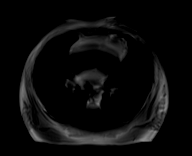

[Series 11: t1_vibe_e-dixon_tra_bh_pre_w · axial · 3.0mm · 2.53mm/px · z∈[-239,+94]mm · 3 of 112 slices shown]
[im 1/112]
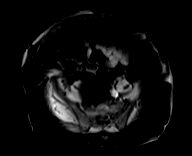
[im 56/112]
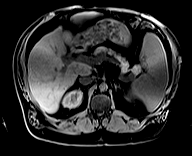
[im 112/112]
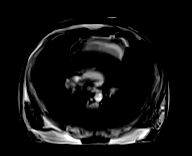

[Series 18: t1_vibe_e-dixon_tra_bh_pre_w_reg · axial · 3.0mm · 2.53mm/px · z∈[-239,+94]mm · 3 of 112 slices shown]
[im 1/112]
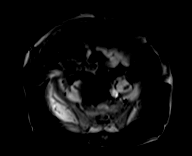
[im 56/112]
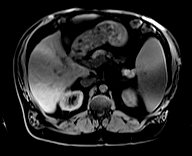
[im 112/112]
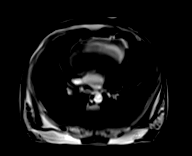

[Series 19: t1_vibe_dixon_tra_bh_arterial_w_reg · axial · 3.0mm · 2.53mm/px · z∈[-239,+94]mm · 3 of 112 slices shown]
[im 1/112]
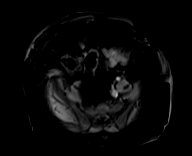
[im 56/112]
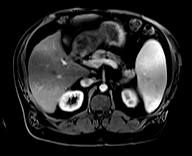
[im 112/112]
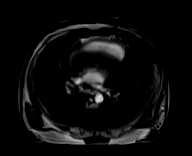

[Series 20: t1_vibe_dixon_tra_bh_arterial_w_sub · axial · 3.0mm · 2.53mm/px · z∈[-239,+94]mm · 3 of 112 slices shown]
[im 1/112]
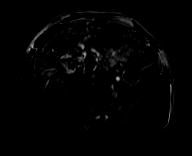
[im 56/112]
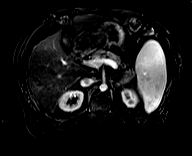
[im 112/112]
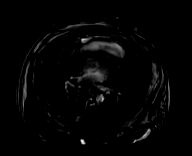

[Series 21: t1_vibe_dixon_tra_bh_venous_w_reg · axial · 3.0mm · 2.53mm/px · z∈[-239,+94]mm · 3 of 112 slices shown]
[im 1/112]
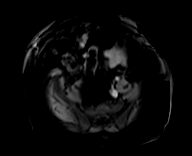
[im 56/112]
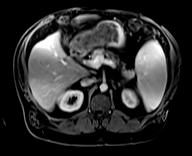
[im 112/112]
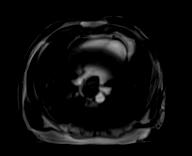

[Series 22: t1_vibe_dixon_tra_bh_venous_w_r_sub · axial · 3.0mm · 2.53mm/px · z∈[-239,+94]mm · 3 of 112 slices shown]
[im 1/112]
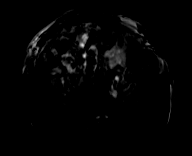
[im 56/112]
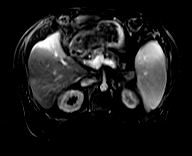
[im 112/112]
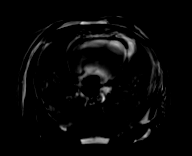

[Series 23: T2 · axial · 6.0mm · 1.84mm/px · 1 of 44 slices shown]
[im 1/44]
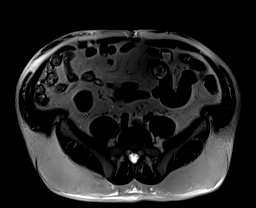

[Series 24: t1_vibe_dixon_tra_bh_delayed_w_reg · axial · 3.0mm · 2.53mm/px · z∈[-239,+94]mm · 3 of 112 slices shown]
[im 1/112]
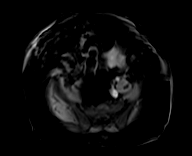
[im 56/112]
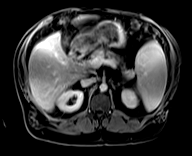
[im 112/112]
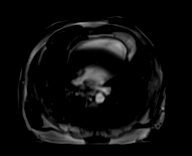

[Series 25: t1_vibe_dixon_tra_bh_delayed_w__sub · axial · 3.0mm · 2.53mm/px · z∈[-239,+94]mm · 3 of 112 slices shown]
[im 1/112]
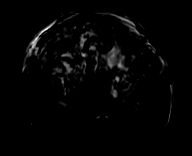
[im 56/112]
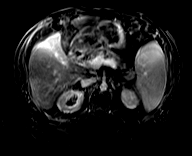
[im 112/112]
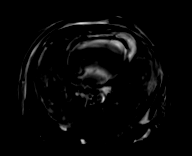

[Series 27: t1_vibe_dixon_cor_bh_post_w · coronal · 5.0mm · 2.51mm/px · 2 of 72 slices shown]
[im 1/72]
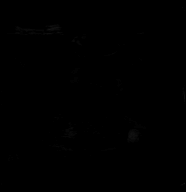
[im 72/72]
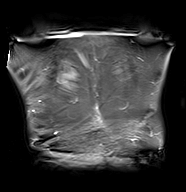

[Series 28: t1_vibe_dixon_tra_bh_3 min_w_reg · axial · 3.0mm · 2.53mm/px · z∈[-239,+94]mm · 3 of 112 slices shown]
[im 1/112]
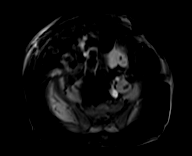
[im 56/112]
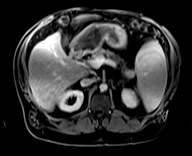
[im 112/112]
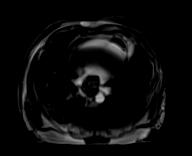

[Series 29: t1_vibe_dixon_tra_bh_3 min_w_re_sub · axial · 3.0mm · 2.53mm/px · z∈[-239,+94]mm · 3 of 112 slices shown]
[im 1/112]
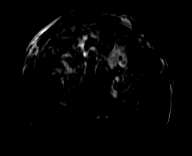
[im 56/112]
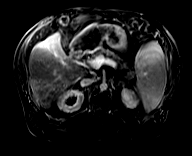
[im 112/112]
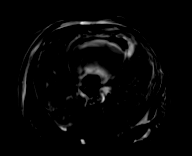

[46 of 48 positions shown; findings below may reference images not displayed]

FINDINGS: Mild to moderate motion degradation throughout.

Lower chest: Normal heart size without pericardial or pleural
effusion. Interstitial thickening at the lung bases is suboptimally
evaluated.

Hepatobiliary: Moderate cirrhosis.

Arterial phase images demonstrate numerous foci of hyperenhancement,
the majority of which demonstrate concurrent mild T2 hyperintensity.
None of these areas demonstrate delayed washout or capsule.

The largest observation measures 3.2 cm on 64/19. Based on lesion
size, this is considered LR 4.

Within the posterior subcapsular right hepatic lobe at 2.2 cm on
33/19. LR 4.

Smaller lesions are identified on series 19, including within the
subcapsular left hepatic lobe at 1.8 cm on 40/19. LR 3.

Within the more posterior left hepatic lobe 1.6 cm on 34/19.  LR 3.

Pancreas:  Normal, without mass or ductal dilatation.

Spleen:  Splenomegaly, 20.7 cm craniocaudal.

Adrenals/Urinary Tract: Normal adrenal glands. Tiny left renal
lesions are likely cysts. Normal right kidney.

Stomach/Bowel: Proximal gastric underdistention. Normal abdominal
bowel loops.

Vascular/Lymphatic: Aortic atherosclerosis. Patent portal and
splenic veins. No abdominal adenopathy.

Other:  No ascites.

Musculoskeletal: No acute osseous abnormality.
IMPRESSION: 1. Mild to moderate motion degradation.
2. Multiple observations with arterial hyperenhancement, vague T2
hyperintensity, but lack of delayed washout or capsule formation.
The larger lesions are considered LR 4 and the smaller lesions are
considered LR 3. Given motion on the current and most recent
abdominal MRI, the patient may benefit from surveillance with CT.
3. Cirrhosis and splenomegaly.
4.  Aortic Atherosclerosis ([DF]-[DF]).

## 2020-08-25 MED ORDER — GADOBUTROL 1 MMOL/ML IV SOLN
10.0000 mL | Freq: Once | INTRAVENOUS | Status: AC | PRN
  Administered 2020-08-25: 10 mL via INTRAVENOUS

## 2020-08-26 ENCOUNTER — Encounter: Payer: Medicare HMO | Attending: Endocrinology | Admitting: Nutrition

## 2020-08-26 ENCOUNTER — Encounter: Payer: Self-pay | Admitting: Endocrinology

## 2020-08-26 ENCOUNTER — Ambulatory Visit (INDEPENDENT_AMBULATORY_CARE_PROVIDER_SITE_OTHER): Payer: Medicare HMO | Admitting: Endocrinology

## 2020-08-26 VITALS — BP 160/62 | HR 80 | Ht 71.0 in | Wt 267.0 lb

## 2020-08-26 DIAGNOSIS — E118 Type 2 diabetes mellitus with unspecified complications: Secondary | ICD-10-CM | POA: Diagnosis not present

## 2020-08-26 LAB — POCT GLYCOSYLATED HEMOGLOBIN (HGB A1C): Hemoglobin A1C: 9.5 % — AB (ref 4.0–5.6)

## 2020-08-26 LAB — POCT GLUCOSE (DEVICE FOR HOME USE): Glucose Fasting, POC: 238 mg/dL — AB (ref 70–99)

## 2020-08-26 MED ORDER — TRESIBA FLEXTOUCH 200 UNIT/ML ~~LOC~~ SOPN: 150.0000 [IU] | PEN_INJECTOR | Freq: Every day | SUBCUTANEOUS | 3 refills | Status: DC

## 2020-08-26 MED ORDER — DEXCOM G6 SENSOR MISC: 1.0000 | 3 refills | Status: AC

## 2020-08-26 MED ORDER — FIASP 100 UNIT/ML ~~LOC~~ SOLN: 60.0000 [IU] | Freq: Three times a day (TID) | SUBCUTANEOUS | 3 refills | Status: AC

## 2020-08-26 NOTE — Patient Instructions (Addendum)
Please continue the same insulins. Blood tests are requested for you today.  We'll let you know about the results.  I have sent a prescription to your pharmacy, for the continuous glucose monitor.  Please come back for a follow-up appointment in 6 weeks.

## 2020-08-26 NOTE — Patient Instructions (Signed)
Continue to drink 0 calorie beverages Always eat after within 10 min. After taking Fiasp. Try to eat 2-4 servings of fruits or veg. Every day.

## 2020-08-26 NOTE — Progress Notes (Signed)
Patient is here today to see Dr. Loanne Drilling and to discuss how he can get a CGM and insulin pump.  He reports taking his insulin injections as directed every day:  Tresiba: 150u qHS         Fiasp: 60u ac all meals  Discussed Dexcom CGM and his Medicaid will pay for this.  Dr. Loanne Drilling will write a scipt and he was told to call me when it comes in for training.  His phone is able to be the receiver.  The G6 app was downloaded for this.   CGM:  Pt. Did not bring meter.  Says readings are between 111-low 200s.   Diet:  9:30 Bfast:  2 pieces of light bread toasted with sugar free syrup and coke 0.    2PM: lunch: [place] or bologna sandwich with mayo, baked chips and coke or pepsie 0.   2PM: snack of chips 1-3 X/wk 10PM: chicken sandwich with baked chips and unsweet tea  Exercise: none. Plan:  1. Discussed the need for fruit or veg. 1-4 servings/day.  Suggestions given for lower cost veg./fruits and the need for this. 2.  Discussed the cost of an insulin pump and how, if ever medicaid will pay for this.  C-peptide and FBS done today. 3.  Pt.'s blood sugar was 238 2hr. pc 60u of Fiasp, without eating.  Discussed importance of eating after taking Fiasp, to prevent low blood sugars.  He reported good understanding of this.

## 2020-08-26 NOTE — Progress Notes (Signed)
Subjective:    Patient ID: Nicholas Rice, male    DOB: Jun 19, 1956, 64 y.o.   MRN: [id number]  HPI  Pt returns for f/u of diabetes mellitus: DM type: Insulin-requiring type 2 Dx'ed: 4782 Complications: stage 3 CRI, PN, and CAD Therapy: insulin since 1996 DKA: never Severe hypoglycemia: once (2017) Pancreatitis: approx 2000 (he says cause was unknown Pancreatic imaging: normal on 2021 CT SDOH: pt is reported to have food insecurity and little income for copays.   Other: He eats meals at 9AM, 2PM, and 8PM (but he sometimes eats just twice per day) Interval history: He reports weight gain.  Last week, tresiba was increased to 150 units qd.  He seldom checks cbg.  He says it varies widely.  Pt says he rarely misses insulin.  Pt says he cannot afford insulin, and does not qualify for pt assist, due to medicaid.  He ran out of sensors.   Past Medical History:  Diagnosis Date  . Anemia   . Benign prostatic hyperplasia   . CAD in native artery   . Cancer ([place])    basal cell  . Depression   . Diabetic neuropathy ([place])   . Diastolic heart failure ([place])   . Edema   . Enlarged prostate   . GERD (gastroesophageal reflux disease)   . Gout   . Hyperlipidemia   . Hypertension   . Hypogonadism in male   . Muscle cramp   . Osteoarthritis   . Pancytopenia ([place]) 01/02/2015  . Peripheral neuropathy   . 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 Renal insufficiency   . Stage 3 chronic kidney disease ([place])   . Type 2 diabetes with complication [facility])     Past Surgical History:  Procedure Laterality Date  . CATARACT EXTRACTION Bilateral   . CHOLECYSTECTOMY  1981  . [place]  . TONSILLECTOMY  1963    Social History   Socioeconomic History  . Marital status: Single    Spouse name: Not on file  . Number of children: 2  . Years of education: Not on file  . Highest education level: 10th grade  Occupational History  . Not on file  Tobacco Use  . Smoking  status: Former Research scientist (life sciences)  . Smokeless tobacco: Never Used  Vaping Use  . Vaping Use: Never used  Substance and Sexual Activity  . Alcohol use: Never  . Drug use: Never  . Sexual activity: Not on file  Other Topics Concern  . Not on file  Social History Narrative   07/14/20 Lives alone   Caffeine- sodas 3 daily   Social Determinants of Health   Financial Resource Strain:   . Difficulty of Paying Living Expenses: Not on file  Food Insecurity:   . Worried About Charity fundraiser in the Last Year: Not on file  . Ran Out of Food in the Last Year: Not on file  Transportation Needs:   . Lack of Transportation (Medical): Not on file  . Lack of Transportation (Non-Medical): Not on file  Physical Activity:   . Days of Exercise per Week: Not on file  . Minutes of Exercise per Session: Not on file  Stress:   . Feeling of Stress : Not on file  Social Connections:   . Frequency of Communication with Friends and Family: Not on file  . Frequency of Social Gatherings with Friends and Family: Not on file  . Attends Religious Services: Not on file  .  Active Member of Clubs or Organizations: Not on file  . Attends Archivist Meetings: Not on file  . Marital Status: Not on file  Intimate Partner Violence:   . Fear of Current or Ex-Partner: Not on file  . Emotionally Abused: Not on file  . Physically Abused: Not on file  . Sexually Abused: Not on file    Current Outpatient Medications on File Prior to Visit  Medication Sig Dispense Refill  . albuterol (PROVENTIL) (2.5 MG/3ML) 0.083% nebulizer solution Take 3 mLs (2.5 mg total) by nebulization every 4 (four) hours as needed for wheezing or shortness of breath. 75 mL 2  . APPLE CIDER VINEGAR PO Take 1 tablet by mouth daily.     Marland Kitchen atorvastatin (LIPITOR) 40 MG tablet Take 40 mg by mouth daily.    Marland Kitchen b complex vitamins capsule Take 1 capsule by mouth daily.    . Blood Glucose Monitoring Suppl (ONETOUCH VERIO) w/Device KIT Check blood  sugar 4 times daily 1 kit 0  . cholestyramine (QUESTRAN) 4 g packet Take 1 packet (4 g total) by mouth 3 (three) times daily with meals. 60 each 12  . colchicine 0.6 MG tablet Take 1 tablet (0.6 mg total) by mouth 2 (two) times daily. 60 tablet 5  . diclofenac Sodium (VOLTAREN) 1 % GEL Apply 2 g topically 4 (four) times daily. 150 g 2  . furosemide (LASIX) 40 MG tablet Take 1 tablet (40 mg total) by mouth daily. 30 tablet 5  . gabapentin (NEURONTIN) 600 MG tablet Take 1 tablet (600 mg total) by mouth at bedtime. 30 tablet 5  . glucose blood (ONETOUCH VERIO) test strip Check blood sugar 3 times a day as instructed 270 each 3  . Insulin Pen Needle 32G X 4 MM MISC Use to inject insulin 4 times a day 400 each 3  . Insulin Syringe-Needle U-100 31G X 15/64" 1 ML MISC Use to inject insulin 1 time a day 100 each 3  . Lancets (ONETOUCH DELICA PLUS VHQION62X) MISC Check blood sugar 3 times per day. 270 each 3  . losartan (COZAAR) 25 MG tablet Take 1 tablet (25 mg total) by mouth daily. 30 tablet 2  . magnesium oxide (MAG-OX) 400 MG tablet Take 400 mg by mouth daily.     . metoprolol succinate (TOPROL XL) 25 MG 24 hr tablet Take 0.5 tablets (12.5 mg total) by mouth daily. 15 tablet 2  . Misc Natural Products (JOINT HEALTH PO) Take 1 tablet by mouth daily.     . Na Sulfate-K Sulfate-Mg Sulf (SUPREP BOWEL PREP KIT) 17.5-3.13-1.6 GM/177ML SOLN Take 1 kit by mouth as directed. For colonoscopy prep 354 mL 0  . pantoprazole (PROTONIX) 40 MG tablet Take 40 mg by mouth daily.    . sertraline (ZOLOFT) 100 MG tablet Take 200 mg by mouth daily.     Marland Kitchen spironolactone (ALDACTONE) 100 MG tablet Take 1 tablet (100 mg total) by mouth daily. 30 tablet 5  . tamsulosin (FLOMAX) 0.4 MG CAPS capsule Take 0.4 mg by mouth daily.    . traMADol (ULTRAM) 50 MG tablet Take 50 mg by mouth 4 (four) times daily as needed for moderate pain or severe pain.      No current facility-administered medications on file prior to visit.     Allergies  Allergen Reactions  . Loratadine Rash    Family History  Problem Relation Age of Onset  . Diabetes Mother   . Emphysema Mother   . Congestive  Heart Failure Mother   . Breast cancer Mother   . Diabetes Father   . Heart disease Father   . Heart attack Father   . Colon cancer Neg Hx   . Stomach cancer Neg Hx   . Pancreatic cancer Neg Hx   . Esophageal cancer Neg Hx   . Inflammatory bowel disease Neg Hx   . Liver disease Neg Hx   . Rectal cancer Neg Hx     BP (!) 160/62   Pulse 80   Ht '5\' 11"'  (1.803 m)   Wt 267 lb (121.1 kg)   SpO2 97%   BMI 37.24 kg/m    Review of Systems He denies hypoglycemia    Objective:   Physical Exam VITAL SIGNS:  See vs page GENERAL: no distress Pulses: dorsalis pedis intact bilat.   MSK: no deformity of the feet CV: 2+ bilat leg edema Skin:  no ulcer on the feet.  normal color and temp on the feet. Neuro: sensation is intact to touch on the feet Ext: there is bilateral onychomycosis of the toenails.    Lab Results  Component Value Date   HGBA1C 9.5 (A) 08/26/2020       Assessment & Plan:  Insulin-requiring type 2 DM, with CRI: uncontrolled.  We need cbg or continuous glucose monitor data, in order to safely increase insulin  Patient Instructions  Please continue the same insulins. Blood tests are requested for you today.  We'll let you know about the results.  I have sent a prescription to your pharmacy, for the continuous glucose monitor.  Please come back for a follow-up appointment in 6 weeks.

## 2020-08-27 ENCOUNTER — Telehealth: Payer: Self-pay | Admitting: Gastroenterology

## 2020-08-27 ENCOUNTER — Telehealth: Payer: Self-pay | Admitting: *Deleted

## 2020-08-27 LAB — C-PEPTIDE: C-Peptide: 1.08 ng/mL (ref 0.80–3.85)

## 2020-08-27 LAB — GLUCOSE, FASTING: Glucose, Bld: 254 mg/dL — ABNORMAL HIGH (ref 65–99)

## 2020-08-27 NOTE — Telephone Encounter (Signed)
t calls for results from [facility] 08/25/2020 ordered by dr Rush Landmark. He is ask to call office of dr Rush Landmark for the results, he is agreeable

## 2020-08-27 NOTE — Telephone Encounter (Signed)
Pt is requesting a call back from a nurse to discuss his MRI results.

## 2020-08-27 NOTE — Telephone Encounter (Signed)
Dr Rush Landmark I see where you sent a message to [facility] but the pt is calling for results.  Are you calling the pt?

## 2020-08-27 NOTE — Telephone Encounter (Signed)
Patient called again inquiring about imaging results.

## 2020-08-27 NOTE — Telephone Encounter (Signed)
Pt aware we are waiting for Dr Rush Landmark to review the results

## 2020-08-28 ENCOUNTER — Telehealth: Payer: Self-pay

## 2020-08-28 NOTE — Telephone Encounter (Signed)
I called the patient and cannot leave a message. I will try to call back later today or tomorrow to update him on the results. Thank you. GM

## 2020-08-28 NOTE — Telephone Encounter (Signed)
Very busy hospital week. I will try to call him by the end of today. His case is going to be discussed at multidisciplinary conference in 2 weeks (the next available date). Thanks. GM

## 2020-08-28 NOTE — Telephone Encounter (Signed)
Called to schedule sleep study - mailbox full

## 2020-08-29 NOTE — Telephone Encounter (Signed)
Unsuccessful again at reaching the patient and could not leave a voicemail. I will try to call him over the weekend if not it will be on Tuesday when I return to the office.

## 2020-08-31 NOTE — Telephone Encounter (Signed)
Was finally able to connect with the patient today 08/31/2020 1 in the PM. We went over the results of the MRI abdomen. Multiple liver lesions that are LiRads 3 and LiRads 4. I will be discussing his case at [facility] on 12/15 (he is already on the schedule for this date). I would like to go ahead and get the patient on the interventional radiology schedule for the week of the 20th or the 27th to try and do a ultrasound-guided biopsy of the LiRads 4 lesions such that if [facility] feels that a biopsy is necessary (as I expect) then we will have that on the books. Patient understands that sometimes liver cancers (as my highest concern is) can be treated with resection or local therapies or oral chemotherapies but we will not know until we get confirmation that this is a cancer. If after [place] discussion they do not feel that a liver biopsy is required based on review then we will cancel the liver biopsy. Patient appreciative for the call back and discussion. He related about how he is late wife passed away from metastatic pancreas cancer to the liver and he does not want to waste time.  Patty, please schedule the liver biopsy.   Justice Britain, MD [facility] Office # [phone]

## 2020-09-01 ENCOUNTER — Other Ambulatory Visit: Payer: Self-pay

## 2020-09-01 DIAGNOSIS — K769 Liver disease, unspecified: Secondary | ICD-10-CM

## 2020-09-01 NOTE — Telephone Encounter (Signed)
Order placed in epic for liver biopsy. Requested week of 12/20 or 12/27. Once radiology reviews the records they will contact pt with the appt.

## 2020-09-03 ENCOUNTER — Telehealth: Payer: Self-pay

## 2020-09-03 ENCOUNTER — Encounter (HOSPITAL_COMMUNITY): Payer: Self-pay

## 2020-09-03 NOTE — Telephone Encounter (Signed)
Called pt twice now. He is out of network with [place].   We have attempted to call the patient two times to.  Patient has been unavailable at the phone numbers we have on file and has not returned our calls.  If patient calls back we will let him know that we contacted his insurance and he is out of network with [place].

## 2020-09-03 NOTE — Progress Notes (Unsigned)
Nicholas Rice Male, 64 y.o., Mar 04, 1956 MRN:  [id number] Phone:  [phone] [name]) PCP:  Marianna Payment, MD Primary Cvg:  [place] Medicare/[employer] Medicare Hmo/Ppo Next Appt With Radiology ([facility]-US 2) 09/15/2020 at 1:00 PM  RE: Biopsy Received: 2 days ago Message Details  Arne Cleveland, MD  Ernestene Mention   [place] core liver lesion segment 5/6  See MRI Se19 Im64  If not visible consider CT   [facility]   Previous Messages  ----- Message -----  From: Lenore Cordia  Sent: 09/01/2020  3:12 PM EST  To: Ir Procedure Requests  Subject: Biopsy                      Procedure Requested: US Biopsy    Reason for Procedure: Liver lesions    Provider Requesting: Irving Copas.  Provider Telephone: [phone]    Other Info:

## 2020-09-04 ENCOUNTER — Telehealth: Payer: Self-pay | Admitting: Endocrinology

## 2020-09-04 DIAGNOSIS — E118 Type 2 diabetes mellitus with unspecified complications: Secondary | ICD-10-CM

## 2020-09-04 NOTE — Telephone Encounter (Signed)
Nicholas Rice called from [name] regarding messages below

## 2020-09-04 NOTE — Telephone Encounter (Signed)
Patient called stating he has his insulin but does not have tresiba and he doesn't have meter sensors. Patient states he is supposed to be getting a dexcon. Please advise [phone]

## 2020-09-05 MED ORDER — TRESIBA FLEXTOUCH 200 UNIT/ML ~~LOC~~ SOPN: 150.0000 [IU] | PEN_INJECTOR | Freq: Every day | SUBCUTANEOUS | 3 refills | Status: AC

## 2020-09-05 MED ORDER — FREESTYLE LIBRE 14 DAY SENSOR MISC: 2 refills | Status: AC

## 2020-09-05 NOTE — Addendum Note (Signed)
Addended by: Jefferson Fuel on: 09/05/2020 02:25 PM   Modules accepted: Orders

## 2020-09-08 DIAGNOSIS — R531 Weakness: Secondary | ICD-10-CM | POA: Diagnosis not present

## 2020-09-10 NOTE — Addendum Note (Signed)
Addended by: Hulan Fray on: 09/10/2020 06:42 AM   Modules accepted: Orders

## 2020-09-11 ENCOUNTER — Other Ambulatory Visit: Payer: Self-pay | Admitting: Radiology

## 2020-09-11 DIAGNOSIS — R531 Weakness: Secondary | ICD-10-CM | POA: Diagnosis not present

## 2020-09-15 ENCOUNTER — Other Ambulatory Visit (HOSPITAL_COMMUNITY): Payer: Self-pay | Admitting: Gastroenterology

## 2020-09-15 ENCOUNTER — Telehealth (HOSPITAL_COMMUNITY): Payer: Self-pay | Admitting: *Deleted

## 2020-09-15 ENCOUNTER — Other Ambulatory Visit: Payer: Self-pay | Admitting: Radiology

## 2020-09-15 ENCOUNTER — Ambulatory Visit (HOSPITAL_COMMUNITY)
Admission: RE | Admit: 2020-09-15 | Discharge: 2020-09-15 | Disposition: A | Discharge status: Home / Self Care | Payer: Medicare HMO | Source: Ambulatory Visit | Attending: Gastroenterology | Admitting: Gastroenterology

## 2020-09-15 ENCOUNTER — Encounter (HOSPITAL_COMMUNITY): Payer: Self-pay

## 2020-09-15 ENCOUNTER — Other Ambulatory Visit: Payer: Self-pay | Admitting: Gastroenterology

## 2020-09-15 ENCOUNTER — Other Ambulatory Visit: Payer: Self-pay

## 2020-09-15 DIAGNOSIS — R160 Hepatomegaly, not elsewhere classified: Secondary | ICD-10-CM

## 2020-09-15 DIAGNOSIS — K7689 Other specified diseases of liver: Secondary | ICD-10-CM | POA: Diagnosis not present

## 2020-09-15 DIAGNOSIS — K769 Liver disease, unspecified: Secondary | ICD-10-CM

## 2020-09-15 DIAGNOSIS — K7460 Unspecified cirrhosis of liver: Secondary | ICD-10-CM | POA: Diagnosis not present

## 2020-09-15 LAB — GLUCOSE, CAPILLARY: Glucose-Capillary: 83 mg/dL (ref 70–99)

## 2020-09-15 LAB — CBC
HCT: 35.4 % — ABNORMAL LOW (ref 39.0–52.0)
Hemoglobin: 11.6 g/dL — ABNORMAL LOW (ref 13.0–17.0)
MCH: 30.1 pg (ref 26.0–34.0)
MCHC: 32.8 g/dL (ref 30.0–36.0)
MCV: 91.9 fL (ref 80.0–100.0)
Platelets: 69 10*3/uL — ABNORMAL LOW (ref 150–400)
RBC: 3.85 MIL/uL — ABNORMAL LOW (ref 4.22–5.81)
RDW: 15.2 % (ref 11.5–15.5)
WBC: 3.7 10*3/uL — ABNORMAL LOW (ref 4.0–10.5)
nRBC: 0 % (ref 0.0–0.2)

## 2020-09-15 LAB — PROTIME-INR
INR: 1.1 (ref 0.8–1.2)
Prothrombin Time: 13.8 seconds (ref 11.4–15.2)

## 2020-09-15 IMAGING — US IR ABDOMEN US LIMITED
2 series · 6 of 6 positions shown · non-contrast
Comparison: [DATE]

CLINICAL DATA: Indeterminate liver lesions poorly demonstrated by
CT but present by MRI. Assess for biopsy

EXAM:
ULTRASOUND ABDOMEN LIMITED

[Series 1: ir abdomen us limited · 4 of 4 slices shown (1 of 2)]
[im 1/4]
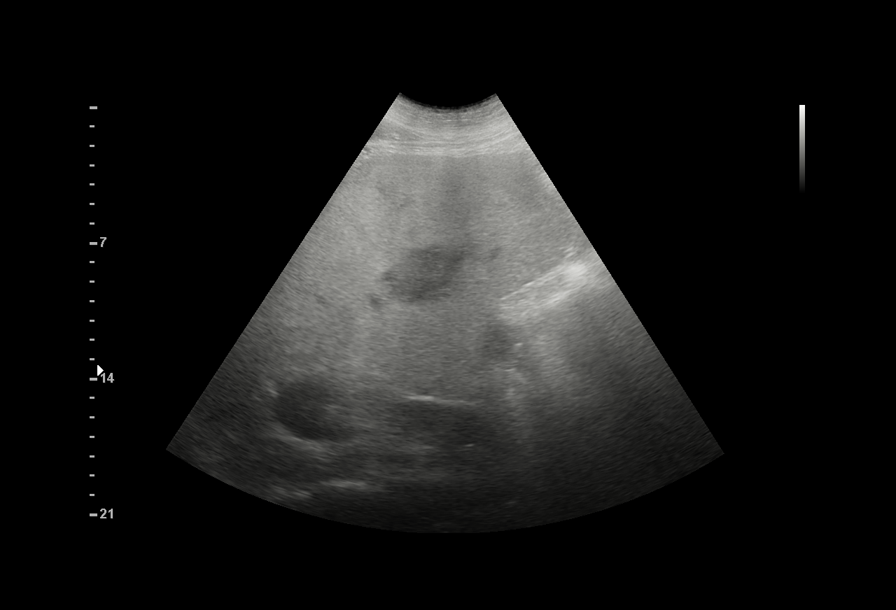
[im 2/4]
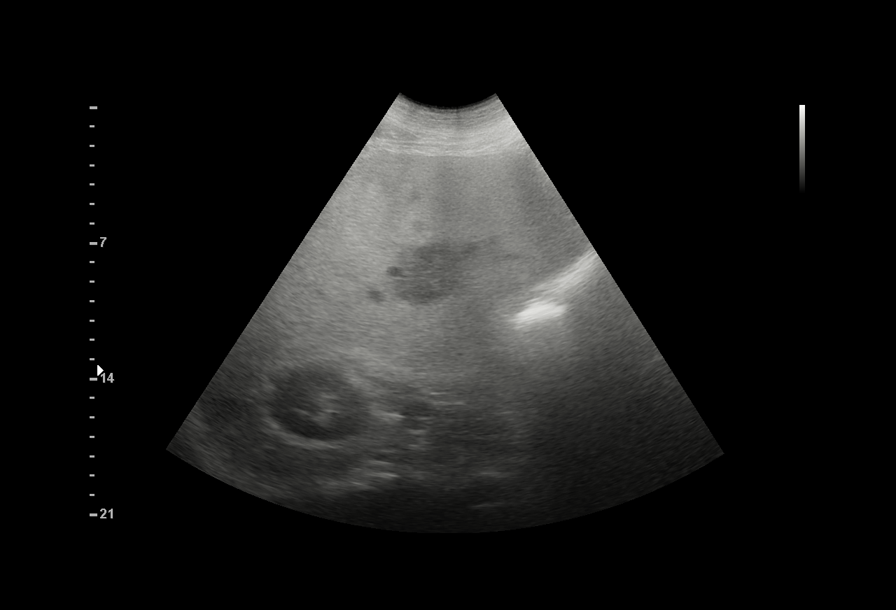
[im 3/4]
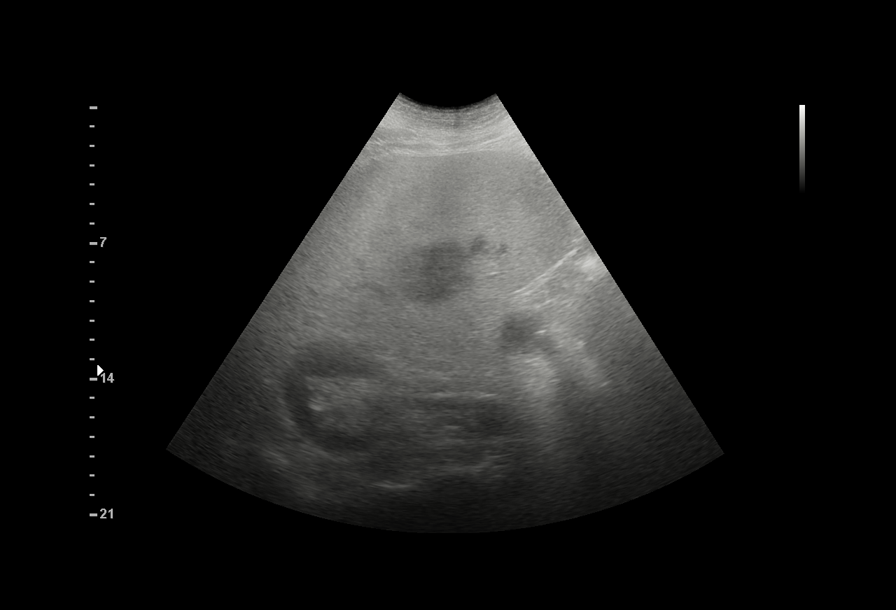
[im 4/4]
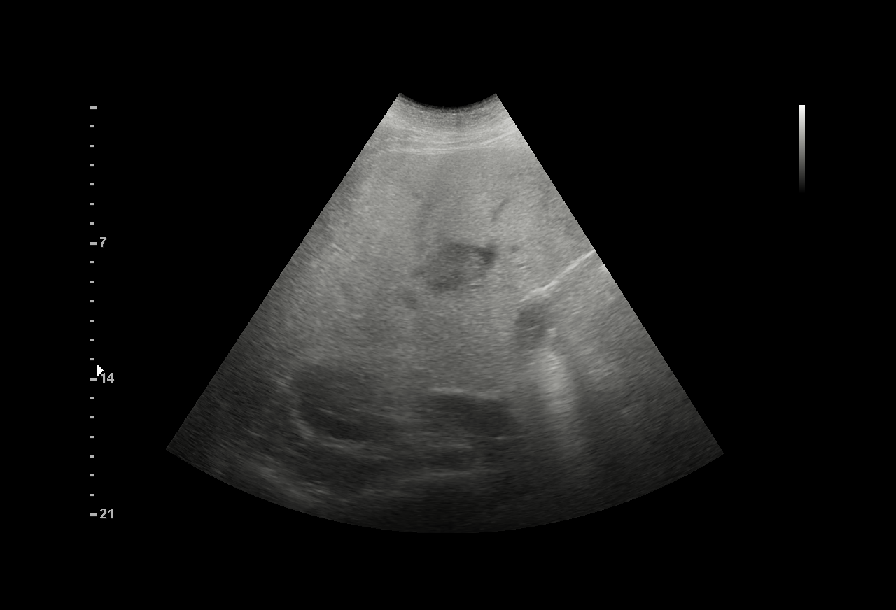

[Series 4: ir abdomen us limited · 2 of 2 slices shown (2 of 2)]
[im 1/2]
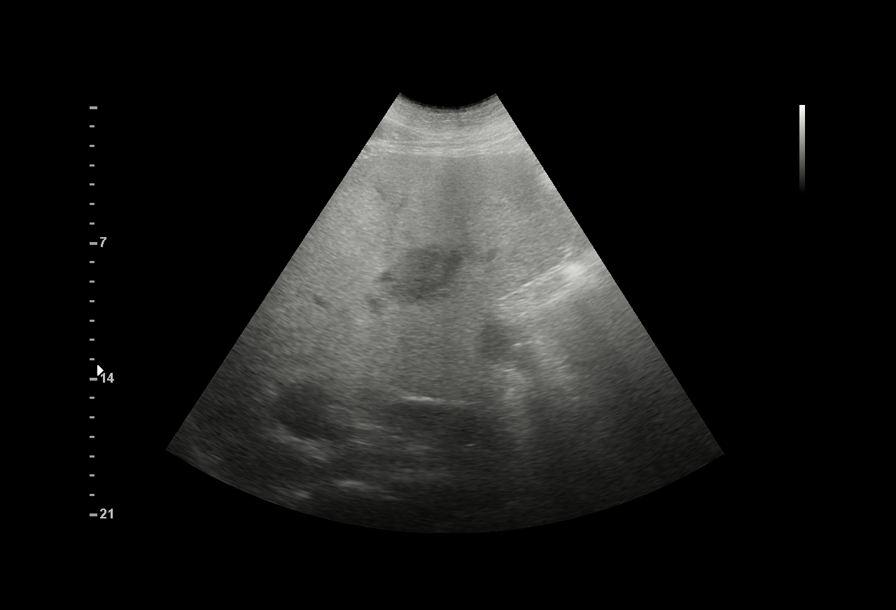
[im 2/2]
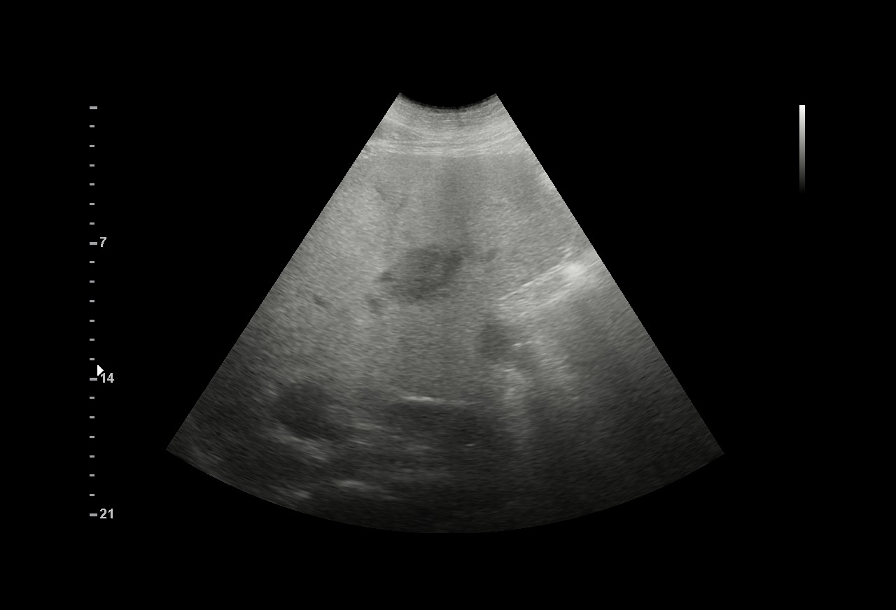

[6 of 6 positions shown; findings below may reference images not displayed]

FINDINGS: Limited ultrasound performed of the liver to identified the central
right hepatic lesion, segment [DATE]. Ultrasound of this area confirms
a solid hypoechoic lesion centrally in the right hepatic lobe
inferior to the portal veins which correlates with the MRI finding.
Lesion is amenable to ultrasound biopsy.
IMPRESSION: Central right hepatic lesion is visualized by ultrasound to perform
biopsy.

PLAN:
Patient has cirrhosis and thrombocytopenia. Ultrasound core biopsy
will be coordinated for the patient to receive platelets on arrival.
This has been rescheduled for [DATE] at was [REDACTED].

## 2020-09-15 MED ORDER — MIDAZOLAM HCL 2 MG/2ML IJ SOLN
INTRAMUSCULAR | Status: AC
  Filled 2020-09-15: qty 2

## 2020-09-15 MED ORDER — FENTANYL CITRATE (PF) 100 MCG/2ML IJ SOLN
INTRAMUSCULAR | Status: AC
  Filled 2020-09-15: qty 2

## 2020-09-15 MED ORDER — SODIUM CHLORIDE 0.9 % IV SOLN: INTRAVENOUS | Status: DC

## 2020-09-15 NOTE — Progress Notes (Signed)
Patient presented to [facility]iology for scheduled liver biopsy.  CBC resulted with platelet count of 69. Dr. Annamaria Boots notified of platelet result.  Dr. Annamaria Boots cancelled liver biopsy scheduled for today 09/15/2020, to be rescheduled with platelet administration.

## 2020-09-15 NOTE — H&P (Signed)
Chief Complaint: Patient was seen in consultation today for liver lesion biopsy at the request of Mansouraty,Gabriel Jr.  Referring Physician(s): Mansouraty,Gabriel Jr.  Supervising Physician: Daryll Brod  Patient Status: [facility] - Out-pt  History of Present Illness: Nicholas Rice is a 64 y.o. male   Liver lesion Pt was seen in hospital Sept 2021-- PNA While in house work up was ordered and Hepatic cirrhosis was diagnosed per imaging;  also noted liver lesions  Further work up ensued MRI 11/29: IMPRESSION: 1. Mild to moderate motion degradation. 2. Multiple observations with arterial hyperenhancement, vague T2 hyperintensity, but lack of delayed washout or capsule formation. The larger lesions are considered LR 4 and the smaller lesions are considered LR 3. Given motion on the current and most recent abdominal MRI, the patient may benefit from surveillance with CT. 3. Cirrhosis and splenomegaly.  Referred to Dr Early Osmond Scheduled now for liver lesion biopsy  Does complain of new recent hematuria and melana  Past Medical History:  Diagnosis Date  . Anemia   . Benign prostatic hyperplasia   . CAD in native artery   . Cancer ([place])    basal cell  . Depression   . Diabetic neuropathy ([place])   . Diastolic heart failure ([place])   . Edema   . Enlarged prostate   . GERD (gastroesophageal reflux disease)   . Gout   . Hyperlipidemia   . Hypertension   . Hypogonadism in male   . Muscle cramp   . Osteoarthritis   . Pancytopenia ([place]) 01/02/2015  . Peripheral neuropathy   . 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 Renal insufficiency   . Stage 3 chronic kidney disease ([place])   . Type 2 diabetes with complication [facility])     Past Surgical History:  Procedure Laterality Date  . CATARACT EXTRACTION Bilateral   . CHOLECYSTECTOMY  1981  . [place]  . TONSILLECTOMY  1963    Allergies: Loratadine  Medications: Prior to Admission  medications   Medication Sig Start Date End Date Taking? Authorizing Provider  albuterol (PROVENTIL) (2.5 MG/3ML) 0.083% nebulizer solution Take 3 mLs (2.5 mg total) by nebulization every 4 (four) hours as needed for wheezing or shortness of breath. 06/10/20 06/10/21 Yes Iona Lea, MD  atorvastatin (LIPITOR) 40 MG tablet Take 40 mg by mouth daily.   Yes [provider]  b complex vitamins capsule Take 1 capsule by mouth daily.   Yes [provider]  cholestyramine (QUESTRAN) 4 g packet Take 1 packet (4 g total) by mouth 3 (three) times daily with meals. 08/01/20  Yes Mansouraty, Telford Nab., MD  colchicine 0.6 MG tablet Take 1 tablet (0.6 mg total) by mouth 2 (two) times daily. 06/23/20 12/20/20 Yes Seawell, Jaimie A, DO  diclofenac Sodium (VOLTAREN) 1 % GEL Apply 2 g topically 4 (four) times daily. Patient taking differently: Apply 2 g topically daily as needed (Knee pain). 08/01/20  Yes Asencion Noble, MD  ferrous sulfate 325 (65 FE) MG tablet Take 650 mg by mouth daily with breakfast.   Yes [provider]  furosemide (LASIX) 40 MG tablet Take 1 tablet (40 mg total) by mouth daily. 08/18/20  Yes Asencion Noble, MD  gabapentin (NEURONTIN) 600 MG tablet Take 1 tablet (600 mg total) by mouth at bedtime. 08/01/20  Yes Asencion Noble, MD  Insulin Aspart, w/Niacinamide, (FIASP) 100 UNIT/ML SOLN Inject 60 Units into the skin 3 (three) times daily before meals.  Patient taking differently: Inject 50 Units into the skin 3 (three) times daily before meals. 08/26/20  Yes Renato Shin, MD  insulin NPH Human (NOVOLIN N) 100 UNIT/ML injection Inject 150 Units into the skin at bedtime.   Yes [provider]  losartan (COZAAR) 25 MG tablet Take 1 tablet (25 mg total) by mouth daily. 07/14/20 10/12/20 Yes Marianna Payment, MD  MAGNESIUM OXIDE PO Take 400 mg by mouth daily. 100 mg each   Yes [provider]  metoprolol succinate (TOPROL XL) 25 MG 24 hr tablet Take  0.5 tablets (12.5 mg total) by mouth daily. 07/14/20 10/12/20 Yes Marianna Payment, MD  pantoprazole (PROTONIX) 40 MG tablet Take 40 mg by mouth daily. 09/27/18  Yes [provider]  sertraline (ZOLOFT) 100 MG tablet Take 200 mg by mouth daily.  02/08/20  Yes [provider]  spironolactone (ALDACTONE) 100 MG tablet Take 1 tablet (100 mg total) by mouth daily. 08/18/20 08/18/21 Yes Asencion Noble, MD  tamsulosin (FLOMAX) 0.4 MG CAPS capsule Take 0.4 mg by mouth daily. 09/27/18  Yes [provider]  traMADol (ULTRAM) 50 MG tablet Take 50 mg by mouth 4 (four) times daily as needed for moderate pain or severe pain.  09/09/18  Yes [provider]  Blood Glucose Monitoring Suppl (ONETOUCH VERIO) w/Device KIT Check blood sugar 4 times daily 07/21/20   Marianna Payment, MD  Continuous Blood Gluc Sensor (DEXCOM G6 SENSOR) MISC 1 Device by Does not apply route See admin instructions. Change every 10 days 08/26/20   Renato Shin, MD  Continuous Blood Gluc Sensor (FREESTYLE LIBRE 14 DAY SENSOR) MISC Change sensor every 14 days 09/05/20   Renato Shin, MD  glucose blood [facility] VERIO) test strip Check blood sugar 3 times a day as instructed 07/28/20   Marianna Payment, MD  insulin degludec (TRESIBA FLEXTOUCH) 200 UNIT/ML FlexTouch Pen Inject 150 Units into the skin daily. 09/05/20   Renato Shin, MD  Insulin Pen Needle 32G X 4 MM MISC Use to inject insulin 4 times a day 07/14/20   Lacinda Axon, MD  Insulin Syringe-Needle U-100 31G X 15/64" 1 ML MISC Use to inject insulin 1 time a day 07/14/20   Lacinda Axon, MD  Lancets [facility] DELICA PLUS [id number]) MISC Check blood sugar 3 times per day. 07/28/20   Marianna Payment, MD  Na Sulfate-K Sulfate-Mg Sulf (SUPREP BOWEL PREP KIT) 17.5-3.13-1.6 GM/177ML SOLN Take 1 kit by mouth as directed. For colonoscopy prep 08/01/20   Mansouraty, Telford Nab., MD     Family History  Problem Relation Age of Onset  . Diabetes Mother   .  Emphysema Mother   . Congestive Heart Failure Mother   . Breast cancer Mother   . Diabetes Father   . Heart disease Father   . Heart attack Father   . Colon cancer Neg Hx   . Stomach cancer Neg Hx   . Pancreatic cancer Neg Hx   . Esophageal cancer Neg Hx   . Inflammatory bowel disease Neg Hx   . Liver disease Neg Hx   . Rectal cancer Neg Hx     Social History   Socioeconomic History  . Marital status: Single    Spouse name: Not on file  . Number of children: 2  . Years of education: Not on file  . Highest education level: 10th grade  Occupational History  . Not on file  Tobacco Use  . Smoking status: Former Research scientist (life sciences)  . Smokeless tobacco: Never  Used  Vaping Use  . Vaping Use: Never used  Substance and Sexual Activity  . Alcohol use: Never  . Drug use: Never  . Sexual activity: Not on file  Other Topics Concern  . Not on file  Social History Narrative   07/14/20 Lives alone   Caffeine- sodas 3 daily   Social Determinants of Health   Financial Resource Strain: Not on file  Food Insecurity: Not on file  Transportation Needs: Not on file  Physical Activity: Not on file  Stress: Not on file  Social Connections: Not on file    Review of Systems: A 12 point ROS discussed and pertinent positives are indicated in the HPI above.  All other systems are negative.  Review of Systems  Constitutional: Positive for activity change, appetite change and fatigue. Negative for fever.  Respiratory: Negative for cough and shortness of breath.   Cardiovascular: Negative for chest pain.  Gastrointestinal: Positive for abdominal distention and blood in stool. Negative for nausea.  Genitourinary: Positive for hematuria.  Musculoskeletal: Negative for gait problem.  Psychiatric/Behavioral: Negative for behavioral problems and confusion.    Vital Signs: There were no vitals taken for this visit.  Physical Exam Vitals reviewed.  HENT:     Mouth/Throat:     Mouth: Mucous membranes  are moist.  Cardiovascular:     Rate and Rhythm: Normal rate and regular rhythm.     Heart sounds: Normal heart sounds.  Pulmonary:     Effort: Pulmonary effort is normal.     Breath sounds: Normal breath sounds.  Abdominal:     General: There is distension.     Palpations: Abdomen is soft.     Tenderness: There is no abdominal tenderness.  Musculoskeletal:        General: Normal range of motion.  Skin:    General: Skin is warm.     Coloration: Skin is not jaundiced.  Neurological:     Mental Status: He is alert and oriented to person, place, and time.  Psychiatric:        Behavior: Behavior normal.     Imaging: MR Abdomen W Wo Contrast  Result Date: 08/25/2020 CLINICAL DATA:  History of hepatocellular carcinoma. Follow-up of liver lesions. EXAM: MRI ABDOMEN WITHOUT AND WITH CONTRAST TECHNIQUE: Multiplanar multisequence MR imaging of the abdomen was performed both before and after the administration of intravenous contrast. CONTRAST:  35m GADAVIST GADOBUTROL 1 MMOL/ML IV SOLN COMPARISON:  06/04/2020 FINDINGS: Mild to moderate motion degradation throughout. Lower chest: Normal heart size without pericardial or pleural effusion. Interstitial thickening at the lung bases is suboptimally evaluated. Hepatobiliary: Moderate cirrhosis. Arterial phase images demonstrate numerous foci of hyperenhancement, the majority of which demonstrate concurrent mild T2 hyperintensity. None of these areas demonstrate delayed washout or capsule. The largest observation measures 3.2 cm on 64/19. Based on lesion size, this is considered LR 4. Within the posterior subcapsular right hepatic lobe at 2.2 cm on 33/19. LR 4. Smaller lesions are identified on series 19, including within the subcapsular left hepatic lobe at 1.8 cm on 40/19. LR 3. Within the more posterior left hepatic lobe 1.6 cm on 34/19.  LR 3. Pancreas:  Normal, without mass or ductal dilatation. Spleen:  Splenomegaly, 20.7 cm craniocaudal.  Adrenals/Urinary Tract: Normal adrenal glands. Tiny left renal lesions are likely cysts. Normal right kidney. Stomach/Bowel: Proximal gastric underdistention. Normal abdominal bowel loops. Vascular/Lymphatic: Aortic atherosclerosis. Patent portal and splenic veins. No abdominal adenopathy. Other:  No ascites. Musculoskeletal: No acute osseous abnormality.  IMPRESSION: 1. Mild to moderate motion degradation. 2. Multiple observations with arterial hyperenhancement, vague T2 hyperintensity, but lack of delayed washout or capsule formation. The larger lesions are considered LR 4 and the smaller lesions are considered LR 3. Given motion on the current and most recent abdominal MRI, the patient may benefit from surveillance with CT. 3. Cirrhosis and splenomegaly. 4.  Aortic Atherosclerosis (ICD10-I70.0). Electronically Signed   By: Abigail Miyamoto M.D.   On: 08/25/2020 09:30    Labs:  CBC: Recent Labs    06/08/20 0641 06/09/20 1031 06/09/20 2304 06/18/20 1541  WBC 7.3 7.2 7.3 9.4  HGB 8.0* 9.1* 8.9* 11.5*  HCT 25.5* 28.1* 27.8* 34.3*  PLT 123* 149* 161 227    COAGS: Recent Labs    06/01/20 1955 08/01/20 1623 08/18/20 1529  INR 1.1 1.1* 1.1    BMP: Recent Labs    06/23/20 1437 07/01/20 1322 07/14/20 0940 08/01/20 1416 08/18/20 1529 08/26/20 0946  NA 135 134* 135 139 138  --   K 5.8* 4.0 4.6 5.2 4.0  --   CL 103 101 99 99 101  --   CO2 19* 21* 19* 25 22  --   GLUCOSE 197* 269* 376* 268* 311* 254*  BUN 56* 23 38* 28* 21  --   CALCIUM 9.1 8.5* 8.8 9.3 9.4  --   CREATININE 1.61* 1.34* 1.57* 1.24 1.06  --   GFRNONAA 44* 55* 46* 61 73  --   GFRAA 51*  --  53* 70 85  --     LIVER FUNCTION TESTS: Recent Labs    06/10/20 0049 06/18/20 1541 08/04/20 1615 08/18/20 1529  BILITOT 1.1 0.4 0.5 0.7  AST 79* 43* 46* 36  ALT 79* 47* 43 30  ALKPHOS 148* 184* 110 143*  PROT 7.0 7.2 7.0 6.8  ALBUMIN 2.8* 4.1 4.1 4.2    TUMOR MARKERS: Recent Labs    08/01/20 1623  AFPTM 33.9*     Assessment and Plan:  Newly diagnosed hepatic cirrhosis Abdominal distension and leg swelling Liver lesions noted on recent imaging Scheduled now for liver lesion biopsy Risks and benefits of liver lesion biopsy was discussed with the patient and/or patient's family including, but not limited to bleeding, infection, damage to adjacent structures or low yield requiring additional tests.  All of the questions were answered and there is agreement to proceed.  Consent signed and in chart.   Thank you for this interesting consult.  I greatly enjoyed meeting Nicholas Rice and look forward to participating in their care.  A copy of this report was sent to the requesting provider on this date.  Electronically Signed: Lavonia Drafts, PA-C 09/15/2020, 11:35 AM   I spent a total of  30 Minutes   in face to face in clinical consultation, greater than 50% of which was counseling/coordinating care for liver lesion biopsy

## 2020-09-16 ENCOUNTER — Other Ambulatory Visit: Payer: Self-pay | Admitting: *Deleted

## 2020-09-16 ENCOUNTER — Other Ambulatory Visit: Payer: Self-pay | Admitting: Radiology

## 2020-09-16 DIAGNOSIS — E118 Type 2 diabetes mellitus with unspecified complications: Secondary | ICD-10-CM

## 2020-09-18 ENCOUNTER — Other Ambulatory Visit: Payer: Self-pay

## 2020-09-18 ENCOUNTER — Ambulatory Visit (HOSPITAL_COMMUNITY)
Admission: RE | Admit: 2020-09-18 | Discharge: 2020-09-18 | Disposition: A | Discharge status: Home / Self Care | Payer: Medicare HMO | Source: Ambulatory Visit | Attending: Gastroenterology | Admitting: Gastroenterology

## 2020-09-18 ENCOUNTER — Ambulatory Visit (HOSPITAL_COMMUNITY)
Admission: RE | Admit: 2020-09-18 | Discharge: 2020-09-18 | Disposition: A | Discharge status: Home / Self Care | Payer: Medicare HMO | Source: Ambulatory Visit | Attending: Interventional Radiology | Admitting: Interventional Radiology

## 2020-09-18 ENCOUNTER — Encounter (HOSPITAL_COMMUNITY): Payer: Self-pay

## 2020-09-18 DIAGNOSIS — K769 Liver disease, unspecified: Secondary | ICD-10-CM

## 2020-09-18 DIAGNOSIS — C220 Liver cell carcinoma: Secondary | ICD-10-CM | POA: Insufficient documentation

## 2020-09-18 DIAGNOSIS — E1140 Type 2 diabetes mellitus with diabetic neuropathy, unspecified: Secondary | ICD-10-CM | POA: Insufficient documentation

## 2020-09-18 DIAGNOSIS — K7460 Unspecified cirrhosis of liver: Secondary | ICD-10-CM | POA: Diagnosis not present

## 2020-09-18 DIAGNOSIS — K7689 Other specified diseases of liver: Secondary | ICD-10-CM | POA: Diagnosis not present

## 2020-09-18 DIAGNOSIS — D696 Thrombocytopenia, unspecified: Secondary | ICD-10-CM | POA: Diagnosis not present

## 2020-09-18 HISTORY — DX: Dyspnea, unspecified: R06.00

## 2020-09-18 LAB — COMPREHENSIVE METABOLIC PANEL
ALT: 39 U/L (ref 0–44)
AST: 43 U/L — ABNORMAL HIGH (ref 15–41)
Albumin: 4.2 g/dL (ref 3.5–5.0)
Alkaline Phosphatase: 105 U/L (ref 38–126)
Anion gap: 13 (ref 5–15)
BUN: 21 mg/dL (ref 8–23)
CO2: 20 mmol/L — ABNORMAL LOW (ref 22–32)
Calcium: 9.8 mg/dL (ref 8.9–10.3)
Chloride: 103 mmol/L (ref 98–111)
Creatinine, Ser: 0.98 mg/dL (ref 0.61–1.24)
GFR, Estimated: >60 mL/min (ref >60)
Glucose, Bld: 373 mg/dL — ABNORMAL HIGH (ref 70–99)
Potassium: 4.6 mmol/L (ref 3.5–5.1)
Sodium: 136 mmol/L (ref 135–145)
Total Bilirubin: 0.8 mg/dL (ref 0.3–1.2)
Total Protein: 7.2 g/dL (ref 6.5–8.1)

## 2020-09-18 LAB — ABO/RH: ABO/RH(D): O POS

## 2020-09-18 LAB — CBC
HCT: 36.1 % — ABNORMAL LOW (ref 39.0–52.0)
Hemoglobin: 11.9 g/dL — ABNORMAL LOW (ref 13.0–17.0)
MCH: 30.3 pg (ref 26.0–34.0)
MCHC: 33 g/dL (ref 30.0–36.0)
MCV: 91.9 fL (ref 80.0–100.0)
Platelets: 79 10*3/uL — ABNORMAL LOW (ref 150–400)
RBC: 3.93 MIL/uL — ABNORMAL LOW (ref 4.22–5.81)
RDW: 15 % (ref 11.5–15.5)
WBC: 4.2 10*3/uL (ref 4.0–10.5)
nRBC: 0 % (ref 0.0–0.2)

## 2020-09-18 LAB — TYPE AND SCREEN
ABO/RH(D): O POS
Antibody Screen: NEGATIVE

## 2020-09-18 LAB — GLUCOSE, CAPILLARY: Glucose-Capillary: 360 mg/dL — ABNORMAL HIGH (ref 70–99)

## 2020-09-18 IMAGING — US US BIOPSY CORE LIVER
1 series · 12 of 12 positions shown · non-contrast
Comparison: none

INDICATION: Indeterminate liver lesions by CT and MRI. Chronic cirrhosis,
thrombocytopenia

[Series 1: us biopsy core liver · 12 of 12 slices shown]
[im 1/12]
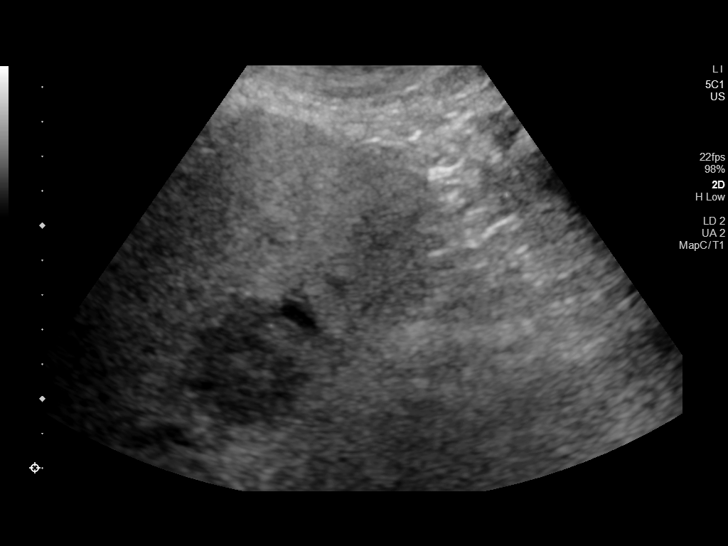
[im 2/12]
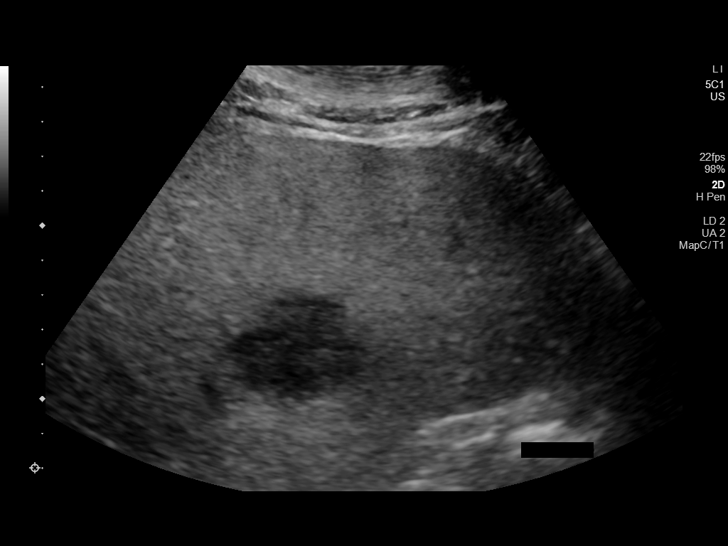
[im 3/12]
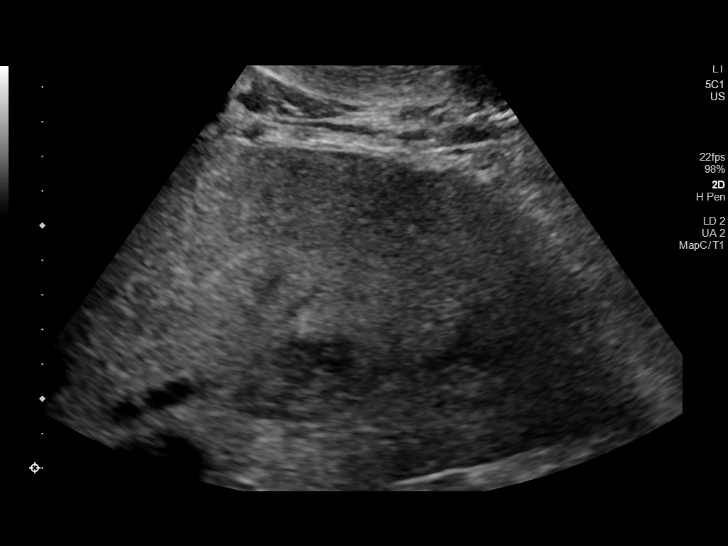
[im 4/12]
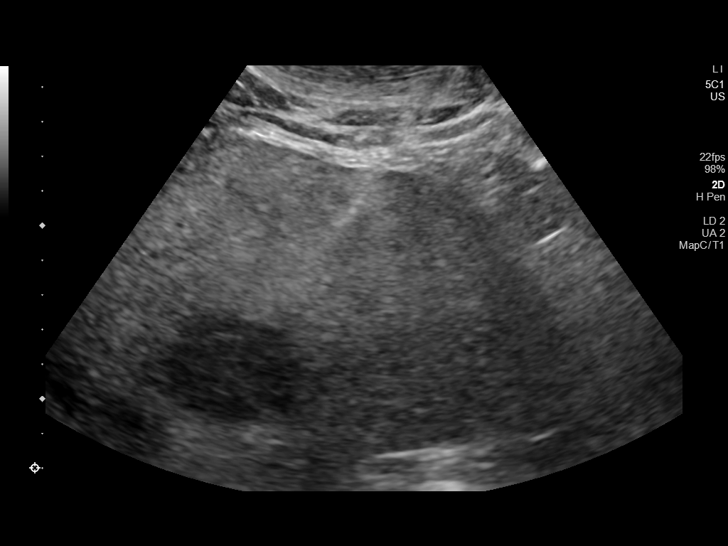
[im 5/12]
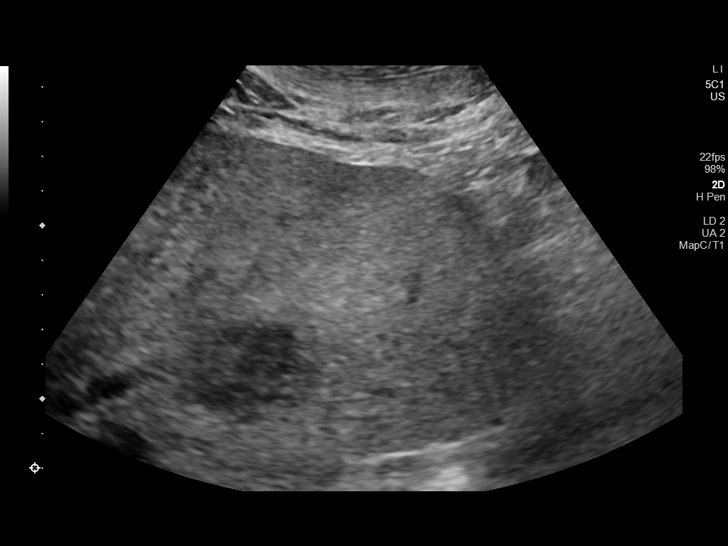
[im 6/12]
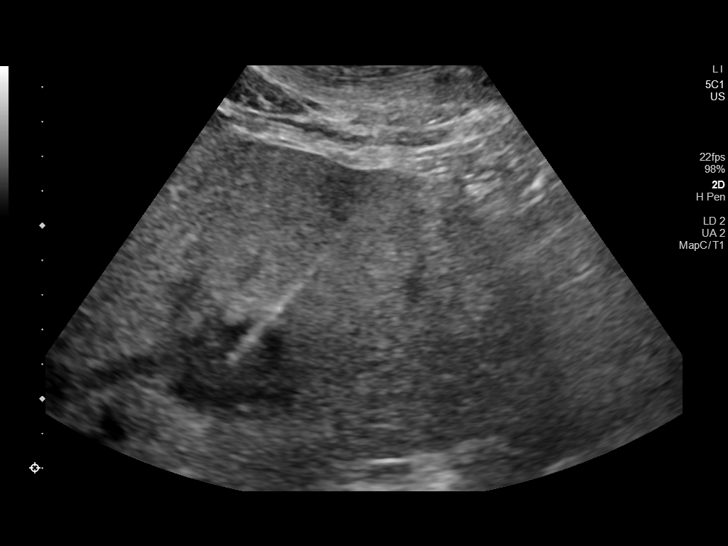
[im 7/12]
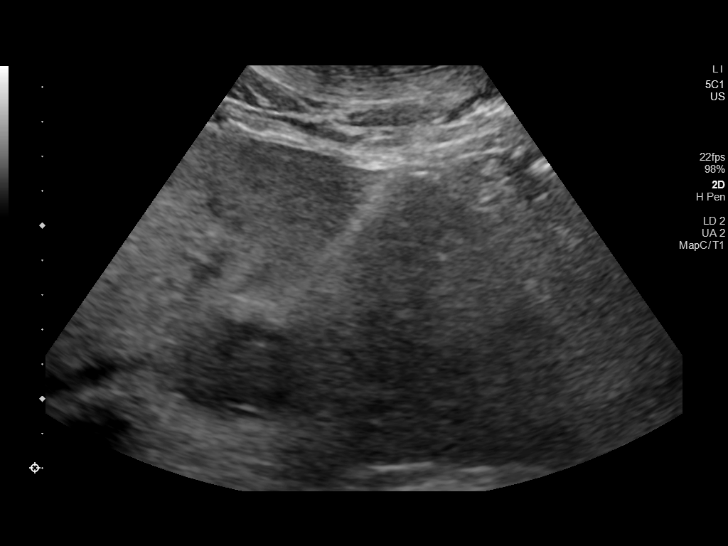
[im 8/12]
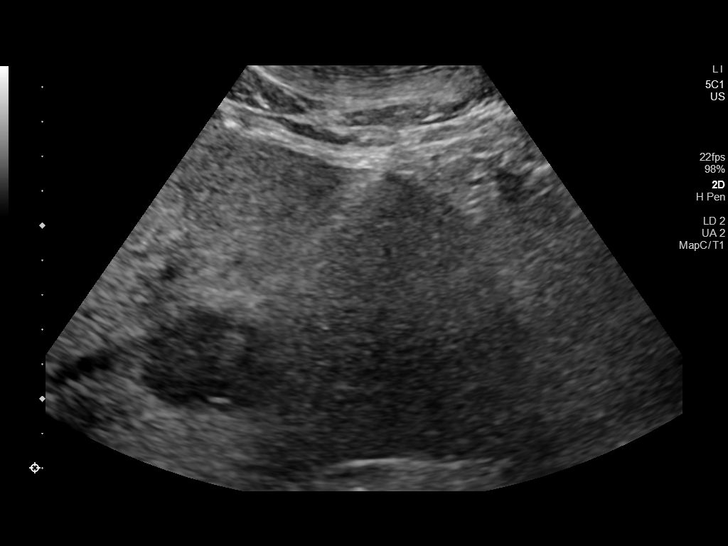
[im 9/12]
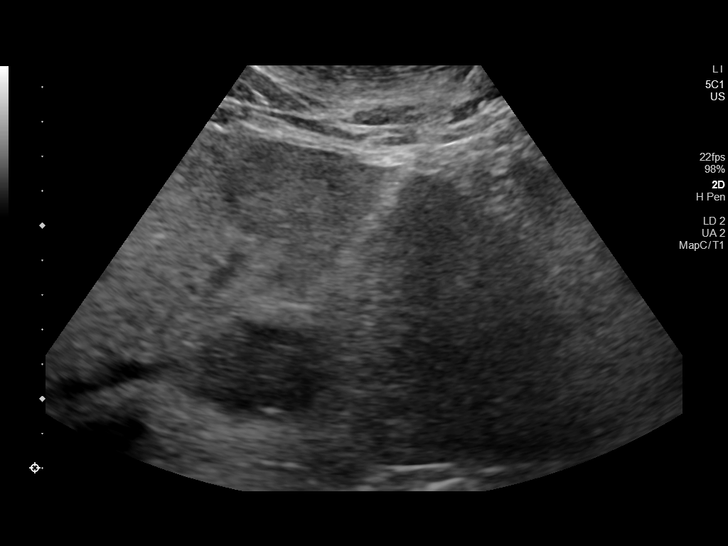
[im 10/12]
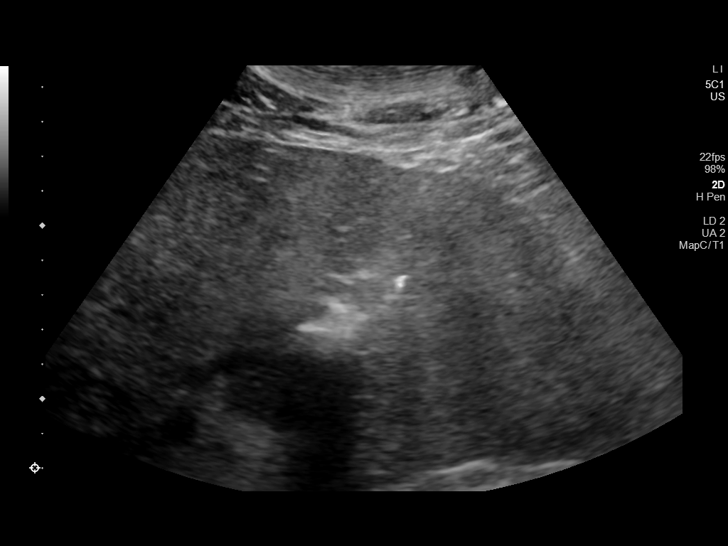
[im 11/12]
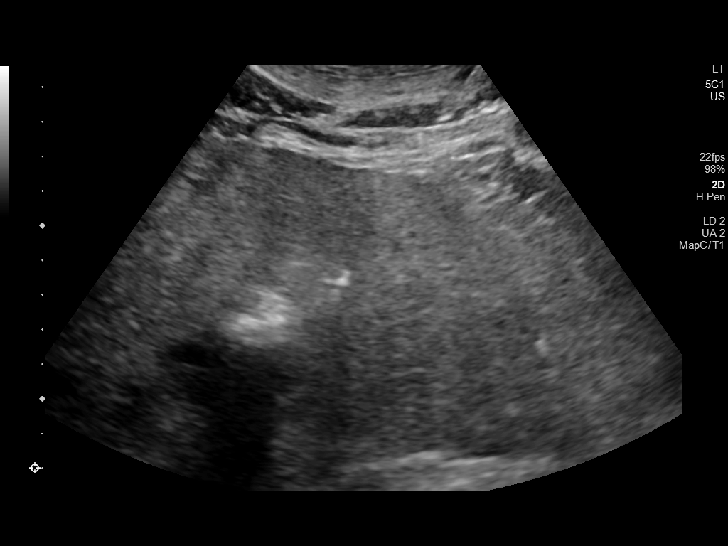
[im 12/12]
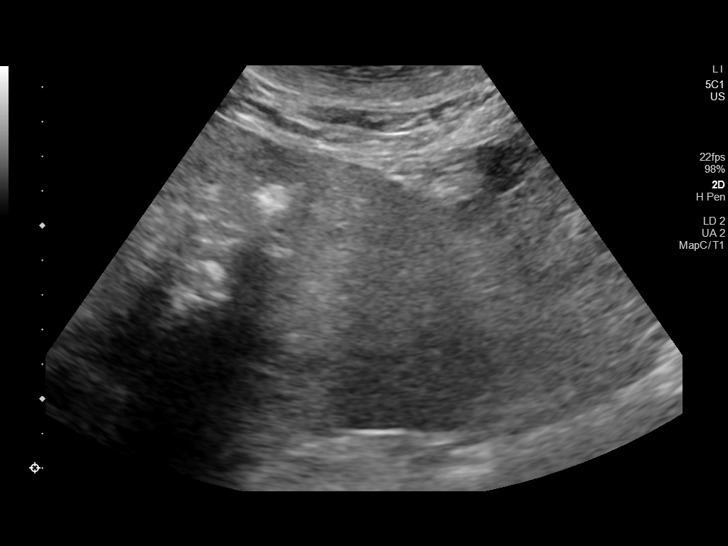

[12 of 12 positions shown; findings below may reference images not displayed]

EXAM:
ULTRASOUND CORE BIOPSY RIGHT LIVER LESION

MEDICATIONS:
1% lidocaine local

ANESTHESIA/SEDATION:
Moderate (conscious) sedation was employed during this procedure. A
total of Versed 2.0 mg and Fentanyl 100 mcg was administered
intravenously.

Moderate Sedation Time: 10 minutes. The patient's level of
consciousness and vital signs were monitored continuously by
radiology nursing throughout the procedure under my direct
supervision.

FLUOROSCOPY TIME:  Fluoroscopy Time: None.

COMPLICATIONS:
None immediate.

PROCEDURE:
Informed written consent was obtained from the patient after a
thorough discussion of the procedural risks, benefits and
alternatives. All questions were addressed. Maximal Sterile Barrier
Technique was utilized including caps, mask, sterile gowns, sterile
gloves, sterile drape, hand hygiene and skin antiseptic. A timeout
was performed prior to the initiation of the procedure.

Previous imaging reviewed. Preliminary ultrasound performed. The
hypoechoic lesion in the central right liver was localized and
marked.

Under sterile conditions and local anesthesia, a 17 gauge coaxial
guide was advanced to the right hepatic lesion. Needle position
confirmed with ultrasound. Images obtained for documentation. 3 18
gauge core biopsies obtained through the access under direct
ultrasound. Images again obtained for documentation. Samples were
intact and non fragmented. These were placed in formalin. Needle
tract occluded with Gel-Foam. Postprocedure imaging demonstrates no
hemorrhage or hematoma. Patient tolerated biopsy well.
IMPRESSION: Successful ultrasound right liver mass 18 gauge core biopsy

## 2020-09-18 MED ORDER — FENTANYL CITRATE (PF) 100 MCG/2ML IJ SOLN
INTRAMUSCULAR | Status: AC | PRN
  Administered 2020-09-18 (×2): 50 ug via INTRAVENOUS

## 2020-09-18 MED ORDER — HYDROCODONE-ACETAMINOPHEN 5-325 MG PO TABS: 1.0000 | ORAL_TABLET | Freq: Four times a day (QID) | ORAL | 0 refills | Status: AC | PRN

## 2020-09-18 MED ORDER — GELATIN ABSORBABLE 12-7 MM EX MISC
CUTANEOUS | Status: AC
  Filled 2020-09-18: qty 1

## 2020-09-18 MED ORDER — LIDOCAINE HCL (PF) 1 % IJ SOLN
INTRAMUSCULAR | Status: AC | PRN
  Administered 2020-09-18: 10 mL via INTRADERMAL

## 2020-09-18 MED ORDER — MIDAZOLAM HCL 2 MG/2ML IJ SOLN
INTRAMUSCULAR | Status: AC
  Filled 2020-09-18: qty 2

## 2020-09-18 MED ORDER — SODIUM CHLORIDE 0.9 % IV SOLN: INTRAVENOUS | Status: AC

## 2020-09-18 MED ORDER — SODIUM CHLORIDE 0.9% IV SOLUTION: Freq: Once | INTRAVENOUS | Status: AC

## 2020-09-18 MED ORDER — SODIUM CHLORIDE 0.9 % IV SOLN: INTRAVENOUS | Status: DC

## 2020-09-18 MED ORDER — MIDAZOLAM HCL 2 MG/2ML IJ SOLN
INTRAMUSCULAR | Status: AC | PRN
  Administered 2020-09-18 (×2): 1 mg via INTRAVENOUS

## 2020-09-18 MED ORDER — FENTANYL CITRATE (PF) 100 MCG/2ML IJ SOLN
INTRAMUSCULAR | Status: AC
  Filled 2020-09-18: qty 2

## 2020-09-18 MED ORDER — FENTANYL CITRATE (PF) 100 MCG/2ML IJ SOLN
25.0000 ug | Freq: Once | INTRAMUSCULAR | Status: AC
Start: 2020-09-18 — End: 2020-09-18
  Administered 2020-09-18: 15:00:00 25 ug via INTRAVENOUS

## 2020-09-18 MED ORDER — GELATIN ABSORBABLE 12-7 MM EX MISC
CUTANEOUS | Status: AC | PRN
  Administered 2020-09-18: 1 via TOPICAL

## 2020-09-18 MED ORDER — INSULIN ASPART 100 UNIT/ML ~~LOC~~ SOLN
30.0000 [IU] | Freq: Once | SUBCUTANEOUS | Status: AC
  Administered 2020-09-18: 12:00:00 30 [IU] via SUBCUTANEOUS
  Filled 2020-09-18: qty 1

## 2020-09-18 MED ORDER — LIDOCAINE HCL 1 % IJ SOLN
INTRAMUSCULAR | Status: AC
  Filled 2020-09-18: qty 20

## 2020-09-18 MED ORDER — HYDROCODONE-ACETAMINOPHEN 5-325 MG PO TABS
1.0000 | ORAL_TABLET | ORAL | Status: AC
Start: 2020-09-18 — End: 2020-09-18
  Administered 2020-09-18: 16:00:00 1 via ORAL
  Filled 2020-09-18: qty 1

## 2020-09-18 NOTE — H&P (Signed)
Referring Physician(s): Mansouraty,Gabriel Jr.  Supervising Physician: Daryll Brod  Patient Status:  [facility]  Chief Complaint:  "I'm here for a liver biopsy"  Subjective: Patient familiar to IR service from evaluation on 09/15/2020 for image guided liver lesion biopsy.  Patient was noted to be thrombocytopenic (plts 69k) at the time and biopsy was postponed until today.  He is scheduled for repeat CBC today along with possible platelet transfusion preprocedure.  He has a past medical history of anemia,  coronary artery disease, skin cancer, depression, diabetes, heart failure, enlarged prostate, GERD, hyperlipidemia, hypertension, osteoarthritis, peripheral neuropathy, proteinuria/renal insufficiency, portal hypertension, and cirrhosis/splenomegaly/liver lesions by imaging.  AFP in November of this year was 33.9.  He currently denies fever, headache, chest pain, dyspnea, nausea, vomiting.  He does have occasional cough, intermittent abdominal and back pain as well as some previous blood in stool and urine.  Past Medical History:  Diagnosis Date  . Anemia   . Benign prostatic hyperplasia   . CAD in native artery   . Cancer ([place])    basal cell  . Depression   . Diabetic neuropathy ([place])   . Diastolic heart failure ([place])   . Edema   . Enlarged prostate   . GERD (gastroesophageal reflux disease)   . Gout   . Hyperlipidemia   . Hypertension   . Hypogonadism in male   . Muscle cramp   . Osteoarthritis   . Pancytopenia ([place]) 01/02/2015  . Peripheral neuropathy   . 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 Renal insufficiency   . Stage 3 chronic kidney disease ([place])   . Type 2 diabetes with complication [facility])    Past Surgical History:  Procedure Laterality Date  . CATARACT EXTRACTION Bilateral   . CHOLECYSTECTOMY  1981  . [place]  . TONSILLECTOMY  1963     Allergies: Loratadine  Medications: Prior to Admission medications   Medication  Sig Start Date End Date Taking? Authorizing Provider  albuterol (PROVENTIL) (2.5 MG/3ML) 0.083% nebulizer solution Take 3 mLs (2.5 mg total) by nebulization every 4 (four) hours as needed for wheezing or shortness of breath. 06/10/20 06/10/21  Iona Pardy, MD  atorvastatin (LIPITOR) 40 MG tablet Take 40 mg by mouth daily.    [provider]  b complex vitamins capsule Take 1 capsule by mouth daily.    [provider]  Blood Glucose Monitoring Suppl (ONETOUCH VERIO) w/Device KIT Check blood sugar 4 times daily 07/21/20   Marianna Payment, MD  cholestyramine (QUESTRAN) 4 g packet Take 1 packet (4 g total) by mouth 3 (three) times daily with meals. 08/01/20   Mansouraty, Telford Nab., MD  colchicine 0.6 MG tablet Take 1 tablet (0.6 mg total) by mouth 2 (two) times daily. 06/23/20 12/20/20  Seawell, Jaimie A, DO  Continuous Blood Gluc Sensor (DEXCOM G6 SENSOR) MISC 1 Device by Does not apply route See admin instructions. Change every 10 days 08/26/20   Renato Shin, MD  Continuous Blood Gluc Sensor (FREESTYLE LIBRE 14 DAY SENSOR) MISC Change sensor every 14 days 09/05/20   Renato Shin, MD  diclofenac Sodium (VOLTAREN) 1 % GEL Apply 2 g topically 4 (four) times daily. Patient taking differently: Apply 2 g topically daily as needed (Knee pain). 08/01/20   Asencion Noble, MD  ferrous sulfate 325 (65 FE) MG tablet Take 650 mg by mouth daily with breakfast.    [provider]  furosemide (LASIX) 40 MG tablet  Take 1 tablet (40 mg total) by mouth daily. 08/18/20   Asencion Noble, MD  gabapentin (NEURONTIN) 600 MG tablet Take 1 tablet (600 mg total) by mouth at bedtime. 08/01/20   Asencion Noble, MD  glucose blood (ONETOUCH VERIO) test strip Check blood sugar 3 times a day as instructed 07/28/20   Marianna Payment, MD  Insulin Aspart, w/Niacinamide, (FIASP) 100 UNIT/ML SOLN Inject 60 Units into the skin 3 (three) times daily before meals. Patient taking differently: Inject 50 Units  into the skin 3 (three) times daily before meals. 08/26/20   Renato Shin, MD  insulin degludec (TRESIBA FLEXTOUCH) 200 UNIT/ML FlexTouch Pen Inject 150 Units into the skin daily. 09/05/20   Renato Shin, MD  insulin NPH Human (NOVOLIN N) 100 UNIT/ML injection Inject 150 Units into the skin at bedtime.    [provider]  Insulin Pen Needle 32G X 4 MM MISC Use to inject insulin 4 times a day 07/14/20   Lacinda Axon, MD  Insulin Syringe-Needle U-100 31G X 15/64" 1 ML MISC Use to inject insulin 1 time a day 07/14/20   Lacinda Axon, MD  Lancets [facility] DELICA PLUS [id number]) MISC Check blood sugar 3 times per day. 07/28/20   Marianna Payment, MD  losartan (COZAAR) 25 MG tablet Take 1 tablet (25 mg total) by mouth daily. 07/14/20 10/12/20  Marianna Payment, MD  MAGNESIUM OXIDE PO Take 400 mg by mouth daily. 100 mg each    [provider]  metoprolol succinate (TOPROL XL) 25 MG 24 hr tablet Take 0.5 tablets (12.5 mg total) by mouth daily. 07/14/20 10/12/20  Marianna Payment, MD  Na Sulfate-K Sulfate-Mg Sulf (SUPREP BOWEL PREP KIT) 17.5-3.13-1.6 GM/177ML SOLN Take 1 kit by mouth as directed. For colonoscopy prep 08/01/20   Mansouraty, Telford Nab., MD  pantoprazole (PROTONIX) 40 MG tablet Take 40 mg by mouth daily. 09/27/18   [provider]  sertraline (ZOLOFT) 100 MG tablet Take 200 mg by mouth daily.  02/08/20   [provider]  spironolactone (ALDACTONE) 100 MG tablet Take 1 tablet (100 mg total) by mouth daily. 08/18/20 08/18/21  Asencion Noble, MD  tamsulosin (FLOMAX) 0.4 MG CAPS capsule Take 0.4 mg by mouth daily. 09/27/18   [provider]  traMADol (ULTRAM) 50 MG tablet Take 50 mg by mouth 4 (four) times daily as needed for moderate pain or severe pain.  09/09/18   [provider]     Vital Signs: BP (!) 166/84   Pulse 80   Temp 98.2 F (36.8 C) (Oral)   Resp 18   SpO2 98%   Physical Exam awake, alert.  Chest clear to auscultation  bilaterally.  Heart with regular rate and rhythm.  Abdomen protuberant, soft, positive bowel sounds, some mild diffuse tenderness to palpation.  Bilateral pretibial edema noted.  Imaging: IR ABDOMEN US LIMITED  Result Date: 09/15/2020 CLINICAL DATA:  Indeterminate liver lesions poorly demonstrated by CT but present by MRI. Assess for biopsy EXAM: ULTRASOUND ABDOMEN LIMITED COMPARISON:  08/25/2020 FINDINGS: Limited ultrasound performed of the liver to identified the central right hepatic lesion, segment 5/6. Ultrasound of this area confirms a solid hypoechoic lesion centrally in the right hepatic lobe inferior to the portal veins which correlates with the MRI finding. Lesion is amenable to ultrasound biopsy. IMPRESSION: Central right hepatic lesion is visualized by ultrasound to perform biopsy. PLAN: Patient has cirrhosis and thrombocytopenia. Ultrasound core biopsy will be coordinated for the patient to receive platelets on arrival.  [facility] Electronically Signed   By: [name].  Shick M.D.   On: 09/15/2020 15:28    Labs:  CBC: Recent Labs    06/09/20 1031 06/09/20 2304 06/18/20 1541 09/15/20 1118  WBC 7.2 7.3 9.4 3.7*  HGB 9.1* 8.9* 11.5* 11.6*  HCT 28.1* 27.8* 34.3* 35.4*  PLT 149* 161 227 69*    COAGS: Recent Labs    06/01/20 1955 08/01/20 1623 08/18/20 1529 09/15/20 1118  INR 1.1 1.1* 1.1 1.1    BMP: Recent Labs    06/23/20 1437 07/01/20 1322 07/14/20 0940 08/01/20 1416 08/18/20 1529 08/26/20 0946  NA 135 134* 135 139 138  --   K 5.8* 4.0 4.6 5.2 4.0  --   CL 103 101 99 99 101  --   CO2 19* 21* 19* 25 22  --   GLUCOSE 197* 269* 376* 268* 311* 254*  BUN 56* 23 38* 28* 21  --   CALCIUM 9.1 8.5* 8.8 9.3 9.4  --   CREATININE 1.61* 1.34* 1.57* 1.24 1.06  --   GFRNONAA 44* 55* 46* 61 73  --   GFRAA 51*  --  53* 70 85  --     LIVER FUNCTION TESTS: Recent Labs    06/10/20 0049 06/18/20 1541 08/04/20 1615  08/18/20 1529  BILITOT 1.1 0.4 0.5 0.7  AST 79* 43* 46* 36  ALT 79* 47* 43 30  ALKPHOS 148* 184* 110 143*  PROT 7.0 7.2 7.0 6.8  ALBUMIN 2.8* 4.1 4.1 4.2    Assessment and Plan: Patient familiar to IR service from evaluation on 09/15/2020 for image guided liver lesion biopsy.  Patient was noted to be thrombocytopenic (plts 69k) at the time and biopsy was postponed until today.  He is scheduled for repeat CBC today along with possible platelet transfusion preprocedure.  He has a past medical history of anemia,  coronary artery disease, skin cancer, depression, diabetes, heart failure, enlarged prostate, GERD, hyperlipidemia, hypertension, osteoarthritis, peripheral neuropathy, proteinuria/renal insufficiency, portal hypertension, and cirrhosis/splenomegaly/liver lesions by imaging.  AFP in November of this year was 33.9. Risks and benefits of procedure was discussed with the patient  including, but not limited to bleeding, infection, damage to adjacent structures or low yield requiring additional tests.  All of the questions were answered and there is agreement to proceed.  Consent signed and in chart.  Patient's follow-up platelet count today is 79k; will administer platelets preprocedure.   Electronically Signed: D. Rowe Tomoki, PA-C 09/18/2020, 11:25 AM   I spent a total of 25 minutes at the the patient's bedside AND on the patient's hospital floor or unit, greater than 50% of which was counseling/coordinating care for image guided liver lesion biopsy

## 2020-09-18 NOTE — Procedures (Signed)
Interventional Radiology Procedure Note  Procedure: US liver mass bx    Complications: None  Estimated Blood Loss:  min  Findings: 18 g core bxs    M. Daryll Brod, MD

## 2020-09-18 NOTE — Progress Notes (Signed)
Patient ID: Nicholas Rice, male   DOB: 1956-07-23, 64 y.o.   MRN: [id number] Pt s/p US guided liver mass bx earlier today; VSS; afebrile; reports some mild/mod RUQ discomfort at puncture site ; no N/V or worsening back pain; puncture site RUQ abd appears ok with clean, intact gauze dressing, abd soft; findings d/w Dr. Annamaria Boots; will order one norco 5/325 tab now and electronically prescribe norco 5/325 1-2 tabs every 6 hrs for mod pain,#10, no refills

## 2020-09-18 NOTE — Discharge Instructions (Addendum)
Urgent needs - IR on call MD [phone]  Wound - May remove dressing and shower in 24.  Keep site clean and dry.  Replace with bandaid. Do not submerge in tub or water until site has scab.   Liver Biopsy, Care After These instructions give you information on caring for yourself after your procedure. Your doctor may also give you more specific instructions. Call your doctor if you have any problems or questions after your procedure. What can I expect after the procedure? After the procedure, it is common to have:  Pain and soreness where the biopsy was done.  Bruising around the area where the biopsy was done.  Sleepiness and be tired for a few days. Follow these instructions at home: Medicines  Take over-the-counter and prescription medicines only as told by your doctor.  If you were prescribed an antibiotic medicine, take it as told by your doctor. Do not stop taking the antibiotic even if you start to feel better.  Do not take medicines such as aspirin and ibuprofen. These medicines can thin your blood. Do not take these medicines unless your doctor tells you to take them.  If you are taking prescription pain medicine, take actions to prevent or treat constipation. Your doctor may recommend that you: ? Drink enough fluid to keep your pee (urine) clear or pale yellow. ? Take over-the-counter or prescription medicines. ? Eat foods that are high in fiber, such as fresh fruits and vegetables, whole grains, and beans. ? Limit foods that are high in fat and processed sugars, such as fried and sweet foods. Caring for your cut  Follow instructions from your doctor about how to take care of your cuts from surgery (incisions). Make sure you: ? Wash your hands with soap and water before you change your bandage (dressing). If you cannot use soap and water, use hand sanitizer. ? Change your bandage as told by your doctor. ? Leave stitches (sutures), skin glue, or skin tape (adhesive) strips  in place. They may need to stay in place for 2 weeks or longer. If tape strips get loose and curl up, you may trim the loose edges. Do not remove tape strips completely unless your doctor says it is okay.  Check your cuts every day for signs of infection. Check for: ? Redness, swelling, or more pain. ? Fluid or blood. ? Pus or a bad smell. ? Warmth.  Do not take baths, swim, or use a hot tub until your doctor says it is okay to do so. Activity  Rest at home for 1-2 days or as told by your doctor. ? Avoid sitting for a long time without moving. Get up to take short walks every 1-2 hours.  Return to your normal activities as told by your doctor. Ask what activities are safe for you.  Do not do these things in the first 24 hours: ? Drive. ? Use machinery. ? Take a bath or shower.  Do not lift more than 10 pounds (4.5 kg) or play contact sports for the first 2 weeks. General instructions   Do not drink alcohol in the first week after the procedure.  Have someone stay with you for at least 24 hours after the procedure.  Get your test results. Ask your doctor or the department that is doing the test: ? When will my results be ready? ? How will I get my results? ? What are my treatment options? ? What other tests do I need? ? What are my  next steps?  Keep all follow-up visits as told by your doctor. This is important. Contact a doctor if:  A cut bleeds and leaves more than just a small spot of blood.  A cut is red, puffs up (swells), or hurts more than before.  Fluid or something else comes from a cut.  A cut smells bad.  You have a fever or chills. Get help right away if:  You have swelling, bloating, or pain in your belly (abdomen).  You get dizzy or faint.  You have a rash.  You feel sick to your stomach (nauseous) or throw up (vomit).  You have trouble breathing, feel short of breath, or feel faint.  Your chest hurts.  You have problems talking or  seeing.  You have trouble with your balance or moving your arms or legs. Summary  After the procedure, it is common to have pain, soreness, bruising, and tiredness.  Your doctor will tell you how to take care of yourself at home. Change your bandage, take your medicines, and limit your activities as told by your doctor.  Call your doctor if you have symptoms of infection. Get help right away if your belly swells, your cut bleeds a lot, or you have trouble talking or breathing. This information is not intended to replace advice given to you by your health care provider. Make sure you discuss any questions you have with your health care provider. Document Revised: 09/23/2017 Document Reviewed: 09/23/2017 Elsevier Patient Education  [employer]. Moderate Conscious Sedation, Adult, Care After These instructions provide you with information about caring for yourself after your procedure. Your health care provider may also give you more specific instructions. Your treatment has been planned according to current medical practices, but problems sometimes occur. Call your health care provider if you have any problems or questions after your procedure. What can I expect after the procedure? After your procedure, it is common:  To feel sleepy for several hours.  To feel clumsy and have poor balance for several hours.  To have poor judgment for several hours.  To vomit if you eat too soon. Follow these instructions at home: For at least 24 hours after the procedure:  Do not: ? Participate in activities where you could fall or become injured. ? Drive. ? Use heavy machinery. ? Drink alcohol. ? Take sleeping pills or medicines that cause drowsiness. ? Make important decisions or sign legal documents. ? Take care of children on your own.  Rest. Eating and drinking  Follow the diet recommended by your health care provider.  If you vomit: ? Drink water, juice, or soup when you can  drink without vomiting. ? Make sure you have little or no nausea before eating solid foods. General instructions  Have a responsible adult stay with you until you are awake and alert.  Take over-the-counter and prescription medicines only as told by your health care provider.  If you smoke, do not smoke without supervision.  Keep all follow-up visits as told by your health care provider. This is important. Contact a health care provider if:  You keep feeling nauseous or you keep vomiting.  You feel light-headed.  You develop a rash.  You have a fever. Get help right away if:  You have trouble breathing. This information is not intended to replace advice given to you by your health care provider. Make sure you discuss any questions you have with your health care provider. Document Revised: 08/26/2017 Document Reviewed: 01/03/2016 Elsevier  Patient Education  [employer].

## 2020-09-19 LAB — GLUCOSE, CAPILLARY
Glucose-Capillary: 256 mg/dL — ABNORMAL HIGH (ref 70–99)
Glucose-Capillary: 185 mg/dL — ABNORMAL HIGH (ref 70–99)

## 2020-09-19 LAB — PREPARE PLATELET PHERESIS
Unit division: 0
Unit division: 0

## 2020-09-19 LAB — BPAM PLATELET PHERESIS
Blood Product Expiration Date: 202112252359
Blood Product Expiration Date: 202112252359
ISSUE DATE / TIME: 202112231310
ISSUE DATE / TIME: 202112231334
Unit Type and Rh: 600
Unit Type and Rh: 6200

## 2020-09-23 ENCOUNTER — Telehealth: Payer: Self-pay | Admitting: Gastroenterology

## 2020-09-23 ENCOUNTER — Telehealth: Payer: Self-pay | Admitting: *Deleted

## 2020-09-23 LAB — SURGICAL PATHOLOGY

## 2020-09-23 NOTE — Telephone Encounter (Signed)
Attempted to reach pt- [facility] spoke to him because he didn't go to his covid test- pt states he "didn't know about his covid test."  Prep instructions reviewed and covid test information is on there with the correct date and time.  He also states that he "drank my prep wrong- I drank my first bottle of Suprep at 6:00 pm last night."

## 2020-09-23 NOTE — Telephone Encounter (Signed)
No answer and mail box full will send message to My Chart that biopsy has not been read as of today and we will contact him as soon as available.

## 2020-09-23 NOTE — Telephone Encounter (Signed)
Continuation from last note- Pt isn't able to come into [facility] to have covid test today.  He is agreeable to reschedule both ECL and virtual PV- 10-07-20 at 10:00 am and ECL 10-21-20 at 2:30.  Dr. Meridee Score, Pt is concerned about his liver bx on 09-18-20.  Would you be able to call him with any updates?  He is very anxious about this.  Thanks, [employer]

## 2020-09-23 NOTE — Telephone Encounter (Signed)
Pt called inquiring about results of liver biopsy.

## 2020-09-23 NOTE — Telephone Encounter (Signed)
I spoke with the patient this afternoon.  Separate result note will be placed into the chart in regards to the findings of his liver biopsy. Okay for patient to be rescheduled for January procedures. Thank you. GM

## 2020-09-23 NOTE — Telephone Encounter (Signed)
Spoke with pt and let him know the results are not back yet. 

## 2020-09-23 NOTE — Telephone Encounter (Signed)
Pt is checking on the status of his biopsy results

## 2020-09-24 ENCOUNTER — Other Ambulatory Visit: Payer: Self-pay

## 2020-09-24 ENCOUNTER — Telehealth: Payer: Medicare HMO | Admitting: Gastroenterology

## 2020-09-24 ENCOUNTER — Encounter: Payer: Medicare HMO | Admitting: Gastroenterology

## 2020-09-24 DIAGNOSIS — C220 Liver cell carcinoma: Secondary | ICD-10-CM

## 2020-09-24 NOTE — Telephone Encounter (Signed)
Received a call from [facility], she is in the process of becoming pt's medical power of attorney. She is requesting for pt to have procedure sooner than 10/21/20. She stated that pt is still bleeding and his health seems to be deteriorating. She stated that pt's living conditions are also challenging so he has not been able to answer phone calls so she is taking care of all his medical needs. She is aware that she is not in pt's HIPAA but is requesting a call back at all possible. If not pls call pt's son Jessy at [phone], who is in pt's HIPAA.

## 2020-09-24 NOTE — Telephone Encounter (Signed)
The pt was messaged via My Chart.  He reached out to our office and was responded to via My Chart.

## 2020-09-25 ENCOUNTER — Encounter: Payer: Self-pay | Admitting: Gastroenterology

## 2020-09-29 NOTE — Telephone Encounter (Signed)
Nicholas Rice, This is the note I have in the referral for the procedure. This was documented on 08/19/20 @ 10:59am. Checked with two different reps.  If you look at the appt notes on several of his appts, it states his policy is out of network with [facility].  Per Ron @ [employer] precert npr but provider and facility out of network and pt has no out of network benefits. Ref# [id number] Per Lyn @ [employer] benefits Provider and facility out of network and pt has no out of network benefits. Ref# [id number] Attempted to call pt but vm full.

## 2020-10-01 ENCOUNTER — Telehealth: Payer: Self-pay | Admitting: Dietician

## 2020-10-01 ENCOUNTER — Other Ambulatory Visit: Payer: Self-pay

## 2020-10-01 ENCOUNTER — Encounter: Payer: Self-pay | Admitting: Gastroenterology

## 2020-10-01 NOTE — Telephone Encounter (Signed)
Thank you so much

## 2020-10-01 NOTE — Progress Notes (Signed)
Case discussed and reviewed at [facility]. 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 Oncology referral and Interventional Radiology referrals have already been in place. I'll see patient for upcoming EGD/Colonoscopy. Can discuss further if he would like to be evaluated at a [facility] in regards to Liver care ([facility]).  Nicholas Parish, MD [facility] Office # [phone]

## 2020-10-01 NOTE — Progress Notes (Signed)
The proposed treatment discussed in conference is for discussion purposes only and is not a binding recommendation.  The patients have not been physically examined, or presented with their treatment options.  Therefore, final treatment plans cannot be decided.   

## 2020-10-01 NOTE — Telephone Encounter (Signed)
Received fax from [facility] requesting order for CGM. Patient had previously tried to get CGM from [facility] but did not have [place] [employer] at that time making it unaffordable for him. I called [facility] and reinitiated CGM order. They called patient to get updated insurance information and will reprocess and contact our office if needed.

## 2020-10-03 ENCOUNTER — Telehealth: Payer: Self-pay | Admitting: Gastroenterology

## 2020-10-03 NOTE — Telephone Encounter (Signed)
Dr Rush Landmark FYI this pt is out of network with our facility. I have made him aware that he will need to find a provider and network that his insurance will cover. I also advised him that we can send all records to the in network provider.  The pt called again today and I was unable to reach him by phone.  I did resend all information to him via My Chart.  He is still on the schedule at this time but it may get cancelled due to insurance out of network.    "Per Ron @ [employer] npr but provider and facility out of network and pt has no out of network benefits. Ref# [id number] Per Lyn @ [employer] benefits Provider and facility out of network and pt has no out of network benefits. Ref# [id number]  You will need to call your insurance company and see who is in network for you.  We can send your records to that facility."

## 2020-10-05 NOTE — Telephone Encounter (Signed)
Patty, I am sorry to hear about this.  Is this a new change since the beginning of the year since we have seen him in clinic and arrange many different procedures including his liver biopsy last year?  Is he able to see [place] in clinic anymore?  Please update me after you have spoken with the patient. FYI Dr. Marianna Payment, that patient likely will need referral to Dr. Mann/Dr. Benson Norway or to [facility] gastroenterology since it looks like we can no longer see the patient at [facility] due to his insurance.  With the liver lesions being present, referral to [facility] liver/hepatology may not be unreasonable as well. I am very sorry that this is the case.  GM

## 2020-10-06 NOTE — Telephone Encounter (Signed)
Please cancel all of my appointments. I will be seeing Dr. Margaretmary Bayley with [facility]. Please fax my records to [phone]. I have an appointment on January 13th  Amy see message above the pt will be following with [facility].   Dr Vevelyn Francois. I have faxed the records as requested.

## 2020-10-06 NOTE — Telephone Encounter (Signed)
Amy can you help answer the questions that Dr Rush Landmark has about the insurance?

## 2020-10-06 NOTE — Telephone Encounter (Signed)
Ok, thank you for the update.

## 2020-10-08 ENCOUNTER — Ambulatory Visit: Payer: Medicare HMO | Admitting: Endocrinology

## 2020-10-09 ENCOUNTER — Encounter: Payer: Self-pay | Admitting: Internal Medicine

## 2020-10-09 DIAGNOSIS — K7469 Other cirrhosis of liver: Secondary | ICD-10-CM | POA: Diagnosis not present

## 2020-10-09 DIAGNOSIS — R1314 Dysphagia, pharyngoesophageal phase: Secondary | ICD-10-CM | POA: Diagnosis not present

## 2020-10-09 DIAGNOSIS — K921 Melena: Secondary | ICD-10-CM | POA: Diagnosis not present

## 2020-10-09 DIAGNOSIS — R531 Weakness: Secondary | ICD-10-CM | POA: Diagnosis not present

## 2020-10-09 DIAGNOSIS — Z1211 Encounter for screening for malignant neoplasm of colon: Secondary | ICD-10-CM | POA: Diagnosis not present

## 2020-10-09 DIAGNOSIS — C220 Liver cell carcinoma: Secondary | ICD-10-CM | POA: Diagnosis not present

## 2020-10-09 NOTE — Telephone Encounter (Signed)
Gave [facility] his [place] Medicaid information. They will run the insurance verification and call him with an update.

## 2020-10-12 DIAGNOSIS — R531 Weakness: Secondary | ICD-10-CM | POA: Diagnosis not present

## 2020-10-14 ENCOUNTER — Telehealth: Payer: Self-pay | Admitting: *Deleted

## 2020-10-14 ENCOUNTER — Other Ambulatory Visit: Payer: Self-pay | Admitting: Gastroenterology

## 2020-10-14 NOTE — Telephone Encounter (Signed)
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 I s/w Nicholas Rice and he has declined to schedule a consult states he is going to [facility]

## 2020-10-16 ENCOUNTER — Encounter: Payer: Medicare HMO | Admitting: Internal Medicine

## 2020-10-16 ENCOUNTER — Telehealth: Payer: Self-pay

## 2020-10-16 DIAGNOSIS — C220 Liver cell carcinoma: Secondary | ICD-10-CM | POA: Diagnosis not present

## 2020-10-16 DIAGNOSIS — Z794 Long term (current) use of insulin: Secondary | ICD-10-CM | POA: Diagnosis not present

## 2020-10-16 DIAGNOSIS — E119 Type 2 diabetes mellitus without complications: Secondary | ICD-10-CM | POA: Diagnosis not present

## 2020-10-16 NOTE — Telephone Encounter (Signed)
Spoke with patient's friend Lenna Sciara and she states we need to cancel his appointment with Dr. Benay Spice as he has switched his care to [facility] due to his insurance.  Appointment has been cancelled.

## 2020-10-17 DIAGNOSIS — C220 Liver cell carcinoma: Secondary | ICD-10-CM

## 2020-10-17 HISTORY — DX: Liver cell carcinoma: C22.0

## 2020-10-21 ENCOUNTER — Encounter: Payer: Medicare HMO | Admitting: Gastroenterology

## 2020-10-21 DIAGNOSIS — E1142 Type 2 diabetes mellitus with diabetic polyneuropathy: Secondary | ICD-10-CM | POA: Diagnosis not present

## 2020-10-21 DIAGNOSIS — Z794 Long term (current) use of insulin: Secondary | ICD-10-CM | POA: Diagnosis not present

## 2020-10-23 DIAGNOSIS — I872 Venous insufficiency (chronic) (peripheral): Secondary | ICD-10-CM | POA: Diagnosis not present

## 2020-10-23 DIAGNOSIS — E119 Type 2 diabetes mellitus without complications: Secondary | ICD-10-CM | POA: Diagnosis not present

## 2020-10-23 DIAGNOSIS — E1142 Type 2 diabetes mellitus with diabetic polyneuropathy: Secondary | ICD-10-CM | POA: Diagnosis not present

## 2020-10-24 DIAGNOSIS — Z01812 Encounter for preprocedural laboratory examination: Secondary | ICD-10-CM | POA: Diagnosis not present

## 2020-10-24 DIAGNOSIS — Z20822 Contact with and (suspected) exposure to covid-19: Secondary | ICD-10-CM | POA: Diagnosis not present

## 2020-10-24 DIAGNOSIS — C220 Liver cell carcinoma: Secondary | ICD-10-CM | POA: Diagnosis not present

## 2020-10-24 NOTE — Telephone Encounter (Signed)
Left message for Nicholas Rice to call [facility].

## 2020-10-28 ENCOUNTER — Ambulatory Visit: Payer: Medicare HMO | Admitting: Oncology

## 2020-10-28 DIAGNOSIS — Z794 Long term (current) use of insulin: Secondary | ICD-10-CM | POA: Diagnosis not present

## 2020-10-28 DIAGNOSIS — I10 Essential (primary) hypertension: Secondary | ICD-10-CM | POA: Diagnosis not present

## 2020-10-28 DIAGNOSIS — E785 Hyperlipidemia, unspecified: Secondary | ICD-10-CM | POA: Diagnosis not present

## 2020-10-28 DIAGNOSIS — E1142 Type 2 diabetes mellitus with diabetic polyneuropathy: Secondary | ICD-10-CM | POA: Diagnosis not present

## 2020-10-30 DIAGNOSIS — K7469 Other cirrhosis of liver: Secondary | ICD-10-CM | POA: Diagnosis not present

## 2020-10-30 DIAGNOSIS — D122 Benign neoplasm of ascending colon: Secondary | ICD-10-CM | POA: Diagnosis not present

## 2020-10-30 DIAGNOSIS — K222 Esophageal obstruction: Secondary | ICD-10-CM | POA: Diagnosis not present

## 2020-10-30 DIAGNOSIS — D124 Benign neoplasm of descending colon: Secondary | ICD-10-CM | POA: Diagnosis not present

## 2020-10-30 DIAGNOSIS — K7460 Unspecified cirrhosis of liver: Secondary | ICD-10-CM | POA: Diagnosis not present

## 2020-10-30 DIAGNOSIS — K642 Third degree hemorrhoids: Secondary | ICD-10-CM | POA: Diagnosis not present

## 2020-10-30 DIAGNOSIS — D123 Benign neoplasm of transverse colon: Secondary | ICD-10-CM | POA: Diagnosis not present

## 2020-10-30 DIAGNOSIS — K635 Polyp of colon: Secondary | ICD-10-CM | POA: Diagnosis not present

## 2020-10-30 DIAGNOSIS — K648 Other hemorrhoids: Secondary | ICD-10-CM | POA: Diagnosis not present

## 2020-10-30 DIAGNOSIS — Z1211 Encounter for screening for malignant neoplasm of colon: Secondary | ICD-10-CM | POA: Diagnosis not present

## 2020-10-30 DIAGNOSIS — K5730 Diverticulosis of large intestine without perforation or abscess without bleeding: Secondary | ICD-10-CM | POA: Diagnosis not present

## 2020-10-30 DIAGNOSIS — R1314 Dysphagia, pharyngoesophageal phase: Secondary | ICD-10-CM | POA: Diagnosis not present

## 2020-10-30 DIAGNOSIS — K921 Melena: Secondary | ICD-10-CM | POA: Diagnosis not present

## 2020-10-31 DIAGNOSIS — R161 Splenomegaly, not elsewhere classified: Secondary | ICD-10-CM | POA: Diagnosis not present

## 2020-10-31 DIAGNOSIS — D732 Chronic congestive splenomegaly: Secondary | ICD-10-CM | POA: Diagnosis not present

## 2020-10-31 DIAGNOSIS — I700 Atherosclerosis of aorta: Secondary | ICD-10-CM | POA: Diagnosis not present

## 2020-10-31 DIAGNOSIS — K7460 Unspecified cirrhosis of liver: Secondary | ICD-10-CM | POA: Diagnosis not present

## 2020-10-31 DIAGNOSIS — C220 Liver cell carcinoma: Secondary | ICD-10-CM | POA: Diagnosis not present

## 2020-10-31 DIAGNOSIS — R918 Other nonspecific abnormal finding of lung field: Secondary | ICD-10-CM | POA: Diagnosis not present

## 2020-11-03 DIAGNOSIS — R918 Other nonspecific abnormal finding of lung field: Secondary | ICD-10-CM | POA: Diagnosis not present

## 2020-11-03 DIAGNOSIS — Z794 Long term (current) use of insulin: Secondary | ICD-10-CM | POA: Diagnosis not present

## 2020-11-03 DIAGNOSIS — R600 Localized edema: Secondary | ICD-10-CM | POA: Diagnosis not present

## 2020-11-03 DIAGNOSIS — C220 Liver cell carcinoma: Secondary | ICD-10-CM | POA: Diagnosis not present

## 2020-11-03 DIAGNOSIS — E119 Type 2 diabetes mellitus without complications: Secondary | ICD-10-CM | POA: Diagnosis not present

## 2020-11-05 ENCOUNTER — Other Ambulatory Visit: Payer: Self-pay | Admitting: Surgical Oncology

## 2020-11-05 DIAGNOSIS — C220 Liver cell carcinoma: Secondary | ICD-10-CM

## 2020-11-05 DIAGNOSIS — I872 Venous insufficiency (chronic) (peripheral): Secondary | ICD-10-CM | POA: Diagnosis not present

## 2020-11-05 DIAGNOSIS — E118 Type 2 diabetes mellitus with unspecified complications: Secondary | ICD-10-CM | POA: Diagnosis not present

## 2020-11-05 DIAGNOSIS — K118 Other diseases of salivary glands: Secondary | ICD-10-CM | POA: Diagnosis not present

## 2020-11-05 DIAGNOSIS — G4733 Obstructive sleep apnea (adult) (pediatric): Secondary | ICD-10-CM | POA: Diagnosis not present

## 2020-11-05 DIAGNOSIS — I5030 Unspecified diastolic (congestive) heart failure: Secondary | ICD-10-CM | POA: Diagnosis not present

## 2020-11-06 ENCOUNTER — Other Ambulatory Visit: Payer: Self-pay

## 2020-11-06 ENCOUNTER — Ambulatory Visit
Admission: RE | Admit: 2020-11-06 | Discharge: 2020-11-06 | Disposition: A | Discharge status: Home / Self Care | Payer: Medicare HMO | Source: Ambulatory Visit | Attending: Surgical Oncology | Admitting: Surgical Oncology

## 2020-11-06 DIAGNOSIS — C220 Liver cell carcinoma: Secondary | ICD-10-CM | POA: Diagnosis not present

## 2020-11-06 DIAGNOSIS — R0600 Dyspnea, unspecified: Secondary | ICD-10-CM | POA: Insufficient documentation

## 2020-11-06 DIAGNOSIS — M109 Gout, unspecified: Secondary | ICD-10-CM | POA: Insufficient documentation

## 2020-11-06 DIAGNOSIS — C801 Malignant (primary) neoplasm, unspecified: Secondary | ICD-10-CM | POA: Insufficient documentation

## 2020-11-06 DIAGNOSIS — N289 Disorder of kidney and ureter, unspecified: Secondary | ICD-10-CM | POA: Insufficient documentation

## 2020-11-06 HISTORY — PX: IR RADIOLOGIST EVAL & MGMT: IMG5224

## 2020-11-06 NOTE — Consult Note (Signed)
Chief Complaint: Biopsy-proven hepatocellular carcinoma.  Referring Physician(s): Arredondo,Mark  History of Present Illness: Nicholas Rice is a 65 y.o. male with extensive past medical history significant for CAD, diastolic heart failure, diabetes with diet related proteinuria and neuropathy, hypertension and hyperlipidemia who is seen today in consultation telemedicine for evaluation of potential percutaneous management of biopsy-proven hepatocellular carcinoma.  Conversations were held with the patient's healthcare power of attorney, Nicholas Rice, a friend of the patient as his family is not involved with his care.  Patient was found to have indeterminate lesion within the liver on renal ultrasound performed 06/01/2020 confirmed on abdominal CT performed 06/03/2020 and further evaluated on abdominal MRI performed 11/06/2019. Follow-up abdominal MRI on 08/25/2020 which demonstrated multiple (greater than 4) indeterminate though worrisome liver lesions for which the patient underwent ultrasound-guided liver lesion a biopsy of dominant lesion within the right lobe of the liver on 09/18/2020.  Subsequent PET/CT was performed 10/31/2020 which was negative for evidence of extrahepatic metastatic disease (note, incidental note was made of presumably unrelated hypermetabolic bilateral parotid gland nodules).  Unfortunately, the patient has multiple social limitations including almost being homeless several months ago.  Fortunately, Nicholas Rice has stepped in and serves as a stabilizing force in the patient's life however he continues to be very dependent on her for medical decision-making.  Patient has had several recent episodes of congestive heart failure and lower extremity swelling.   Past Medical History:  Diagnosis Date  . Anemia   . Benign prostatic hyperplasia   . CAD in native artery   . Cancer ([place])    basal cell  . Depression   . Diabetic neuropathy ([place])   . Diastolic heart failure ([place])    . Dyspnea   . Edema   . Enlarged prostate   . GERD (gastroesophageal reflux disease)   . Gout   . Hyperlipidemia   . Hypertension   . Hypogonadism in male   . Muscle cramp   . Osteoarthritis   . Pancytopenia ([place]) 01/02/2015  . Peripheral neuropathy   . Proteinuria due to type 2 diabetes mellitus ([place])   . Renal insufficiency   . Stage 3 chronic kidney disease ([place])   . Type 2 diabetes with complication [facility])     Past Surgical History:  Procedure Laterality Date  . CATARACT EXTRACTION Bilateral   . CHOLECYSTECTOMY  1981  . [place]  . TONSILLECTOMY  1963    Allergies: Loratadine  Medications: Prior to Admission medications   Medication Sig Start Date End Date Taking? Authorizing Provider  albuterol (PROVENTIL) (2.5 MG/3ML) 0.083% nebulizer solution Take 3 mLs (2.5 mg total) by nebulization every 4 (four) hours as needed for wheezing or shortness of breath. 06/10/20 06/10/21  Iona Pehl, MD  atorvastatin (LIPITOR) 40 MG tablet Take 40 mg by mouth daily.    [provider]  b complex vitamins capsule Take 1 capsule by mouth daily.    [provider]  Blood Glucose Monitoring Suppl (ONETOUCH VERIO) w/Device KIT Check blood sugar 4 times daily 07/21/20   Marianna Payment, MD  cholestyramine (QUESTRAN) 4 g packet Take 1 packet (4 g total) by mouth 3 (three) times daily with meals. 08/01/20   Mansouraty, Telford Nab., MD  colchicine 0.6 MG tablet Take 1 tablet (0.6 mg total) by mouth 2 (two) times daily. 06/23/20 12/20/20  Seawell, Jaimie A, DO  Continuous Blood Gluc Sensor (DEXCOM G6 SENSOR) MISC 1 Device by Does not apply route See admin instructions. Change  every 10 days 08/26/20   Renato Shin, MD  Continuous Blood Gluc Sensor (FREESTYLE LIBRE 14 DAY SENSOR) MISC Change sensor every 14 days 09/05/20   Renato Shin, MD  diclofenac Sodium (VOLTAREN) 1 % GEL Apply 2 g topically 4 (four) times daily. Patient taking differently: Apply 2 g topically  daily as needed (Knee pain). 08/01/20   Asencion Noble, MD  ferrous sulfate 325 (65 FE) MG tablet Take 650 mg by mouth daily with breakfast.    [provider]  furosemide (LASIX) 40 MG tablet Take 1 tablet (40 mg total) by mouth daily. 08/18/20   Asencion Noble, MD  gabapentin (NEURONTIN) 600 MG tablet Take 1 tablet (600 mg total) by mouth at bedtime. 08/01/20   Asencion Noble, MD  glucose blood [facility] VERIO) test strip Check blood sugar 3 times a day as instructed 07/28/20   Marianna Payment, MD  HYDROcodone-acetaminophen (NORCO) 5-325 MG tablet Take 1-2 tablets by mouth every 6 (six) hours as needed for moderate pain. 09/18/20   Allred, Darrell K, PA-C  Insulin Aspart, w/Niacinamide, (FIASP) 100 UNIT/ML SOLN Inject 60 Units into the skin 3 (three) times daily before meals. 08/26/20   Renato Shin, MD  insulin degludec (TRESIBA FLEXTOUCH) 200 UNIT/ML FlexTouch Pen Inject 150 Units into the skin daily. 09/05/20   Renato Shin, MD  insulin NPH Human (NOVOLIN N) 100 UNIT/ML injection Inject 150 Units into the skin at bedtime.    [provider]  Insulin Pen Needle 32G X 4 MM MISC Use to inject insulin 4 times a day 07/14/20   Lacinda Axon, MD  Insulin Syringe-Needle U-100 31G X 15/64" 1 ML MISC Use to inject insulin 1 time a day 07/14/20   Lacinda Axon, MD  Lancets [facility] DELICA PLUS [id number]) MISC Check blood sugar 3 times per day. 07/28/20   Marianna Payment, MD  losartan (COZAAR) 25 MG tablet Take 1 tablet (25 mg total) by mouth daily. 07/14/20 10/12/20  Marianna Payment, MD  MAGNESIUM OXIDE PO Take 400 mg by mouth daily. 100 mg each    [provider]  metoprolol succinate (TOPROL XL) 25 MG 24 hr tablet Take 0.5 tablets (12.5 mg total) by mouth daily. 07/14/20 10/12/20  Marianna Payment, MD  Na Sulfate-K Sulfate-Mg Sulf (SUPREP BOWEL PREP KIT) 17.5-3.13-1.6 GM/177ML SOLN Take 1 kit by mouth as directed. For colonoscopy prep 08/01/20   Mansouraty, Telford Nab., MD  pantoprazole (PROTONIX) 40 MG tablet Take 40 mg by mouth daily. 09/27/18   [provider]  sertraline (ZOLOFT) 100 MG tablet Take 200 mg by mouth daily.  02/08/20   [provider]  spironolactone (ALDACTONE) 100 MG tablet Take 1 tablet (100 mg total) by mouth daily. 08/18/20 08/18/21  Asencion Noble, MD  tamsulosin (FLOMAX) 0.4 MG CAPS capsule Take by mouth daily. 09/27/18   [provider]  traMADol (ULTRAM) 50 MG tablet Take 50 mg by mouth 4 (four) times daily as needed for moderate pain or severe pain.  09/09/18   [provider]     Family History  Problem Relation Age of Onset  . Diabetes Mother   . Emphysema Mother   . Congestive Heart Failure Mother   . Breast cancer Mother   . Diabetes Father   . Heart disease Father   . Heart attack Father   . Colon cancer Neg Hx   . Stomach cancer Neg Hx   . Pancreatic cancer Neg Hx   . Esophageal cancer  Neg Hx   . Inflammatory bowel disease Neg Hx   . Liver disease Neg Hx   . Rectal cancer Neg Hx     Social History   Socioeconomic History  . Marital status: Single    Spouse name: Not on file  . Number of children: 2  . Years of education: Not on file  . Highest education level: 10th grade  Occupational History  . Not on file  Tobacco Use  . Smoking status: Former Research scientist (life sciences)  . Smokeless tobacco: Never Used  . Tobacco comment: quit 2001  Vaping Use  . Vaping Use: Never used  Substance and Sexual Activity  . Alcohol use: Never  . Drug use: Never  . Sexual activity: Not on file  Other Topics Concern  . Not on file  Social History Narrative   07/14/20 Lives alone   Caffeine- sodas 3 daily   Social Determinants of Health   Financial Resource Strain: Not on file  Food Insecurity: Not on file  Transportation Needs: Not on file  Physical Activity: Not on file  Stress: Not on file  Social Connections: Not on file    ECOG Status: 1 - Symptomatic but completely  ambulatory  Review of Systems  Review of Systems: A 12 point ROS discussed and pertinent positives are indicated in the HPI above.  All other systems are negative.  Physical Exam No direct physical exam was performed (except for noted visual exam findings with Video Visits).   Vital Signs: There were no vitals taken for this visit.  Imaging:  Following examinations were reviewed in detail: Renal ultrasound-06/01/2020 CT abdomen and pelvis-06/03/2020 Abdominal MRI-06/05/2020; 08/25/2020 Ultrasound-guided liver lesion biopsy-09/18/2020  Labs:  CBC: Recent Labs    06/09/20 2304 06/18/20 1541 09/15/20 1118 09/18/20 1156  WBC 7.3 9.4 3.7* 4.2  HGB 8.9* 11.5* 11.6* 11.9*  HCT 27.8* 34.3* 35.4* 36.1*  PLT 161 227 69* 79*    COAGS: Recent Labs    06/01/20 1955 08/01/20 1623 08/18/20 1529 09/15/20 1118  INR 1.1 1.1* 1.1 1.1    BMP: Recent Labs    06/23/20 1437 07/01/20 1322 07/14/20 0940 08/01/20 1416 08/18/20 1529 08/26/20 0946 09/18/20 1156  NA 135   < > 135 139 138  --  136  K 5.8*   < > 4.6 5.2 4.0  --  4.6  CL 103   < > 99 99 101  --  103  CO2 19*   < > 19* 25 22  --  20*  GLUCOSE 197*   < > 376* 268* 311* 254* 373*  BUN 56*   < > 38* 28* 21  --  21  CALCIUM 9.1   < > 8.8 9.3 9.4  --  9.8  CREATININE 1.61*   < > 1.57* 1.24 1.06  --  0.98  GFRNONAA 44*   < > 46* 61 73  --  >60  GFRAA 51*  --  53* 70 85  --   --    < > = values in this interval not displayed.    LIVER FUNCTION TESTS: Recent Labs    06/18/20 1541 08/04/20 1615 08/18/20 1529 09/18/20 1156  BILITOT 0.4 0.5 0.7 0.8  AST 43* 46* 36 43*  ALT 47* 43 30 39  ALKPHOS 184* 110 143* 105  PROT 7.2 7.0 6.8 7.2  ALBUMIN 4.1 4.1 4.2 4.2    TUMOR MARKERS: Recent Labs    08/01/20 1623  AFPTM 33.9*    Assessment and Plan:  Kyng Matlock is a 65 y.o. male with extensive past medical history significant for CAD, diastolic heart failure, diabetes with diet related proteinuria and neuropathy,  hypertension and hyperlipidemia who is seen today in consultation telemedicine for evaluation of potential percutaneous management of biopsy-proven hepatocellular carcinoma.  Most recent laboratory values are from 10/24/2020 with normal though mildly elevated creatinine of 2 and normal bilirubin of 0.7.  Given the evidence of multifocal disease on abdominal MRI performed 08/25/2020, the patient is NOT a candidate for ablative technology.  While technically, the patient may be a candidate for Y 90 radioembolization, I am concerned that the patient's declining mental status as well as multiple social limitations that render him a poor candidate for Y 90 radioembolization.  Prolonged conversations were held with Nicholas Rice regarding Y 90 radioembolization, including that this procedure is not curative but rather performed with palliative intent.  I explained that complications associated with the procedure include contrast and radiation exposure, renal insufficiency, bleeding/vascular injury, nontarget embolization and worsening hepatic function. I explained that the procedure entails a safety/mapping procedure and, as he has bilobar disease, require two separate treatments of the right and left lobes of the liver.  While all procedures are performed with conscious sedation on an outpatient basis, it is ideal to have someone stay with the patient (at least) the night of the procedure.  Unfortunately, the patient does not have social support to provide for recovery from these procedures.  As such, I would recommend surveillance cirrhotic protocol CT scan in 3 months (early May 2022) where me may better evaluate disease progression as well as patient may have achieved a higher level of social stability (Nicholas Rice more heavy involvement in this life has been a recent welcome stabilizing force in the patient's life).  Note, CT scan performed in lieu of abdominal MRI as there was significant respiratory artifact on  abdominal MRI performed 08/25/2020.  Above was discussed at great length with the patient's healthcare power of attorney, Nicholas Rice, who is in agreement with the proposed plan of care.  PLAN: - Follow-up telemedicine consultation in 3 months following acquisition of surveillance cirrhotic protocol (early May 2022) as well as CMP and AFP levels.  Thank you for this interesting consult.  I greatly enjoyed meeting Nicholas Rice and look forward to participating in their care.  A copy of this report was sent to the requesting provider on this date.  Electronically Signed: Sandi Mariscal 11/06/2020, 8:49 AM   I spent a total of 30 Minutes in remote  clinical consultation, greater than 50% of which was counseling/coordinating care for biopsy proven HCC.    Visit type: Audio only (telephone). Audio (no video) only due to patient's lack of internet/smartphone capability. Alternative for in-person consultation at [facility], [address], [place], [place]. This visit type was conducted due to national recommendations for restrictions regarding the COVID-19 Pandemic (e.g. social distancing).  This format is felt to be most appropriate for this patient at this time.  All issues noted in this document were discussed and addressed.

## 2020-11-09 DIAGNOSIS — R531 Weakness: Secondary | ICD-10-CM | POA: Diagnosis not present

## 2020-11-10 DIAGNOSIS — C220 Liver cell carcinoma: Secondary | ICD-10-CM | POA: Diagnosis not present

## 2020-11-10 DIAGNOSIS — E119 Type 2 diabetes mellitus without complications: Secondary | ICD-10-CM | POA: Diagnosis not present

## 2020-11-10 DIAGNOSIS — R600 Localized edema: Secondary | ICD-10-CM | POA: Diagnosis not present

## 2020-11-10 DIAGNOSIS — I509 Heart failure, unspecified: Secondary | ICD-10-CM | POA: Diagnosis not present

## 2020-11-10 DIAGNOSIS — Z794 Long term (current) use of insulin: Secondary | ICD-10-CM | POA: Diagnosis not present

## 2020-11-12 DIAGNOSIS — R531 Weakness: Secondary | ICD-10-CM | POA: Diagnosis not present

## 2020-11-18 ENCOUNTER — Ambulatory Visit: Payer: Medicare HMO | Admitting: Cardiology

## 2020-11-18 DIAGNOSIS — Z87891 Personal history of nicotine dependence: Secondary | ICD-10-CM | POA: Diagnosis not present

## 2020-11-18 DIAGNOSIS — D110 Benign neoplasm of parotid gland: Secondary | ICD-10-CM | POA: Diagnosis not present

## 2020-11-18 DIAGNOSIS — K118 Other diseases of salivary glands: Secondary | ICD-10-CM | POA: Diagnosis not present

## 2020-11-18 DIAGNOSIS — C220 Liver cell carcinoma: Secondary | ICD-10-CM | POA: Diagnosis not present

## 2020-11-24 ENCOUNTER — Telehealth: Payer: Self-pay | Admitting: *Deleted

## 2020-11-24 NOTE — Telephone Encounter (Addendum)
PA for One Touch Ultra Strips sent through CoverMyMeds awaiting determination within 24-72 Hours.  Sander Nephew, RN 11/24/2020 9:44 AM PA for One Touch Strips was approved for 12 months at 4 strips per day. 11/24/2020 thru 11/24/2021.   Sander Nephew, RN 11/25/2020 10:59 AM.

## 2020-11-28 DIAGNOSIS — K118 Other diseases of salivary glands: Secondary | ICD-10-CM | POA: Diagnosis not present

## 2020-11-28 DIAGNOSIS — Z87891 Personal history of nicotine dependence: Secondary | ICD-10-CM | POA: Diagnosis not present

## 2020-11-28 DIAGNOSIS — D110 Benign neoplasm of parotid gland: Secondary | ICD-10-CM | POA: Diagnosis not present

## 2020-12-05 DIAGNOSIS — I5030 Unspecified diastolic (congestive) heart failure: Secondary | ICD-10-CM | POA: Diagnosis not present

## 2020-12-05 DIAGNOSIS — E118 Type 2 diabetes mellitus with unspecified complications: Secondary | ICD-10-CM | POA: Diagnosis not present

## 2020-12-05 DIAGNOSIS — G4733 Obstructive sleep apnea (adult) (pediatric): Secondary | ICD-10-CM | POA: Diagnosis not present

## 2020-12-05 DIAGNOSIS — C220 Liver cell carcinoma: Secondary | ICD-10-CM | POA: Diagnosis not present

## 2020-12-05 DIAGNOSIS — I872 Venous insufficiency (chronic) (peripheral): Secondary | ICD-10-CM | POA: Diagnosis not present

## 2020-12-05 DIAGNOSIS — E785 Hyperlipidemia, unspecified: Secondary | ICD-10-CM | POA: Diagnosis not present

## 2020-12-05 DIAGNOSIS — N183 Chronic kidney disease, stage 3 unspecified: Secondary | ICD-10-CM | POA: Diagnosis not present

## 2020-12-05 DIAGNOSIS — I2510 Atherosclerotic heart disease of native coronary artery without angina pectoris: Secondary | ICD-10-CM | POA: Diagnosis not present

## 2020-12-05 DIAGNOSIS — I110 Hypertensive heart disease with heart failure: Secondary | ICD-10-CM | POA: Diagnosis not present

## 2020-12-07 DIAGNOSIS — R531 Weakness: Secondary | ICD-10-CM | POA: Diagnosis not present

## 2020-12-09 ENCOUNTER — Other Ambulatory Visit: Payer: Self-pay | Admitting: Internal Medicine

## 2020-12-09 ENCOUNTER — Telehealth: Payer: Self-pay

## 2020-12-09 DIAGNOSIS — E118 Type 2 diabetes mellitus with unspecified complications: Secondary | ICD-10-CM

## 2020-12-09 DIAGNOSIS — I10 Essential (primary) hypertension: Secondary | ICD-10-CM

## 2020-12-09 DIAGNOSIS — N1832 Chronic kidney disease, stage 3b: Secondary | ICD-10-CM

## 2020-12-09 NOTE — Telephone Encounter (Signed)
Pls contact [place] at [employer]

## 2020-12-09 NOTE — Telephone Encounter (Signed)
Return call to [facility], [place] - stated she needs a new rx for pen needles with directions. Informed by looking at current med list, there has not been any new rxs written this year. Stated it was written per Dr Mellody Memos. Then I noticed under "significant history" TRANSFERRED CARE TO [facility]-NOT [facility] PATIENT-10/10/20 I also called the pt - Melissa B answered the phone (stated currently filling out POA papers) confirmed pt has transferred his care. I asked her to call the pharmacy back - stated she will.

## 2020-12-10 DIAGNOSIS — R531 Weakness: Secondary | ICD-10-CM | POA: Diagnosis not present

## 2020-12-17 ENCOUNTER — Other Ambulatory Visit: Payer: Self-pay | Admitting: Interventional Radiology

## 2020-12-17 ENCOUNTER — Other Ambulatory Visit: Payer: Self-pay | Admitting: Hematology and Oncology

## 2020-12-17 DIAGNOSIS — C220 Liver cell carcinoma: Secondary | ICD-10-CM

## 2020-12-17 DIAGNOSIS — I509 Heart failure, unspecified: Secondary | ICD-10-CM | POA: Diagnosis not present

## 2020-12-17 DIAGNOSIS — E119 Type 2 diabetes mellitus without complications: Secondary | ICD-10-CM | POA: Diagnosis not present

## 2020-12-17 DIAGNOSIS — K635 Polyp of colon: Secondary | ICD-10-CM | POA: Diagnosis not present

## 2020-12-17 DIAGNOSIS — Z794 Long term (current) use of insulin: Secondary | ICD-10-CM | POA: Diagnosis not present

## 2020-12-17 DIAGNOSIS — M25512 Pain in left shoulder: Secondary | ICD-10-CM | POA: Diagnosis not present

## 2020-12-22 ENCOUNTER — Other Ambulatory Visit (HOSPITAL_COMMUNITY): Payer: Medicare HMO

## 2020-12-23 ENCOUNTER — Other Ambulatory Visit: Payer: Self-pay

## 2020-12-23 ENCOUNTER — Encounter: Payer: Self-pay | Admitting: *Deleted

## 2020-12-23 ENCOUNTER — Ambulatory Visit
Admission: RE | Admit: 2020-12-23 | Discharge: 2020-12-23 | Disposition: A | Discharge status: Home / Self Care | Payer: Medicare HMO | Source: Ambulatory Visit | Attending: Hematology and Oncology | Admitting: Hematology and Oncology

## 2020-12-23 DIAGNOSIS — C220 Liver cell carcinoma: Secondary | ICD-10-CM | POA: Diagnosis not present

## 2020-12-23 HISTORY — PX: IR RADIOLOGIST EVAL & MGMT: IMG5224

## 2020-12-23 NOTE — Progress Notes (Signed)
Patient ID: Nicholas Rice, male   DOB: 1956/04/05, 65 y.o.   MRN: [id number]        Chief Complaint: Biopsy-proven hepatocellular carcinoma.  Referring Physician(s): Chinnasami,Bernard Arredondo,Mark  History of Present Illness: Nicholas Rice is a 65 y.o. male with extensive past medical history significant for CAD, diastolic heart failure, diabetes with diet related proteinuria and neuropathy, hypertension and hyperlipidemia who was initially seen in consultation on 11/06/2020 for evaluation of potential percutaneous management of biopsy-proven hepatocellular carcinoma. Note, conversations were held with the patient's healthcare power of attorney, Nicholas Rice, a friend of the patient as his family is not involved with his care.  In review, the patient was found to have indeterminate lesion within the liver on renal ultrasound performed 06/01/2020 confirmed on abdominal CT performed 06/03/2020 and further evaluated on abdominal MRI performed 11/06/2019. Follow-up abdominal MRI on 08/25/2020 which demonstrated multiple (greater than 4) indeterminate though worrisome liver lesions for which the patient underwent ultrasound-guided liver lesion a biopsy of dominant lesion within the right lobe of the liver on 09/18/2020.  Subsequent PET/CT was performed 10/31/2020 which was negative for evidence of extrahepatic metastatic disease (note, incidental note was made of presumably unrelated hypermetabolic bilateral parotid gland nodules for which he underwent ultrasound-guided left parotid nodule biopsies performed 11/18/2020 and 11/28/2020 with pathology compatible with Warthin's tumor).  As above, initial consultation was held on 11/06/2020 however given the patient's multiple social limitations, including almost being homeless several months ago, the decision was made to pursue surveillance imaging in view of attempted treatment  Since that time, the patient's social situation has stabilized and has been re-referred by  Dr. Wendee Beavers for consideration of percutaneous management.  Note, the patient continues to struggle with recurrent episodes of congestive heart failure and lower extremity swelling.    Past Medical History:  Diagnosis Date  . Anemia   . Aortic stenosis 06/17/2020  . Benign prostatic hyperplasia   . CAD in native artery   . Cancer ([place])    basal cell  . CAP (community acquired pneumonia) 06/02/2020  . Daytime somnolence 06/19/2020  . Depression   . Diabetic neuropathy ([place])   . Diastolic heart failure ([place])   . Dyspnea   . Edema   . Effusion of lower leg joint 06/02/2020  . Enlarged prostate   . GERD (gastroesophageal reflux disease)   . Gout   . Hepatic cirrhosis ([place]) 06/10/2020  . Hepatocellular carcinoma ([place]) 10/17/2020  . History of colonic polyps 08/02/2020  . Hyperkalemia 06/24/2020  . Hyperlipidemia   . Hypertension   . Hypogonadism in male   . Joint pain   . Liver lesion 08/02/2020  . Muscle cramp   . Obstructive sleep apnea 07/15/2020  . Osteoarthritis   . Pancytopenia ([place]) 01/02/2015  . Peripheral neuropathy   . Proteinuria due to type 2 diabetes mellitus ([place])   . Rectal bleeding 08/02/2020  . Rectal urgency 08/02/2020  . Renal insufficiency   . Stage 3 chronic kidney disease ([place])   . Type 2 diabetes with complication ([place])   . URI (upper respiratory infection) 07/15/2020  . Venous insufficiency 06/19/2020    Past Surgical History:  Procedure Laterality Date  . CATARACT EXTRACTION Bilateral   . CHOLECYSTECTOMY  1981  . IR RADIOLOGIST EVAL & MGMT  11/06/2020  . [place]  . TONSILLECTOMY  1963    Allergies: Loratadine  Medications: Prior to Admission medications   Medication Sig Start Date End Date Taking? Authorizing Provider  albuterol (PROVENTIL) (  2.5 MG/3ML) 0.083% nebulizer solution Take 3 mLs (2.5 mg total) by nebulization every 4 (four) hours as needed for wheezing or shortness of breath. 06/10/20 06/10/21  Iona Bias, MD   aspirin 81 MG chewable tablet Chew 1 tablet by mouth daily.    [provider]  atorvastatin (LIPITOR) 40 MG tablet Take 40 mg by mouth daily.    [provider]  b complex vitamins capsule Take 1 capsule by mouth daily.    [provider]  Blood Glucose Monitoring Suppl (ONETOUCH VERIO) w/Device KIT Check blood sugar 4 times daily 07/21/20   Marianna Payment, MD  cholestyramine (QUESTRAN) 4 g packet Take 1 packet (4 g total) by mouth 3 (three) times daily with meals. 08/01/20   Mansouraty, Telford Nab., MD  colchicine 0.6 MG tablet Take 1 tablet (0.6 mg total) by mouth 2 (two) times daily. 06/23/20 12/20/20  Seawell, Jaimie A, DO  Continuous Blood Gluc Sensor (DEXCOM G6 SENSOR) MISC 1 Device by Does not apply route See admin instructions. Change every 10 days 08/26/20   Renato Shin, MD  Continuous Blood Gluc Sensor (FREESTYLE LIBRE 14 DAY SENSOR) MISC Change sensor every 14 days 09/05/20   Renato Shin, MD  diclofenac Sodium (VOLTAREN) 1 % GEL Apply 2 g topically 4 (four) times daily. Patient taking differently: Apply 2 g topically daily as needed (Knee pain). 08/01/20   Asencion Noble, MD  ferrous sulfate 325 (65 FE) MG tablet Take 650 mg by mouth daily with breakfast.    [provider]  furosemide (LASIX) 40 MG tablet Take 1 tablet (40 mg total) by mouth daily. 08/18/20   Asencion Noble, MD  gabapentin (NEURONTIN) 600 MG tablet Take 1 tablet (600 mg total) by mouth at bedtime. 08/01/20   Asencion Noble, MD  glucose blood [facility] VERIO) test strip Check blood sugar 3 times a day as instructed 07/28/20   Marianna Payment, MD  HYDROcodone-acetaminophen (NORCO) 5-325 MG tablet Take 1-2 tablets by mouth every 6 (six) hours as needed for moderate pain. 09/18/20   Allred, Darrell K, PA-C  Insulin Aspart, w/Niacinamide, (FIASP) 100 UNIT/ML SOLN Inject 60 Units into the skin 3 (three) times daily before meals. 08/26/20   Renato Shin, MD  insulin degludec  (TRESIBA FLEXTOUCH) 200 UNIT/ML FlexTouch Pen Inject 150 Units into the skin daily. 09/05/20   Renato Shin, MD  insulin NPH Human (NOVOLIN N) 100 UNIT/ML injection Inject 150 Units into the skin at bedtime.    [provider]  Insulin Pen Needle 32G X 4 MM MISC Use to inject insulin 4 times a day 07/14/20   Lacinda Axon, MD  Insulin Syringe-Needle U-100 31G X 15/64" 1 ML MISC Use to inject insulin 1 time a day 07/14/20   Lacinda Axon, MD  Lancets [facility] DELICA PLUS [id number]) MISC Check blood sugar 3 times per day. 07/28/20   Marianna Payment, MD  losartan (COZAAR) 25 MG tablet Take 1 tablet (25 mg total) by mouth daily. 07/14/20 10/12/20  Marianna Payment, MD  MAGNESIUM OXIDE PO Take 400 mg by mouth daily. 100 mg each    [provider]  meloxicam (MOBIC) 15 MG tablet Take 15 mg by mouth daily. 10/30/20   [provider]  metoprolol succinate (TOPROL XL) 25 MG 24 hr tablet Take 0.5 tablets (12.5 mg total) by mouth daily. 07/14/20 10/12/20  Marianna Payment, MD  Na Sulfate-K Sulfate-Mg Sulf (SUPREP BOWEL PREP KIT) 17.5-3.13-1.6 GM/177ML SOLN Take 1 kit by mouth as  directed. For colonoscopy prep 08/01/20   Mansouraty, Telford Nab., MD  pantoprazole (PROTONIX) 40 MG tablet Take 40 mg by mouth daily. 09/27/18   [provider]  sertraline (ZOLOFT) 100 MG tablet Take 200 mg by mouth daily.  02/08/20   [provider]  spironolactone (ALDACTONE) 100 MG tablet Take 1 tablet (100 mg total) by mouth daily. 08/18/20 08/18/21  Asencion Noble, MD  tamsulosin (FLOMAX) 0.4 MG CAPS capsule Take by mouth daily. 09/27/18   [provider]  traMADol (ULTRAM) 50 MG tablet Take 50 mg by mouth 4 (four) times daily as needed for moderate pain or severe pain.  09/09/18   [provider]     Family History  Problem Relation Age of Onset  . Diabetes Mother   . Emphysema Mother   . Congestive Heart Failure Mother   . Breast cancer Mother   . Diabetes  Father   . Heart disease Father   . Heart attack Father   . Colon cancer Neg Hx   . Stomach cancer Neg Hx   . Pancreatic cancer Neg Hx   . Esophageal cancer Neg Hx   . Inflammatory bowel disease Neg Hx   . Liver disease Neg Hx   . Rectal cancer Neg Hx     Social History   Socioeconomic History  . Marital status: Single    Spouse name: Not on file  . Number of children: 2  . Years of education: Not on file  . Highest education level: 10th grade  Occupational History  . Not on file  Tobacco Use  . Smoking status: Former Research scientist (life sciences)  . Smokeless tobacco: Never Used  . Tobacco comment: quit 2001  Vaping Use  . Vaping Use: Never used  Substance and Sexual Activity  . Alcohol use: Never  . Drug use: Never  . Sexual activity: Not on file  Other Topics Concern  . Not on file  Social History Narrative   07/14/20 Lives alone   Caffeine- sodas 3 daily   Social Determinants of Health   Financial Resource Strain: Not on file  Food Insecurity: Not on file  Transportation Needs: Not on file  Physical Activity: Not on file  Stress: Not on file  Social Connections: Not on file    ECOG Status: 1 - Symptomatic but completely ambulatory  Review of Systems  Review of Systems: A 12 point ROS discussed and pertinent positives are indicated in the HPI above.  All other systems are negative.  Physical Exam No direct physical exam was performed (except for noted visual exam findings with Video Visits).   Vital Signs: There were no vitals taken for this visit.  Imaging: No results found.  Labs:  CBC: Recent Labs    06/09/20 2304 06/18/20 1541 09/15/20 1118 09/18/20 1156  WBC 7.3 9.4 3.7* 4.2  HGB 8.9* 11.5* 11.6* 11.9*  HCT 27.8* 34.3* 35.4* 36.1*  PLT 161 227 69* 79*    COAGS: Recent Labs    06/01/20 1955 08/01/20 1623 08/18/20 1529 09/15/20 1118  INR 1.1 1.1* 1.1 1.1    BMP: Recent Labs    06/23/20 1437 07/01/20 1322 07/14/20 0940 08/01/20 1416  08/18/20 1529 08/26/20 0946 09/18/20 1156  NA 135   < > 135 139 138  --  136  K 5.8*   < > 4.6 5.2 4.0  --  4.6  CL 103   < > 99 99 101  --  103  CO2 19*   < >  19* 25 22  --  20*  GLUCOSE 197*   < > 376* 268* 311* 254* 373*  BUN 56*   < > 38* 28* 21  --  21  CALCIUM 9.1   < > 8.8 9.3 9.4  --  9.8  CREATININE 1.61*   < > 1.57* 1.24 1.06  --  0.98  GFRNONAA 44*   < > 46* 61 73  --  >60  GFRAA 51*  --  53* 70 85  --   --    < > = values in this interval not displayed.    LIVER FUNCTION TESTS: Recent Labs    06/18/20 1541 08/04/20 1615 08/18/20 1529 09/18/20 1156  BILITOT 0.4 0.5 0.7 0.8  AST 43* 46* 36 43*  ALT 47* 43 30 39  ALKPHOS 184* 110 143* 105  PROT 7.2 7.0 6.8 7.2  ALBUMIN 4.1 4.1 4.2 4.2    TUMOR MARKERS: Recent Labs    08/01/20 1623  AFPTM 33.9*      Assessment and Plan:  Nicholas Rice is a 65 y.o. male with extensive past medical history significant for CAD, diastolic heart failure, diabetes with diet related proteinuria and neuropathy, hypertension and hyperlipidemia who is seen today in consultation telemedicine for evaluation of potential percutaneous management of biopsy-proven hepatocellular carcinoma.  Most recent laboratory values are from 12/17/2020 demonstrating a normal bilirubin of 0.7 as well as a normal creatinine of 1.3.  Unfortunately, the patient's AFP is found to be markedly elevated at 791, previously, 34 on 08/11/2020.  Per my discussion with the patient, he feels that his social situation is now stabilized as he is better able to rely on his family members in addition to his close family friend, Melissa.  As such, prolonged conversations were held with the patient regarding potential percutaneous treatment options.  Given the evidence of multifocal disease on abdominal MRI performed 08/25/2020, the patient is NOT a candidate for ablative technology.  As such, prolonged conversations were held with the patient regarding Y 90  radioembolization, including that this procedure is not curative but rather performed with palliative intent.  I explained that complications associated with the procedure include contrast and radiation exposure, renal insufficiency, bleeding/vascular injury, nontarget embolization and worsening hepatic function. I explained that the procedure entails a safety/mapping procedure and, as he has bilobar disease, would require two separate treatments of the right and left lobes of the liver.  While all procedures are performed with conscious sedation on an outpatient basis, it is ideal to have someone stay with the patient (at least) the night of the procedure which the patient states he has now been able to arrange.  As such, we will initially proceed with obtaining a multi phase cirrhotic protocol CT scan of the abdomen and pelvis to better evaluate the extent of his disease as well for procedural planning purposes.  Patient demonstrated fair to poor understanding of this conversation however knows to call the interventional radiology clinic with any interval questions or concerns.  PLAN: - Follow-up telemedicine consultation following acquisition of multi phase cirrhotic protocol CT scan of the abdomen and pelvis to better evaluate the extent of his disease as well as for procedural planning purposes.  A copy of this report was sent to the requesting provider on this date.  Electronically Signed: Sandi Mariscal 12/23/2020, 12:03 PM   I spent a total of 15 Minutes in remote  clinical consultation, greater than 50% of which was counseling/coordinating care for biopsy-proven hepatocellular carcinoma.  Visit type: Audio only (telephone). Audio (no video) only due to patient's lack of internet/smartphone capability. Alternative for in-person consultation at [facility], [address], [place], [place]. This visit type was conducted due to national recommendations for restrictions regarding  the COVID-19 Pandemic (e.g. social distancing).  This format is felt to be most appropriate for this patient at this time.  All issues noted in this document were discussed and addressed.

## 2020-12-29 DIAGNOSIS — Z794 Long term (current) use of insulin: Secondary | ICD-10-CM | POA: Diagnosis not present

## 2020-12-29 DIAGNOSIS — E785 Hyperlipidemia, unspecified: Secondary | ICD-10-CM | POA: Diagnosis not present

## 2020-12-29 DIAGNOSIS — I10 Essential (primary) hypertension: Secondary | ICD-10-CM | POA: Diagnosis not present

## 2020-12-29 DIAGNOSIS — E1142 Type 2 diabetes mellitus with diabetic polyneuropathy: Secondary | ICD-10-CM | POA: Diagnosis not present

## 2021-01-01 ENCOUNTER — Ambulatory Visit (HOSPITAL_COMMUNITY): Admission: RE | Admit: 2021-01-01 | Payer: Medicare HMO | Source: Ambulatory Visit

## 2021-01-01 ENCOUNTER — Other Ambulatory Visit: Payer: Self-pay | Admitting: Interventional Radiology

## 2021-01-01 DIAGNOSIS — R2243 Localized swelling, mass and lump, lower limb, bilateral: Secondary | ICD-10-CM | POA: Diagnosis not present

## 2021-01-01 DIAGNOSIS — C220 Liver cell carcinoma: Secondary | ICD-10-CM | POA: Diagnosis not present

## 2021-01-01 DIAGNOSIS — K625 Hemorrhage of anus and rectum: Secondary | ICD-10-CM | POA: Diagnosis not present

## 2021-01-01 DIAGNOSIS — R600 Localized edema: Secondary | ICD-10-CM | POA: Diagnosis not present

## 2021-01-07 DIAGNOSIS — R531 Weakness: Secondary | ICD-10-CM | POA: Diagnosis not present

## 2021-01-10 DIAGNOSIS — R531 Weakness: Secondary | ICD-10-CM | POA: Diagnosis not present

## 2021-01-12 DIAGNOSIS — K7469 Other cirrhosis of liver: Secondary | ICD-10-CM | POA: Diagnosis not present

## 2021-01-12 DIAGNOSIS — K7689 Other specified diseases of liver: Secondary | ICD-10-CM | POA: Diagnosis not present

## 2021-01-12 DIAGNOSIS — C220 Liver cell carcinoma: Secondary | ICD-10-CM | POA: Diagnosis not present

## 2021-01-12 DIAGNOSIS — I700 Atherosclerosis of aorta: Secondary | ICD-10-CM | POA: Diagnosis not present

## 2021-01-21 ENCOUNTER — Encounter: Payer: Self-pay | Admitting: *Deleted

## 2021-01-21 ENCOUNTER — Other Ambulatory Visit: Payer: Self-pay

## 2021-01-21 ENCOUNTER — Ambulatory Visit
Admission: RE | Admit: 2021-01-21 | Discharge: 2021-01-21 | Disposition: A | Discharge status: Home / Self Care | Payer: Medicare HMO | Source: Ambulatory Visit | Attending: Interventional Radiology | Admitting: Interventional Radiology

## 2021-01-21 DIAGNOSIS — C220 Liver cell carcinoma: Secondary | ICD-10-CM

## 2021-01-21 HISTORY — PX: IR RADIOLOGIST EVAL & MGMT: IMG5224

## 2021-01-21 NOTE — Progress Notes (Signed)
Patient ID: Nicholas Rice, male   DOB: 08/04/1956, 64 y.o.   MRN: [id number]         Chief Complaint: Biopsy-proven hepatocellular carcinoma.  Referring Physician(s): Chinnasami,Bernard Arredondo,Mark  History of Present Illness: Nicholas Rice a 65 y.o.malewith extensive past medical history significant for CAD, diastolic heart failure, diabetes with diet related proteinuria and neuropathy, hypertension and hyperlipidemia who was initially seen in consultation on 11/06/2020 for evaluation of potential percutaneous management of biopsy-proven hepatocellular carcinoma. Note, conversations were held with the patient's healthcare power of attorney, Nicholas Rice,afriend of the patient as his family is not involved with his care.  In review, the patient was found to have indeterminate lesion within the liver on renal ultrasound performed 06/01/2020 confirmed on abdominal CT performed 06/03/2020 and further evaluated on abdominal MRI performed 11/06/2019.Follow-up abdominal MRI on 08/25/2020 which demonstrated multiple (greater than 4) indeterminate though worrisome liver lesions for which the patient underwent ultrasound-guided liver lesion a biopsy of dominant lesion within the right lobe of the liver on 09/18/2020.  Subsequent PET/CT was performed 10/31/2020 which was negative for evidence of extrahepatic metastatic disease (note, incidental note was made of presumably unrelated hypermetabolic bilateral parotid gland nodules for which he underwent ultrasound-guided left parotid nodule biopsies performed 11/18/2020 and 11/28/2020 with pathology compatible with Warthin's tumor).  As above, initial consultation was held on 11/06/2020 however given the patient's multiple social limitations, including almost being homeless several months ago, the decision was made to pursue surveillance imaging in view of attempted treatment  Since that time, the patient's social situation has stabilized and was re-referred by  Dr. Wendee Beavers for consideration of percutaneous management with follow-up consultation performed on 12/23/2020 at which time it was decided to proceed with evaluation for potential Y 90 radioembolization.  Telemedicine consultation is held today following acquisition of planning CTA of the abdomen and pelvis performed 01/12/2021  Since this time, the patient has established care with the [facility] and is transferring all subsequent treatment to the [place] per my discussion with his healthcare power of attorney, Nicholas Rice.     Past Medical History:  Diagnosis Date  . Anemia   . Aortic stenosis 06/17/2020  . Benign prostatic hyperplasia   . CAD in native artery   . Cancer ([place])    basal cell  . CAP (community acquired pneumonia) 06/02/2020  . Daytime somnolence 06/19/2020  . Depression   . Diabetic neuropathy ([place])   . Diastolic heart failure ([place])   . Dyspnea   . Edema   . Effusion of lower leg joint 06/02/2020  . Enlarged prostate   . GERD (gastroesophageal reflux disease)   . Gout   . Hepatic cirrhosis ([place]) 06/10/2020  . Hepatocellular carcinoma ([place]) 10/17/2020  . History of colonic polyps 08/02/2020  . Hyperkalemia 06/24/2020  . Hyperlipidemia   . Hypertension   . Hypogonadism in male   . Joint pain   . Liver lesion 08/02/2020  . Muscle cramp   . Obstructive sleep apnea 07/15/2020  . Osteoarthritis   . Pancytopenia ([place]) 01/02/2015  . Peripheral neuropathy   . Proteinuria due to type 2 diabetes mellitus ([place])   . Rectal bleeding 08/02/2020  . Rectal urgency 08/02/2020  . Renal insufficiency   . Stage 3 chronic kidney disease ([place])   . Type 2 diabetes with complication ([place])   . URI (upper respiratory infection) 07/15/2020  . Venous insufficiency 06/19/2020    Past Surgical History:  Procedure Laterality Date  . CATARACT EXTRACTION Bilateral   . CHOLECYSTECTOMY  1981  . IR RADIOLOGIST EVAL & MGMT  11/06/2020  . IR RADIOLOGIST EVAL & MGMT  12/23/2020  . [place]  .  TONSILLECTOMY  1963    Allergies: Loratadine  Medications: Prior to Admission medications   Medication Sig Start Date End Date Taking? Authorizing Provider  albuterol (PROVENTIL) (2.5 MG/3ML) 0.083% nebulizer solution Take 3 mLs (2.5 mg total) by nebulization every 4 (four) hours as needed for wheezing or shortness of breath. 06/10/20 06/10/21  Iona Fatula, MD  aspirin 81 MG chewable tablet Chew 1 tablet by mouth daily.    [provider]  atorvastatin (LIPITOR) 40 MG tablet Take 40 mg by mouth daily.    [provider]  b complex vitamins capsule Take 1 capsule by mouth daily.    [provider]  Blood Glucose Monitoring Suppl (ONETOUCH VERIO) w/Device KIT Check blood sugar 4 times daily 07/21/20   Marianna Payment, MD  cholestyramine (QUESTRAN) 4 g packet Take 1 packet (4 g total) by mouth 3 (three) times daily with meals. 08/01/20   Mansouraty, Telford Nab., MD  colchicine 0.6 MG tablet Take 1 tablet (0.6 mg total) by mouth 2 (two) times daily. 06/23/20 12/20/20  Seawell, Jaimie A, DO  Continuous Blood Gluc Sensor (DEXCOM G6 SENSOR) MISC 1 Device by Does not apply route See admin instructions. Change every 10 days 08/26/20   Renato Shin, MD  Continuous Blood Gluc Sensor (FREESTYLE LIBRE 14 DAY SENSOR) MISC Change sensor every 14 days 09/05/20   Renato Shin, MD  diclofenac Sodium (VOLTAREN) 1 % GEL Apply 2 g topically 4 (four) times daily. Patient taking differently: Apply 2 g topically daily as needed (Knee pain). 08/01/20   Asencion Noble, MD  ferrous sulfate 325 (65 FE) MG tablet Take 650 mg by mouth daily with breakfast.    [provider]  furosemide (LASIX) 40 MG tablet Take 1 tablet (40 mg total) by mouth daily. 08/18/20   Asencion Noble, MD  gabapentin (NEURONTIN) 600 MG tablet Take 1 tablet (600 mg total) by mouth at bedtime. 08/01/20   Asencion Noble, MD  glucose blood [facility] VERIO) test strip Check blood sugar 3 times a day as  instructed 07/28/20   Marianna Payment, MD  HYDROcodone-acetaminophen (NORCO) 5-325 MG tablet Take 1-2 tablets by mouth every 6 (six) hours as needed for moderate pain. 09/18/20   Allred, Darrell K, PA-C  Insulin Aspart, w/Niacinamide, (FIASP) 100 UNIT/ML SOLN Inject 60 Units into the skin 3 (three) times daily before meals. 08/26/20   Renato Shin, MD  insulin degludec (TRESIBA FLEXTOUCH) 200 UNIT/ML FlexTouch Pen Inject 150 Units into the skin daily. 09/05/20   Renato Shin, MD  insulin NPH Human (NOVOLIN N) 100 UNIT/ML injection Inject 150 Units into the skin at bedtime.    [provider]  Insulin Pen Needle 32G X 4 MM MISC Use to inject insulin 4 times a day 07/14/20   Lacinda Axon, MD  Insulin Syringe-Needle U-100 31G X 15/64" 1 ML MISC Use to inject insulin 1 time a day 07/14/20   Lacinda Axon, MD  Lancets [facility] DELICA PLUS [id number]) MISC Check blood sugar 3 times per day. 07/28/20   Marianna Payment, MD  losartan (COZAAR) 25 MG tablet Take 1 tablet (25 mg total) by mouth daily. 07/14/20 10/12/20  Marianna Payment, MD  MAGNESIUM OXIDE PO Take 400 mg by mouth daily. 100 mg each    [provider]  meloxicam (MOBIC) 15 MG  tablet Take 15 mg by mouth daily. 10/30/20   [provider]  metoprolol succinate (TOPROL XL) 25 MG 24 hr tablet Take 0.5 tablets (12.5 mg total) by mouth daily. 07/14/20 10/12/20  Marianna Payment, MD  Na Sulfate-K Sulfate-Mg Sulf (SUPREP BOWEL PREP KIT) 17.5-3.13-1.6 GM/177ML SOLN Take 1 kit by mouth as directed. For colonoscopy prep 08/01/20   Mansouraty, Telford Nab., MD  pantoprazole (PROTONIX) 40 MG tablet Take 40 mg by mouth daily. 09/27/18   [provider]  sertraline (ZOLOFT) 100 MG tablet Take 200 mg by mouth daily.  02/08/20   [provider]  spironolactone (ALDACTONE) 100 MG tablet Take 1 tablet (100 mg total) by mouth daily. 08/18/20 08/18/21  Asencion Noble, MD  tamsulosin (FLOMAX) 0.4 MG CAPS capsule Take by mouth  daily. 09/27/18   [provider]  traMADol (ULTRAM) 50 MG tablet Take 50 mg by mouth 4 (four) times daily as needed for moderate pain or severe pain.  09/09/18   [provider]     Family History  Problem Relation Age of Onset  . Diabetes Mother   . Emphysema Mother   . Congestive Heart Failure Mother   . Breast cancer Mother   . Diabetes Father   . Heart disease Father   . Heart attack Father   . Colon cancer Neg Hx   . Stomach cancer Neg Hx   . Pancreatic cancer Neg Hx   . Esophageal cancer Neg Hx   . Inflammatory bowel disease Neg Hx   . Liver disease Neg Hx   . Rectal cancer Neg Hx     Social History   Socioeconomic History  . Marital status: Single    Spouse name: Not on file  . Number of children: 2  . Years of education: Not on file  . Highest education level: 10th grade  Occupational History  . Not on file  Tobacco Use  . Smoking status: Former Research scientist (life sciences)  . Smokeless tobacco: Never Used  . Tobacco comment: quit 2001  Vaping Use  . Vaping Use: Never used  Substance and Sexual Activity  . Alcohol use: Never  . Drug use: Never  . Sexual activity: Not on file  Other Topics Concern  . Not on file  Social History Narrative   07/14/20 Lives alone   Caffeine- sodas 3 daily   Social Determinants of Health   Financial Resource Strain: Not on file  Food Insecurity: Not on file  Transportation Needs: Not on file  Physical Activity: Not on file  Stress: Not on file  Social Connections: Not on file    ECOG Status: 1 - Symptomatic but completely ambulatory  Review of Systems  Review of Systems: A 12 point ROS discussed and pertinent positives are indicated in the HPI above.  All other systems are negative.  Physical Exam No direct physical exam was performed (except for noted visual exam findings with Video Visits).   Vital Signs: There were no vitals taken for this visit.  Imaging:  Abdominal MRI - 08/25/2020 - findings worrisome for  bilobar disease. Ultrasound-guided liver mass biopsy - 09/18/2020 (biopsy was performed of the right lobe of the liver. PET/CT - 10/31/2020 no evidence of extrahepatic metastatic disease.  (Note is made of bilateral FDG avid parotid nodules)  CTA abdomen and pelvis - 01/12/2021 - demonstrates multiple ill-defined liver lesions compatible with known multiple hepatocellular carcinoma.  This finding is associated with stigmata of portal venous hypertension including partial recanalization of the periumbilical  vein as well as splenomegaly.  No significant intra-abdominal ascites. 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   Conventional hepatic arterial anatomy.  Moderate amount of atherosclerotic plaque within a normal caliber abdominal aorta.  Note is made of several new pulmonary nodules, largest of which within the right lower lobe measures approximately 1 cm in diameter (image 15, series 5), new compared to PET/CT performed 10/31/2018 and worrisome progression of metastatic disease.  IR Radiologist Eval & Mgmt  Result Date: 12/23/2020 Please refer to notes tab for details about interventional procedure. (Op Note)   Labs:  CBC: Recent Labs    06/09/20 2304 06/18/20 1541 09/15/20 1118 09/18/20 1156  WBC 7.3 9.4 3.7* 4.2  HGB 8.9* 11.5* 11.6* 11.9*  HCT 27.8* 34.3* 35.4* 36.1*  PLT 161 227 69* 79*    COAGS: Recent Labs    06/01/20 1955 08/01/20 1623 08/18/20 1529 09/15/20 1118  INR 1.1 1.1* 1.1 1.1    BMP: Recent Labs    06/23/20 1437 07/01/20 1322 07/14/20 0940 08/01/20 1416 08/18/20 1529 08/26/20 0946 09/18/20 1156  NA 135   < > 135 139 138  --  136  K 5.8*   < > 4.6 5.2 4.0  --  4.6  CL 103   < > 99 99 101  --  103  CO2 19*   < > 19* 25 22  --  20*  GLUCOSE 197*   < > 376* 268* 311* 254* 373*  BUN 56*   < > 38* 28* 21  --  21  CALCIUM 9.1   < > 8.8 9.3 9.4  --  9.8  CREATININE 1.61*   < > 1.57* 1.24 1.06  --  0.98  GFRNONAA 44*   < > 46* 61 73  --   >60  GFRAA 51*  --  53* 70 85  --   --    < > = values in this interval not displayed.    LIVER FUNCTION TESTS: Recent Labs    06/18/20 1541 08/04/20 1615 08/18/20 1529 09/18/20 1156  BILITOT 0.4 0.5 0.7 0.8  AST 43* 46* 36 43*  ALT 47* 43 30 39  ALKPHOS 184* 110 143* 105  PROT 7.2 7.0 6.8 7.2  ALBUMIN 4.1 4.1 4.2 4.2   Labs from 4/7:      TUMOR MARKERS: Recent Labs    08/01/20 1623  AFPTM 33.9*     Assessment and Plan:  Nicholas Rice a 65 y.o.malewith extensive past medical history significant for CAD, diastolic heart failure, diabetes with diet related proteinuria and neuropathy, hypertension and hyperlipidemia who was initially seen in consultation on 11/06/2020 for evaluation of potential percutaneous management of biopsy-proven hepatocellular carcinoma.  Telemedicine consultation is held today following acquisition of planning CTA of the abdomen and pelvis performed 01/12/2021.  Personal review of planning CTA demonstrates development of several new pulmonary nodules, largest of which within the right lower lobe measures approximately 1 cm in diameter (image 15, series 5), new compared to PET/CT performed 10/31/2018 and worrisome progression of metastatic disease.  Given findings worrisome for rapid progression of metastatic disease I was planning on proceeding with diagnostic chest CT however per my discussion with the patient's healthcare power of attorney, Nicholas Rice, the patient has established care at the [facility] and has already undergone a CT at that facility and they are aware of his development of the pulmonary nodules.  As such, I will ensure that the referring oncologist, Dr. Wendee Beavers, is aware the patient  is transferring his care to the [place] as well as development of pulmonary nodules but at this time I do NOT feel that dedicated hepatic directed therapy is warranted given his rapid progression of disease.  The patient and Nicholas Rice, the patient's healthcare power  of attorney, know to call the interventional radiology clinic with any future questions or concerns though may otherwise follow-up on a PRN basis.  A copy of this report was sent to the requesting provider on this date.  Electronically Signed: Sandi Mariscal 01/21/2021, 10:06 AM   I spent a total of 15 Minutes in remote  clinical consultation, greater than 50% of which was counseling/coordinating care for biopsy-proven multifocal hepatocellular carcinoma.    Visit type: Audio only (telephone). Audio (no video) only due to patient's lack of internet/smartphone capability. Alternative for in-person consultation at [facility], [address], [place], [place]. This visit type was conducted due to national recommendations for restrictions regarding the COVID-19 Pandemic (e.g. social distancing).  This format is felt to be most appropriate for this patient at this time.  All issues noted in this document were discussed and addressed.

## 2021-02-06 DIAGNOSIS — R531 Weakness: Secondary | ICD-10-CM | POA: Diagnosis not present

## 2021-02-09 DIAGNOSIS — R531 Weakness: Secondary | ICD-10-CM | POA: Diagnosis not present

## 2021-05-20 NOTE — Telephone Encounter (Signed)
Chart review.
